# Patient Record
Sex: Female | Born: 1940 | Race: White | Hispanic: No | Smoking: Current every day smoker
Health system: Southern US, Community
[De-identification: ages and names within clinical notes are randomized; demographics above are authoritative.]

## PROBLEM LIST (undated history)

## (undated) ENCOUNTER — Ambulatory Visit: Admission: EM | Source: Home / Self Care

## (undated) DIAGNOSIS — I10 Essential (primary) hypertension: Secondary | ICD-10-CM

## (undated) DIAGNOSIS — E119 Type 2 diabetes mellitus without complications: Secondary | ICD-10-CM

## (undated) DIAGNOSIS — M539 Dorsopathy, unspecified: Secondary | ICD-10-CM

## (undated) DIAGNOSIS — K219 Gastro-esophageal reflux disease without esophagitis: Secondary | ICD-10-CM

## (undated) DIAGNOSIS — E785 Hyperlipidemia, unspecified: Secondary | ICD-10-CM

## (undated) DIAGNOSIS — J449 Chronic obstructive pulmonary disease, unspecified: Secondary | ICD-10-CM

## (undated) DIAGNOSIS — D649 Anemia, unspecified: Secondary | ICD-10-CM

## (undated) HISTORY — DX: Gastro-esophageal reflux disease without esophagitis: K21.9

## (undated) MED FILL — Ferumoxytol Inj 510 MG/17ML (30 MG/ML) (Elemental Fe): INTRAVENOUS | Qty: 17 | Status: AC

---

## 2005-08-17 ENCOUNTER — Ambulatory Visit: Payer: Self-pay | Admitting: Family Medicine

## 2007-04-01 ENCOUNTER — Ambulatory Visit: Payer: Self-pay | Admitting: Family Medicine

## 2008-06-04 ENCOUNTER — Ambulatory Visit: Payer: Self-pay | Admitting: Family Medicine

## 2008-10-06 ENCOUNTER — Ambulatory Visit: Payer: Self-pay | Admitting: Gastroenterology

## 2009-06-10 ENCOUNTER — Ambulatory Visit: Payer: Self-pay | Admitting: Family Medicine

## 2010-06-13 ENCOUNTER — Ambulatory Visit: Payer: Self-pay | Admitting: Family Medicine

## 2011-07-19 ENCOUNTER — Ambulatory Visit: Payer: Self-pay | Admitting: Family Medicine

## 2012-07-19 ENCOUNTER — Ambulatory Visit: Payer: Self-pay | Admitting: Family Medicine

## 2012-08-26 ENCOUNTER — Ambulatory Visit: Payer: Self-pay | Admitting: Anesthesiology

## 2012-08-26 LAB — ELECTROLYTE PANEL
Anion Gap: 8 (ref 7–16)
Chloride: 101 mmol/L (ref 98–107)
Co2: 30 mmol/L (ref 21–32)
Sodium: 139 mmol/L (ref 136–145)

## 2012-08-28 ENCOUNTER — Ambulatory Visit: Payer: Self-pay | Admitting: Podiatry

## 2013-08-27 ENCOUNTER — Ambulatory Visit: Payer: Self-pay | Admitting: Family Medicine

## 2013-10-27 ENCOUNTER — Ambulatory Visit: Payer: Self-pay | Admitting: Gastroenterology

## 2013-10-28 LAB — PATHOLOGY REPORT

## 2014-10-09 ENCOUNTER — Ambulatory Visit: Payer: Self-pay | Admitting: Family Medicine

## 2014-12-21 DIAGNOSIS — J449 Chronic obstructive pulmonary disease, unspecified: Secondary | ICD-10-CM | POA: Insufficient documentation

## 2015-12-27 ENCOUNTER — Other Ambulatory Visit: Payer: Self-pay | Admitting: Family Medicine

## 2015-12-27 DIAGNOSIS — Z1231 Encounter for screening mammogram for malignant neoplasm of breast: Secondary | ICD-10-CM

## 2016-01-25 ENCOUNTER — Ambulatory Visit
Admission: RE | Admit: 2016-01-25 | Discharge: 2016-01-25 | Disposition: A | Discharge status: Home / Self Care | Payer: Medicare Other | Source: Ambulatory Visit | Attending: Family Medicine | Admitting: Family Medicine

## 2016-01-25 DIAGNOSIS — Z1231 Encounter for screening mammogram for malignant neoplasm of breast: Secondary | ICD-10-CM | POA: Diagnosis not present

## 2016-09-13 ENCOUNTER — Other Ambulatory Visit: Payer: Self-pay | Admitting: Gastroenterology

## 2016-09-13 DIAGNOSIS — R1084 Generalized abdominal pain: Secondary | ICD-10-CM

## 2016-09-19 ENCOUNTER — Ambulatory Visit
Admission: RE | Admit: 2016-09-19 | Discharge: 2016-09-19 | Disposition: A | Discharge status: Home / Self Care | Payer: Medicare Other | Source: Ambulatory Visit | Attending: Gastroenterology | Admitting: Gastroenterology

## 2016-09-19 ENCOUNTER — Other Ambulatory Visit
Admission: RE | Admit: 2016-09-19 | Discharge: 2016-09-19 | Disposition: A | Discharge status: Home / Self Care | Payer: Medicare Other | Source: Ambulatory Visit | Attending: Gastroenterology | Admitting: Gastroenterology

## 2016-09-19 DIAGNOSIS — K8020 Calculus of gallbladder without cholecystitis without obstruction: Secondary | ICD-10-CM | POA: Insufficient documentation

## 2016-09-19 DIAGNOSIS — R1084 Generalized abdominal pain: Secondary | ICD-10-CM | POA: Insufficient documentation

## 2016-09-19 DIAGNOSIS — M419 Scoliosis, unspecified: Secondary | ICD-10-CM | POA: Diagnosis not present

## 2016-09-19 DIAGNOSIS — I700 Atherosclerosis of aorta: Secondary | ICD-10-CM | POA: Diagnosis not present

## 2016-09-19 DIAGNOSIS — I2510 Atherosclerotic heart disease of native coronary artery without angina pectoris: Secondary | ICD-10-CM | POA: Insufficient documentation

## 2016-09-19 HISTORY — DX: Essential (primary) hypertension: I10

## 2016-09-19 LAB — BASIC METABOLIC PANEL
ANION GAP: 8 (ref 5–15)
BUN: 15 mg/dL (ref 6–20)
CALCIUM: 9.3 mg/dL (ref 8.9–10.3)
CO2: 27 mmol/L (ref 22–32)
Chloride: 99 mmol/L — ABNORMAL LOW (ref 101–111)
Creatinine, Ser: 0.7 mg/dL (ref 0.44–1.00)
Glucose, Bld: 106 mg/dL — ABNORMAL HIGH (ref 65–99)
POTASSIUM: 3.7 mmol/L (ref 3.5–5.1)
SODIUM: 134 mmol/L — AB (ref 135–145)

## 2016-09-19 MED ORDER — IOPAMIDOL (ISOVUE-300) INJECTION 61%
100.0000 mL | Freq: Once | INTRAVENOUS | Status: AC | PRN
  Administered 2016-09-19: 100 mL via INTRAVENOUS

## 2017-02-08 ENCOUNTER — Other Ambulatory Visit: Payer: Self-pay | Admitting: Medical Oncology

## 2017-02-08 DIAGNOSIS — Z780 Asymptomatic menopausal state: Secondary | ICD-10-CM

## 2017-02-12 ENCOUNTER — Other Ambulatory Visit: Payer: Self-pay | Admitting: Medical Oncology

## 2017-02-12 DIAGNOSIS — Z1231 Encounter for screening mammogram for malignant neoplasm of breast: Secondary | ICD-10-CM

## 2017-02-22 ENCOUNTER — Ambulatory Visit
Admission: RE | Admit: 2017-02-22 | Discharge: 2017-02-22 | Disposition: A | Discharge status: Home / Self Care | Payer: Medicare Other | Source: Ambulatory Visit | Attending: Medical Oncology | Admitting: Medical Oncology

## 2017-02-22 DIAGNOSIS — Z780 Asymptomatic menopausal state: Secondary | ICD-10-CM | POA: Insufficient documentation

## 2017-02-22 DIAGNOSIS — Z1231 Encounter for screening mammogram for malignant neoplasm of breast: Secondary | ICD-10-CM | POA: Insufficient documentation

## 2017-12-06 ENCOUNTER — Encounter: Payer: Self-pay | Admitting: *Deleted

## 2017-12-07 ENCOUNTER — Ambulatory Visit: Payer: Medicare Other | Admitting: Anesthesiology

## 2017-12-07 ENCOUNTER — Encounter
Admission: RE | Disposition: A | Discharge status: Home / Self Care | Payer: Self-pay | Source: Ambulatory Visit | Attending: Gastroenterology

## 2017-12-07 ENCOUNTER — Encounter: Payer: Self-pay | Admitting: Anesthesiology

## 2017-12-07 ENCOUNTER — Ambulatory Visit
Admission: RE | Admit: 2017-12-07 | Discharge: 2017-12-07 | Disposition: A | Discharge status: Home / Self Care | Payer: Medicare Other | Source: Ambulatory Visit | Attending: Gastroenterology | Admitting: Gastroenterology

## 2017-12-07 DIAGNOSIS — R634 Abnormal weight loss: Secondary | ICD-10-CM | POA: Diagnosis not present

## 2017-12-07 DIAGNOSIS — Z79899 Other long term (current) drug therapy: Secondary | ICD-10-CM | POA: Insufficient documentation

## 2017-12-07 DIAGNOSIS — K219 Gastro-esophageal reflux disease without esophagitis: Secondary | ICD-10-CM | POA: Diagnosis not present

## 2017-12-07 DIAGNOSIS — F17200 Nicotine dependence, unspecified, uncomplicated: Secondary | ICD-10-CM | POA: Diagnosis not present

## 2017-12-07 DIAGNOSIS — Z7982 Long term (current) use of aspirin: Secondary | ICD-10-CM | POA: Insufficient documentation

## 2017-12-07 DIAGNOSIS — J449 Chronic obstructive pulmonary disease, unspecified: Secondary | ICD-10-CM | POA: Insufficient documentation

## 2017-12-07 DIAGNOSIS — Z800 Family history of malignant neoplasm of digestive organs: Secondary | ICD-10-CM | POA: Insufficient documentation

## 2017-12-07 DIAGNOSIS — K2950 Unspecified chronic gastritis without bleeding: Secondary | ICD-10-CM | POA: Diagnosis not present

## 2017-12-07 DIAGNOSIS — Z1211 Encounter for screening for malignant neoplasm of colon: Secondary | ICD-10-CM | POA: Diagnosis not present

## 2017-12-07 DIAGNOSIS — E785 Hyperlipidemia, unspecified: Secondary | ICD-10-CM | POA: Insufficient documentation

## 2017-12-07 DIAGNOSIS — K5730 Diverticulosis of large intestine without perforation or abscess without bleeding: Secondary | ICD-10-CM | POA: Diagnosis not present

## 2017-12-07 DIAGNOSIS — Z86010 Personal history of colonic polyps: Secondary | ICD-10-CM | POA: Insufficient documentation

## 2017-12-07 DIAGNOSIS — K621 Rectal polyp: Secondary | ICD-10-CM | POA: Diagnosis not present

## 2017-12-07 DIAGNOSIS — I10 Essential (primary) hypertension: Secondary | ICD-10-CM | POA: Insufficient documentation

## 2017-12-07 DIAGNOSIS — K259 Gastric ulcer, unspecified as acute or chronic, without hemorrhage or perforation: Secondary | ICD-10-CM | POA: Diagnosis not present

## 2017-12-07 HISTORY — DX: Chronic obstructive pulmonary disease, unspecified: J44.9

## 2017-12-07 HISTORY — PX: ESOPHAGOGASTRODUODENOSCOPY (EGD) WITH PROPOFOL: SHX5813

## 2017-12-07 HISTORY — DX: Hyperlipidemia, unspecified: E78.5

## 2017-12-07 HISTORY — PX: COLONOSCOPY WITH PROPOFOL: SHX5780

## 2017-12-07 SURGERY — ESOPHAGOGASTRODUODENOSCOPY (EGD) WITH PROPOFOL
Anesthesia: General

## 2017-12-07 MED ORDER — FENTANYL CITRATE (PF) 100 MCG/2ML IJ SOLN
INTRAMUSCULAR | Status: AC
  Filled 2017-12-07: qty 2

## 2017-12-07 MED ORDER — EPHEDRINE SULFATE 50 MG/ML IJ SOLN
INTRAMUSCULAR | Status: DC | PRN
  Administered 2017-12-07 (×2): 10 mg via INTRAVENOUS

## 2017-12-07 MED ORDER — BUTAMBEN-TETRACAINE-BENZOCAINE 2-2-14 % EX AERO
INHALATION_SPRAY | CUTANEOUS | Status: DC
Start: 2017-12-07 — End: 2017-12-07
  Filled 2017-12-07: qty 5

## 2017-12-07 MED ORDER — PHENYLEPHRINE HCL 10 MG/ML IJ SOLN
INTRAMUSCULAR | Status: AC
  Filled 2017-12-07: qty 1

## 2017-12-07 MED ORDER — PROPOFOL 10 MG/ML IV BOLUS
INTRAVENOUS | Status: AC
  Filled 2017-12-07: qty 20

## 2017-12-07 MED ORDER — PHENYLEPHRINE HCL 10 MG/ML IJ SOLN
INTRAMUSCULAR | Status: DC | PRN
  Administered 2017-12-07: 200 ug via INTRAVENOUS
  Administered 2017-12-07 (×2): 100 ug via INTRAVENOUS
  Administered 2017-12-07: 200 ug via INTRAVENOUS
  Administered 2017-12-07: 100 ug via INTRAVENOUS
  Administered 2017-12-07: 200 ug via INTRAVENOUS
  Administered 2017-12-07 (×5): 100 ug via INTRAVENOUS
  Administered 2017-12-07: 200 ug via INTRAVENOUS
  Administered 2017-12-07 (×2): 100 ug via INTRAVENOUS

## 2017-12-07 MED ORDER — LIDOCAINE HCL (PF) 2 % IJ SOLN
INTRAMUSCULAR | Status: AC
  Filled 2017-12-07: qty 10

## 2017-12-07 MED ORDER — SODIUM CHLORIDE 0.9 % IV SOLN: INTRAVENOUS | Status: DC

## 2017-12-07 MED ORDER — MIDAZOLAM HCL 2 MG/2ML IJ SOLN
INTRAMUSCULAR | Status: DC | PRN
  Administered 2017-12-07: 2 mg via INTRAVENOUS

## 2017-12-07 MED ORDER — FENTANYL CITRATE (PF) 100 MCG/2ML IJ SOLN: 25.0000 ug | INTRAMUSCULAR | Status: DC | PRN

## 2017-12-07 MED ORDER — SODIUM CHLORIDE 0.9 % IV SOLN
INTRAVENOUS | Status: DC
  Administered 2017-12-07: 1000 mL via INTRAVENOUS

## 2017-12-07 MED ORDER — FENTANYL CITRATE (PF) 100 MCG/2ML IJ SOLN
INTRAMUSCULAR | Status: DC | PRN
  Administered 2017-12-07: 50 ug via INTRAVENOUS

## 2017-12-07 MED ORDER — LIDOCAINE HCL (PF) 1 % IJ SOLN
INTRAMUSCULAR | Status: AC
  Administered 2017-12-07: 0.3 mL via INTRADERMAL
  Filled 2017-12-07: qty 2

## 2017-12-07 MED ORDER — EPHEDRINE SULFATE 50 MG/ML IJ SOLN
INTRAMUSCULAR | Status: AC
  Filled 2017-12-07: qty 1

## 2017-12-07 MED ORDER — PROPOFOL 500 MG/50ML IV EMUL
INTRAVENOUS | Status: AC
  Filled 2017-12-07: qty 50

## 2017-12-07 MED ORDER — PROPOFOL 500 MG/50ML IV EMUL
INTRAVENOUS | Status: DC | PRN
  Administered 2017-12-07: 120 ug/kg/min via INTRAVENOUS

## 2017-12-07 MED ORDER — LIDOCAINE HCL (PF) 1 % IJ SOLN
2.0000 mL | Freq: Once | INTRAMUSCULAR | Status: AC
  Administered 2017-12-07: 0.3 mL via INTRADERMAL

## 2017-12-07 MED ORDER — MIDAZOLAM HCL 2 MG/2ML IJ SOLN
INTRAMUSCULAR | Status: AC
  Filled 2017-12-07: qty 2

## 2017-12-07 MED ORDER — ONDANSETRON HCL 4 MG/2ML IJ SOLN: 4.0000 mg | Freq: Once | INTRAMUSCULAR | Status: DC | PRN

## 2017-12-07 NOTE — Anesthesia Post-op Follow-up Note (Signed)
Anesthesia QCDR form completed.        

## 2017-12-07 NOTE — H&P (Signed)
Outpatient short stay form Pre-procedure 12/07/2017 9:42 AM Lollie Sails MD   Primary Physician: Colbert Ewing PA  Reason for visit:  EGD and colonoscopy  History of present illness:  Patient is a 76 year old female presenting today with complaint of reflux that seems not responsive to a proton pump inhibitor as well as weight loss. She has lost about 10 pounds over the period past year. Also she has a family history of colon cancer primary relative, mother and personal history of adenomatous colon polyps. Today for procedures as noted above. She tolerated her prep well. She takes no blood thinning agents with the exception of an 81 mg aspirin.    Current Facility-Administered Medications:  .  0.9 %  sodium chloride infusion, , Intravenous, Continuous, Lollie Sails, MD, Last Rate: 20 mL/hr at 12/07/17 0910, 1,000 mL at 12/07/17 0910 .  0.9 %  sodium chloride infusion, , Intravenous, Continuous, Lollie Sails, MD .  butamben-tetracaine-benzocaine (CETACAINE) 01-19-13 % spray, , , ,   Medications Prior to Admission  Medication Sig Dispense Refill Last Dose  . albuterol (VENTOLIN HFA) 108 (90 Base) MCG/ACT inhaler Inhale 2 puffs into the lungs every 6 (six) hours as needed for wheezing or shortness of breath.     Marland Kitchen aspirin EC 81 MG tablet Take 81 mg by mouth daily.   12/02/2017 at 1800  . CALCIUM CARBONATE-VIT D-MIN PO Take 1 tablet by mouth 2 (two) times daily.     . Cyanocobalamin (VITAMIN B 12 PO) Take 1,000 mcg by mouth daily.     Marland Kitchen GARLIC PO Take by mouth.     . Glucosamine-Chondroitin (GLUCOSAMINE CHONDR COMPLEX PO) Take by mouth.     Marland Kitchen lisinopril-hydrochlorothiazide (PRINZIDE,ZESTORETIC) 20-12.5 MG tablet Take 1 tablet by mouth daily.   12/07/2017 at 2100  . omega-3 acid ethyl esters (LOVAZA) 1 g capsule Take 4 capsules by mouth daily.     Marland Kitchen omeprazole (PRILOSEC) 20 MG capsule Take 20 mg by mouth daily.     . Red Yeast Rice 600 MG CAPS Take by mouth.        Allergies   Allergen Reactions  . Simvastatin      Past Medical History:  Diagnosis Date  . COPD (chronic obstructive pulmonary disease) ([place])   . Hyperlipidemia   . Hypertension     Review of systems:      Physical Exam    Heart and lungs: Regular rate and rhythm without rub or gallop, lungs are bilaterally clear.    HEENT: Normocephalic atraumatic eyes are anicteric    Other:     Pertinant exam for procedure: Soft nontender nondistended bowel sounds positive normoactive.    Planned proceedures: EGD, colonoscopy and indicated procedures. I have discussed the risks benefits and complications of procedures to include not limited to bleeding, infection, perforation and the risk of sedation and the patient wishes to proceed.    Lollie Sails, MD Gastroenterology 12/07/2017  9:42 AM

## 2017-12-07 NOTE — Op Note (Signed)
[facility] Gastroenterology Patient Name: Angelica Walker Procedure Date: 12/07/2017 9:43 AM MRN: [id number] Account #: [id number] Date of Birth: [birth year] Admit Type: Outpatient Age: 76 Room: [facility] ENDO ROOM 1 Gender: Female Note Status: Finalized Procedure:            Colonoscopy Indications:          Screening for colorectal malignant neoplasm, Personal                        history of colonic polyps Providers:            Lollie Sails, MD Referring MD:         Satira Anis. Plonk, MD (Referring MD) Medicines:            Monitored Anesthesia Care Complications:        No immediate complications. Procedure:            Pre-Anesthesia Assessment:                       - ASA Grade Assessment: III - A patient with severe                        systemic disease.                       After obtaining informed consent, the colonoscope was                        passed under direct vision. Throughout the procedure,                        the patient's blood pressure, pulse, and oxygen                        saturations were monitored continuously. The                        Colonoscope was introduced through the anus and                        advanced to the the cecum, identified by appendiceal                        orifice and ileocecal valve. The colonoscopy was                        unusually difficult due to restricted mobility of the                        colon and a tortuous colon. Successful completion of                        the procedure was aided by changing the patient to a                        supine position, changing the patient to a prone                        position and using manual pressure. The patient  tolerated the procedure well. The quality of the bowel                        preparation was good. Findings:      Three sessile polyps were found in the rectum. The polyps were 1 to 2 mm       in size. These polyps  were removed with a cold biopsy forceps. Resection       and retrieval were complete.      A few small and medium-mouthed diverticula were found in the sigmoid       colon.      The sigmoid colon, descending colon, splenic flexure and transverse       colon were significantly tortuous.      The digital rectal exam findings include High pressure anal ring, no       actual stenosis noted. Impression:           - Three 1 to 2 mm polyps in the rectum, removed with a                        cold biopsy forceps. Resected and retrieved.                       - Diverticulosis in the sigmoid colon.                       - Tortuous colon.                       - High pressure anal ring, no actual stenosis noted.                        found on digital rectal exam. Recommendation:       - Discharge patient to home.                       - Await pathology results.                       - Return to GI clinic in 4 weeks. Procedure Code(s):    --- Professional ---                       [phone], Colonoscopy, flexible; with biopsy, single or                        multiple Diagnosis Code(s):    --- Professional ---                       Z12.11, Encounter for screening for malignant neoplasm                        of colon                       K62.1, Rectal polyp                       Z86.010, Personal history of colonic polyps                       K57.30, Diverticulosis of large intestine without  perforation or abscess without bleeding                       Q43.8, Other specified congenital malformations of                        intestine CPT copyright 2016 American Medical Association. All rights reserved. The codes documented in this report are preliminary and upon coder review may  be revised to meet current compliance requirements. Lollie Sails, MD 12/07/2017 10:55:32 AM This report has been signed electronically. Number of Addenda: 0 Note Initiated On: 12/07/2017  9:43 AM Scope Withdrawal Time: 0 hours 7 minutes 57 seconds  Total Procedure Duration: 0 hours 28 minutes 1 second       [facility]

## 2017-12-07 NOTE — Anesthesia Preprocedure Evaluation (Addendum)
Anesthesia Evaluation  Patient identified by MRN, date of birth, ID band Patient awake    Reviewed: Allergy & Precautions, NPO status , Patient's Chart, lab work & pertinent test results  Airway Mallampati: II  TM Distance: >3 FB     Dental   Pulmonary COPD, Current Smoker,    Pulmonary exam normal        Cardiovascular hypertension, Pt. on medications Normal cardiovascular exam     Neuro/Psych negative neurological ROS  negative psych ROS   GI/Hepatic GERD  Medicated and Controlled,  Endo/Other    Renal/GU   negative genitourinary   Musculoskeletal   Abdominal Normal abdominal exam  (+)   Peds negative pediatric ROS (+)  Hematology   Anesthesia Other Findings   Reproductive/Obstetrics                            Anesthesia Physical Anesthesia Plan  ASA: III  Anesthesia Plan: General   Post-op Pain Management:    Induction: Intravenous  PONV Risk Score and Plan:   Airway Management Planned: Nasal Cannula  Additional Equipment:   Intra-op Plan:   Post-operative Plan:   Informed Consent: I have reviewed the patients History and Physical, chart, labs and discussed the procedure including the risks, benefits and alternatives for the proposed anesthesia with the patient or authorized representative who has indicated his/her understanding and acceptance.   Dental advisory given  Plan Discussed with: CRNA and Surgeon  Anesthesia Plan Comments:         Anesthesia Quick Evaluation

## 2017-12-07 NOTE — Anesthesia Procedure Notes (Signed)
Performed by: Cook-Martin, Takuya Lariccia Pre-anesthesia Checklist: Patient identified, Emergency Drugs available, Suction available, Patient being monitored and Timeout performed Patient Re-evaluated:Patient Re-evaluated prior to induction Oxygen Delivery Method: Nasal cannula Preoxygenation: Pre-oxygenation with 100% oxygen Induction Type: IV induction Airway Equipment and Method: Bite block Placement Confirmation: CO2 detector and positive ETCO2       

## 2017-12-07 NOTE — Anesthesia Procedure Notes (Signed)
Performed by: Cook-Martin, Ivyanna Sibert Pre-anesthesia Checklist: Patient identified, Emergency Drugs available, Suction available, Patient being monitored and Timeout performed Patient Re-evaluated:Patient Re-evaluated prior to induction Oxygen Delivery Method: Nasal cannula Preoxygenation: Pre-oxygenation with 100% oxygen Induction Type: IV induction Airway Equipment and Method: Bite block Placement Confirmation: CO2 detector and positive ETCO2       

## 2017-12-07 NOTE — Anesthesia Postprocedure Evaluation (Signed)
Anesthesia Post Note  Patient: Angelica Walker  Procedure(s) Performed: ESOPHAGOGASTRODUODENOSCOPY (EGD) WITH PROPOFOL (N/A ) COLONOSCOPY WITH PROPOFOL (N/A )  Patient location during evaluation: PACU Anesthesia Type: General Level of consciousness: awake and alert and oriented Pain management: pain level controlled Vital Signs Assessment: post-procedure vital signs reviewed and stable Respiratory status: spontaneous breathing Cardiovascular status: blood pressure returned to baseline Anesthetic complications: no Comments: Changed BP cuff to right arm with normal BPs     Last Vitals:  Vitals:   12/07/17 1201 12/07/17 1211  BP: (!) 128/54 (!) 106/53  Pulse: 83 80  Resp: 14 19  Temp:    SpO2: 100% 99%    Last Pain:  Vitals:   12/07/17 1101  TempSrc: Tympanic                 Livana Yerian

## 2017-12-07 NOTE — Op Note (Signed)
[facility] Gastroenterology Patient Name: Angelica Walker Procedure Date: 12/07/2017 9:43 AM MRN: [id number] Account #: [id number] Date of Birth: 1941/09/07 Admit Type: Outpatient Age: 76 Room: [facility] ENDO ROOM 1 Gender: Female Note Status: Finalized Procedure:            Upper GI endoscopy Indications:          Gastro-esophageal reflux disease, Weight loss Providers:            Lollie Sails, MD Referring MD:         Satira Anis. Plonk, MD (Referring MD) Medicines:            Monitored Anesthesia Care Complications:        No immediate complications. Procedure:            Pre-Anesthesia Assessment:                       - ASA Grade Assessment: III - A patient with severe                        systemic disease.                       After obtaining informed consent, the endoscope was                        passed under direct vision. Throughout the procedure,                        the patient's blood pressure, pulse, and oxygen                        saturations were monitored continuously. The Endoscope                        was introduced through the mouth, and advanced to the                        third part of duodenum. The upper GI endoscopy was                        accomplished without difficulty. The patient tolerated                        the procedure well. Findings:      The Z-line was variable. Biopsies were taken with a cold forceps for       histology.      One non-obstructing non-bleeding cratered gastric ulcer of significant       severity with no stigmata of bleeding was found on the posterior wall of       the gastric body. The lesion was 16 mm in largest dimension. Biopsies       were taken with a cold forceps for histology.      Four non-bleeding cratered and superficial gastric ulcers with no       stigmata of bleeding were found in the gastric fundus, on the anterior       wall of the gastric body and at the incisura. The largest  lesion was 4       mm in largest dimension.      Biopsies were taken with a cold forceps in the  gastric body and in the       gastric antrum for histology.      The examined duodenum was normal.      Retroflexion evaluation as noted.      The exam of the esophagus was otherwise normal. Impression:           - Z-line variable. Biopsied.                       - Non-obstructing non-bleeding gastric ulcer with no                        stigmata of bleeding. Biopsied.                       - Non-bleeding gastric ulcers with no stigmata of                        bleeding.                       - Normal examined duodenum.                       - Biopsies were taken with a cold forceps for histology                        in the gastric body and in the gastric antrum. Recommendation:       - Use Protonix (pantoprazole) 40 mg PO BID daily.                       - Use sucralfate tablets 1 gram PO QID for 1 month.                       - Low residue diet.                       - Return to GI clinic in 2 weeks.                       - Await pathology results.                       - Repeat upper endoscopy in 7 weeks to check healing. Procedure Code(s):    --- Professional ---                       [phone], Esophagogastroduodenoscopy, flexible, transoral;                        with biopsy, single or multiple Diagnosis Code(s):    --- Professional ---                       K22.8, Other specified diseases of esophagus                       K25.9, Gastric ulcer, unspecified as acute or chronic,                        without hemorrhage or perforation  K21.9, Gastro-esophageal reflux disease without                        esophagitis                       R63.4, Abnormal weight loss CPT copyright 2016 American Medical Association. All rights reserved. The codes documented in this report are preliminary and upon coder review may  be revised to meet current compliance  requirements. Lollie Sails, MD 12/07/2017 10:18:20 AM This report has been signed electronically. Number of Addenda: 0 Note Initiated On: 12/07/2017 9:43 AM      [facility]

## 2017-12-07 NOTE — Transfer of Care (Signed)
Immediate Anesthesia Transfer of Care Note  Patient: Angelica Walker  Procedure(s) Performed: ESOPHAGOGASTRODUODENOSCOPY (EGD) WITH PROPOFOL (N/A ) COLONOSCOPY WITH PROPOFOL (N/A )  Patient Location: PACU  Anesthesia Type:General  Level of Consciousness: awake and sedated  Airway & Oxygen Therapy: Patient Spontanous Breathing and Patient connected to nasal cannula oxygen  Post-op Assessment: Report given to RN and Post -op Vital signs reviewed and stable  Post vital signs: Reviewed and stable  Last Vitals:  Vitals:   12/07/17 0854  BP: 97/64  Pulse: (!) 105  Resp: 17  Temp: (!) 36 C  SpO2: 100%    Last Pain:  Vitals:   12/07/17 0854  TempSrc: Tympanic         Complications: No apparent anesthesia complications

## 2017-12-12 ENCOUNTER — Encounter: Payer: Self-pay | Admitting: Gastroenterology

## 2017-12-12 LAB — SURGICAL PATHOLOGY

## 2017-12-19 ENCOUNTER — Ambulatory Visit: Payer: Medicare Other | Admitting: Family Medicine

## 2017-12-19 ENCOUNTER — Encounter: Payer: Self-pay | Admitting: Family Medicine

## 2017-12-19 VITALS — BP 138/84 | HR 78 | Resp 16 | Ht 65.0 in | Wt 138.0 lb

## 2017-12-19 DIAGNOSIS — K259 Gastric ulcer, unspecified as acute or chronic, without hemorrhage or perforation: Secondary | ICD-10-CM

## 2017-12-19 DIAGNOSIS — E538 Deficiency of other specified B group vitamins: Secondary | ICD-10-CM

## 2017-12-19 DIAGNOSIS — E785 Hyperlipidemia, unspecified: Secondary | ICD-10-CM

## 2017-12-19 DIAGNOSIS — J449 Chronic obstructive pulmonary disease, unspecified: Secondary | ICD-10-CM

## 2017-12-19 DIAGNOSIS — I10 Essential (primary) hypertension: Secondary | ICD-10-CM | POA: Diagnosis not present

## 2017-12-19 DIAGNOSIS — M5136 Other intervertebral disc degeneration, lumbar region: Secondary | ICD-10-CM | POA: Diagnosis not present

## 2017-12-19 DIAGNOSIS — R7303 Prediabetes: Secondary | ICD-10-CM

## 2017-12-19 DIAGNOSIS — F17200 Nicotine dependence, unspecified, uncomplicated: Secondary | ICD-10-CM

## 2017-12-19 DIAGNOSIS — M51369 Other intervertebral disc degeneration, lumbar region without mention of lumbar back pain or lower extremity pain: Secondary | ICD-10-CM

## 2017-12-19 NOTE — Patient Instructions (Signed)
Stop aspirin and Aleve. Begin Tylenol extra-strength (500 mg) two tablets twice daily.

## 2017-12-20 ENCOUNTER — Other Ambulatory Visit: Payer: Self-pay | Admitting: Family Medicine

## 2017-12-20 ENCOUNTER — Other Ambulatory Visit (INDEPENDENT_AMBULATORY_CARE_PROVIDER_SITE_OTHER): Payer: Medicare Other

## 2017-12-20 ENCOUNTER — Encounter: Payer: Self-pay | Admitting: Family Medicine

## 2017-12-20 DIAGNOSIS — R350 Frequency of micturition: Secondary | ICD-10-CM | POA: Diagnosis not present

## 2017-12-20 DIAGNOSIS — D500 Iron deficiency anemia secondary to blood loss (chronic): Secondary | ICD-10-CM | POA: Insufficient documentation

## 2017-12-20 LAB — COMPREHENSIVE METABOLIC PANEL
ALK PHOS: 87 IU/L (ref 39–117)
ALT: 8 IU/L (ref 0–32)
AST: 18 IU/L (ref 0–40)
Albumin/Globulin Ratio: 1.5 (ref 1.2–2.2)
Albumin: 4.3 g/dL (ref 3.5–4.8)
BUN/Creatinine Ratio: 9 — ABNORMAL LOW (ref 12–28)
BUN: 6 mg/dL — AB (ref 8–27)
Bilirubin Total: 0.4 mg/dL (ref 0.0–1.2)
CO2: 24 mmol/L (ref 20–29)
CREATININE: 0.67 mg/dL (ref 0.57–1.00)
Calcium: 9.4 mg/dL (ref 8.7–10.3)
Chloride: 97 mmol/L (ref 96–106)
GFR calc Af Amer: 100 mL/min/{1.73_m2} (ref >59)
GFR, EST NON AFRICAN AMERICAN: 87 mL/min/{1.73_m2} (ref >59)
GLUCOSE: 111 mg/dL — AB (ref 65–99)
Globulin, Total: 2.8 g/dL (ref 1.5–4.5)
Potassium: 3.9 mmol/L (ref 3.5–5.2)
SODIUM: 136 mmol/L (ref 134–144)
Total Protein: 7.1 g/dL (ref 6.0–8.5)

## 2017-12-20 LAB — POCT URINALYSIS DIPSTICK
Bilirubin, UA: NEGATIVE
Blood, UA: NEGATIVE
Glucose, UA: NEGATIVE
KETONES UA: NEGATIVE
LEUKOCYTES UA: NEGATIVE
NITRITE UA: NEGATIVE
PROTEIN UA: NEGATIVE
SPEC GRAV UA: 1.015 (ref 1.010–1.025)
UROBILINOGEN UA: 0.2 U/dL
pH, UA: 6 (ref 5.0–8.0)

## 2017-12-20 LAB — TSH: TSH: 1.9 u[IU]/mL (ref 0.450–4.500)

## 2017-12-20 LAB — CBC
HEMOGLOBIN: 7.3 g/dL — AB (ref 11.1–15.9)
Hematocrit: 25.5 % — ABNORMAL LOW (ref 34.0–46.6)
MCH: 18.9 pg — AB (ref 26.6–33.0)
MCHC: 28.6 g/dL — ABNORMAL LOW (ref 31.5–35.7)
MCV: 66 fL — AB (ref 79–97)
PLATELETS: 542 10*3/uL — AB (ref 150–379)
RBC: 3.86 x10E6/uL (ref 3.77–5.28)
RDW: 18.5 % — ABNORMAL HIGH (ref 12.3–15.4)
WBC: 8.4 10*3/uL (ref 3.4–10.8)

## 2017-12-20 LAB — LIPID PANEL
CHOL/HDL RATIO: 3.5 ratio (ref 0.0–4.4)
Cholesterol, Total: 153 mg/dL (ref 100–199)
HDL: 44 mg/dL (ref >39)
LDL Calculated: 87 mg/dL (ref 0–99)
Triglycerides: 109 mg/dL (ref 0–149)
VLDL Cholesterol Cal: 22 mg/dL (ref 5–40)

## 2017-12-20 LAB — HEMOGLOBIN A1C
Est. average glucose Bld gHb Est-mCnc: 123 mg/dL
HEMOGLOBIN A1C: 5.9 % — AB (ref 4.8–5.6)

## 2017-12-20 LAB — VITAMIN B12: VITAMIN B 12: 731 pg/mL (ref 232–1245)

## 2017-12-20 MED ORDER — FERROUS SULFATE 325 (65 FE) MG PO TABS: 325.0000 mg | ORAL_TABLET | Freq: Every day | ORAL | 3 refills | Status: DC

## 2017-12-20 NOTE — Progress Notes (Signed)
Date:  12/19/2017   Name:  Angelica Walker   DOB:  1941-02-23   MRN:  [id number]  PCP:  Adline Potter, MD    Chief Complaint: Establish Care   History of Present Illness:  This is a 77 y.o. female seen for initial visit. HTN on Prinzide, prediabetes with a1c 6.0% in Feb, HLD with LDL 114 in 2017, intolerant Zocor, takes fish oil/red yeast/garlic instead, no known CVD. Had UGI/colonoscopy two weeks ago showing multiple gastric ulcers and polyps, placed on Protonix bid (only taking qd) and Carafate qid x 1 month, no current sxs, all bxs negative. Known COPD, smoker x 50 yrs, not really interested in quitting. DEXA normal 02/2017, saw optho 2018. DDD on G/C, takes Aleve prn, B12 def on supplement. Father died 84 unknown cause, mother died 68 old age, sister died 81s heart/kidney dz, bother died 30 EtOH. Tdap/pneumo imms UTD, declines flu and zoster imms, mammo neg 02/2017.  Review of Systems:  Review of Systems  Constitutional: Negative for chills and fever.  HENT: Negative for ear pain and sinus pain.   Eyes: Negative for pain.  Respiratory: Negative for cough and shortness of breath.   Cardiovascular: Negative for chest pain and leg swelling.  Gastrointestinal: Negative for abdominal pain.  Genitourinary: Negative for difficulty urinating.  Neurological: Negative for syncope and light-headedness.    Patient Active Problem List   Diagnosis Date Noted  . Prediabetes 12/19/2017  . Hyperlipidemia, unspecified 12/19/2017  . Hypertension, essential, benign 12/19/2017  . B12 deficiency 12/19/2017  . DDD (degenerative disc disease), lumbar 12/19/2017  . Multiple gastric ulcers 12/19/2017  . COPD (chronic obstructive pulmonary disease) ([place]) 12/21/2014    Prior to Admission medications   Medication Sig Start Date End Date Taking? Authorizing Provider  acetaminophen (TYLENOL) 500 MG tablet Take 1,000 mg by mouth 2 (two) times daily.   Yes [provider]  CALCIUM CARBONATE-VIT D-MIN  PO Take 1 tablet by mouth 2 (two) times daily.   Yes [provider]  Cyanocobalamin (VITAMIN B 12 PO) Take 1,000 mcg by mouth daily.   Yes [provider]  GARLIC PO Take by mouth.   Yes [provider]  Glucosamine-Chondroitin (GLUCOSAMINE CHONDR COMPLEX PO) Take by mouth.   Yes [provider]  lisinopril-hydrochlorothiazide (PRINZIDE,ZESTORETIC) 20-12.5 MG tablet Take 1 tablet by mouth daily.   Yes [provider]  omega-3 acid ethyl esters (LOVAZA) 1 g capsule Take 4 capsules by mouth daily.   Yes [provider]  pantoprazole (PROTONIX) 40 MG tablet Take 1 tablet by mouth daily. 12/07/17  Yes [provider]  Red Yeast Rice 600 MG CAPS Take by mouth.   Yes [provider]  sucralfate (CARAFATE) 1 g tablet Take 1 g by mouth 4 (four) times daily -  with meals and at bedtime.   Yes [provider]    Allergies  Allergen Reactions  . Simvastatin Other (See Comments)    Past Surgical History:  Procedure Laterality Date  . COLONOSCOPY WITH PROPOFOL N/A 12/07/2017   Procedure: COLONOSCOPY WITH PROPOFOL;  Surgeon: Lollie Sails, MD;  Location: [facility] ENDOSCOPY;  Service: Endoscopy;  Laterality: N/A;  . ESOPHAGOGASTRODUODENOSCOPY (EGD) WITH PROPOFOL N/A 12/07/2017   Procedure: ESOPHAGOGASTRODUODENOSCOPY (EGD) WITH PROPOFOL;  Surgeon: Lollie Sails, MD;  Location: [facility] ENDOSCOPY;  Service: Endoscopy;  Laterality: N/A;    Social History   Tobacco Use  . Smoking status: Current Every Day Smoker  . Smokeless tobacco: Never Used  Substance Use  Topics  . Alcohol use: No    Frequency: Never  . Drug use: No    Family History  Problem Relation Age of Onset  . Breast cancer Maternal Aunt     Medication list has been reviewed and updated.  Physical Examination: BP 138/84   Pulse 78   Resp 16   Ht 5\' 5"  (1.651 m)   Wt 138 lb (62.6 kg)   SpO2 99%   BMI 22.96 kg/m   Physical Exam   Constitutional: She is oriented to person, place, and time. She appears well-developed and well-nourished.  HENT:  Head: Normocephalic and atraumatic.  Right Ear: External ear normal.  Left Ear: External ear normal.  Nose: Nose normal.  Mouth/Throat: Oropharynx is clear and moist.  TMs clear  Eyes: Conjunctivae and EOM are normal. Pupils are equal, round, and reactive to light.  Neck: Neck supple. No thyromegaly present.  Cardiovascular: Normal rate, regular rhythm and normal heart sounds.  Poor pedal pulses R foot  Pulmonary/Chest: Effort normal.  Scattered exp wheezes  Abdominal: Soft. She exhibits no distension and no mass. There is no tenderness.  Musculoskeletal: She exhibits no edema.  Lymphadenopathy:    She has no cervical adenopathy.  Neurological: She is alert and oriented to person, place, and time. Coordination normal.  Romberg sl unsteady, gait antalgic  Skin: Skin is warm and dry.  Psychiatric: She has a normal mood and affect. Her behavior is normal.  Nursing note and vitals reviewed.   Assessment and Plan:  1. Chronic obstructive pulmonary disease, unspecified COPD type ([place]) Strongly advised d/c smoking  2. Multiple gastric ulcers Cont Protonix long-term, Carafate x 1 month, d/c asa/Aleve  3. Hypertension, essential, benign Well controlled on Prinzide - Comprehensive Metabolic Panel (CMET) - CBC - TSH  4. Hyperlipidemia, unspecified hyperlipidemia type Intolerant Zocor, on fish oil/red yeast/garlic, consider d/c given no established CVD - Lipid Profile  5. DDD (degenerative disc disease), lumbar Cont G/C, begin Tylenol 1000 mg bid  6. Prediabetes - HgB A1c  7. B12 deficiency On supplement - B12  8. Smoker - Nurse to provide smoking / tobacco cessation education  Return in about 4 weeks (around 01/16/2018).   One hour spent with patient, over half in counseling  Satira Anis. [facility]  12/20/2017

## 2018-01-17 ENCOUNTER — Ambulatory Visit: Payer: Medicare Other | Admitting: Family Medicine

## 2018-01-18 ENCOUNTER — Ambulatory Visit (INDEPENDENT_AMBULATORY_CARE_PROVIDER_SITE_OTHER): Payer: Medicare Other | Admitting: Family Medicine

## 2018-01-18 ENCOUNTER — Encounter: Payer: Self-pay | Admitting: Family Medicine

## 2018-01-18 ENCOUNTER — Other Ambulatory Visit
Admission: RE | Admit: 2018-01-18 | Discharge: 2018-01-18 | Disposition: A | Discharge status: Home / Self Care | Payer: Medicare Other | Source: Ambulatory Visit | Attending: Family Medicine | Admitting: Family Medicine

## 2018-01-18 VITALS — BP 141/80 | HR 94 | Resp 16 | Ht 65.0 in | Wt 134.4 lb

## 2018-01-18 DIAGNOSIS — M5136 Other intervertebral disc degeneration, lumbar region: Secondary | ICD-10-CM | POA: Diagnosis not present

## 2018-01-18 DIAGNOSIS — E538 Deficiency of other specified B group vitamins: Secondary | ICD-10-CM

## 2018-01-18 DIAGNOSIS — D500 Iron deficiency anemia secondary to blood loss (chronic): Secondary | ICD-10-CM

## 2018-01-18 DIAGNOSIS — J449 Chronic obstructive pulmonary disease, unspecified: Secondary | ICD-10-CM

## 2018-01-18 DIAGNOSIS — R7303 Prediabetes: Secondary | ICD-10-CM

## 2018-01-18 DIAGNOSIS — G2581 Restless legs syndrome: Secondary | ICD-10-CM | POA: Diagnosis not present

## 2018-01-18 DIAGNOSIS — I10 Essential (primary) hypertension: Secondary | ICD-10-CM

## 2018-01-18 DIAGNOSIS — K259 Gastric ulcer, unspecified as acute or chronic, without hemorrhage or perforation: Secondary | ICD-10-CM | POA: Diagnosis not present

## 2018-01-18 LAB — FERRITIN: Ferritin: 9 ng/mL — ABNORMAL LOW (ref 11–307)

## 2018-01-18 LAB — CBC
HCT: 27.6 % — ABNORMAL LOW (ref 35.0–47.0)
HEMOGLOBIN: 8.7 g/dL — AB (ref 12.0–16.0)
MCH: 20.4 pg — ABNORMAL LOW (ref 26.0–34.0)
MCHC: 31.6 g/dL — ABNORMAL LOW (ref 32.0–36.0)
MCV: 64.4 fL — ABNORMAL LOW (ref 80.0–100.0)
Platelets: 475 10*3/uL — ABNORMAL HIGH (ref 150–440)
RBC: 4.28 MIL/uL (ref 3.80–5.20)
RDW: 24.8 % — AB (ref 11.5–14.5)
WBC: 8.6 10*3/uL (ref 3.6–11.0)

## 2018-01-18 LAB — IRON AND TIBC
Iron: 13 ug/dL — ABNORMAL LOW (ref 28–170)
SATURATION RATIOS: 3 % — AB (ref 10.4–31.8)
TIBC: 519 ug/dL — AB (ref 250–450)
UIBC: 506 ug/dL

## 2018-01-18 MED ORDER — GABAPENTIN 100 MG PO CAPS: 100.0000 mg | ORAL_CAPSULE | Freq: Every day | ORAL | 2 refills | Status: DC

## 2018-01-18 MED ORDER — LISINOPRIL-HYDROCHLOROTHIAZIDE 20-12.5 MG PO TABS: 1.0000 | ORAL_TABLET | Freq: Every day | ORAL | 3 refills | Status: DC

## 2018-01-18 NOTE — Progress Notes (Signed)
Date:  01/18/2018   Name:  Angelica Walker   DOB:  20-Oct-1941   MRN:  [id number]  PCP:  Adline Potter, MD    Chief Complaint: COPD   History of Present Illness:  This is a 77 y.o. female seen for one month f/u from initial visit. PUD off Carafate, on Protonix, no recurrent sxs. Blood work showed anemia, taking iron supp 1/2 pill qod as upsets stomach. HTN on Prinzide (needs refill), DDD bothering more, also c/o RLS at night. Had transient episode L sided numbness since last visit, speech not affected, still smoking.  Review of Systems:  Review of Systems  Constitutional: Negative for chills and fever.  Respiratory: Negative for cough and shortness of breath.   Cardiovascular: Negative for chest pain and leg swelling.  Genitourinary: Negative for difficulty urinating.  Neurological: Negative for syncope and light-headedness.    Patient Active Problem List   Diagnosis Date Noted  . RLS (restless legs syndrome) 01/18/2018  . Anemia due to blood loss 12/20/2017  . Prediabetes 12/19/2017  . Hyperlipidemia, unspecified 12/19/2017  . Hypertension, essential, benign 12/19/2017  . B12 deficiency 12/19/2017  . DDD (degenerative disc disease), lumbar 12/19/2017  . Multiple gastric ulcers 12/19/2017  . COPD (chronic obstructive pulmonary disease) ([place]) 12/21/2014    Prior to Admission medications   Medication Sig Start Date End Date Taking? Authorizing Provider  acetaminophen (TYLENOL) 500 MG tablet Take 1,000 mg by mouth 2 (two) times daily.   Yes [provider]  Cyanocobalamin (VITAMIN B 12 PO) Take 1,000 mcg by mouth daily.   Yes [provider]  ferrous sulfate 325 (65 FE) MG tablet Take 1 tablet (325 mg total) by mouth daily. 12/20/17  Yes Venda Dice, Gwyndolyn Saxon, MD  Glucosamine-Chondroitin (GLUCOSAMINE CHONDR COMPLEX PO) Take by mouth.   Yes [provider]  lisinopril-hydrochlorothiazide (PRINZIDE,ZESTORETIC) 20-12.5 MG tablet Take 1 tablet by mouth daily. 01/18/18   Yes Vercie Pokorny, Gwyndolyn Saxon, MD  pantoprazole (PROTONIX) 40 MG tablet Take 1 tablet by mouth daily. 12/07/17  Yes [provider]  gabapentin (NEURONTIN) 100 MG capsule Take 1 capsule (100 mg total) by mouth at bedtime. 01/18/18   Adline Potter, MD    Allergies  Allergen Reactions  . Simvastatin Other (See Comments)    Past Surgical History:  Procedure Laterality Date  . COLONOSCOPY WITH PROPOFOL N/A 12/07/2017   Procedure: COLONOSCOPY WITH PROPOFOL;  Surgeon: Lollie Sails, MD;  Location: [facility] ENDOSCOPY;  Service: Endoscopy;  Laterality: N/A;  . ESOPHAGOGASTRODUODENOSCOPY (EGD) WITH PROPOFOL N/A 12/07/2017   Procedure: ESOPHAGOGASTRODUODENOSCOPY (EGD) WITH PROPOFOL;  Surgeon: Lollie Sails, MD;  Location: [facility] ENDOSCOPY;  Service: Endoscopy;  Laterality: N/A;    Social History   Tobacco Use  . Smoking status: Current Every Day Smoker  . Smokeless tobacco: Never Used  Substance Use Topics  . Alcohol use: No    Frequency: Never  . Drug use: No    Family History  Problem Relation Age of Onset  . Breast cancer Maternal Aunt     Medication list has been reviewed and updated.  Physical Examination: BP (!) 141/80   Pulse 94   Resp 16   Ht 5\' 5"  (1.651 m)   Wt 134 lb 6.4 oz (61 kg)   SpO2 99%   BMI 22.37 kg/m   Physical Exam  Constitutional: She appears well-developed and well-nourished.  Cardiovascular: Normal rate, regular rhythm and normal heart sounds.  Pulmonary/Chest: Effort normal and breath sounds normal.  Musculoskeletal: She exhibits no edema.  Neurological: She is alert.  Skin: Skin is warm and dry.  Psychiatric: She has a normal mood and affect. Her behavior is normal.  Nursing note and vitals reviewed.   Assessment and Plan:  1. Chronic obstructive pulmonary disease, unspecified COPD type ([place]) Strongly advised smoking cessation  2. Multiple gastric ulcers Stable off Carafate, cont Protonix  3. Anemia due to blood loss Cont iron supp -  CBC - Fe+TIBC+Fer  4. Hypertension, essential, benign Marginal control today, ok last visit, refill Prinzide  5. RLS (restless legs syndrome) Trial gabapentin 100 mg qhs  6. DDD (degenerative disc disease), lumbar Cont Tylenol bid, gabapentin may help  7. Prediabetes Stable   8. B12 deficiency Well controlled on supplement  Return in about 2 months (around 03/18/2018).  Satira Anis. [facility]  01/18/2018

## 2018-01-22 DIAGNOSIS — L918 Other hypertrophic disorders of the skin: Secondary | ICD-10-CM | POA: Diagnosis not present

## 2018-01-22 DIAGNOSIS — L570 Actinic keratosis: Secondary | ICD-10-CM | POA: Diagnosis not present

## 2018-01-22 DIAGNOSIS — L578 Other skin changes due to chronic exposure to nonionizing radiation: Secondary | ICD-10-CM | POA: Diagnosis not present

## 2018-01-22 DIAGNOSIS — Z859 Personal history of malignant neoplasm, unspecified: Secondary | ICD-10-CM | POA: Diagnosis not present

## 2018-01-24 ENCOUNTER — Other Ambulatory Visit: Payer: Self-pay | Admitting: Medical Oncology

## 2018-01-24 ENCOUNTER — Other Ambulatory Visit: Payer: Self-pay | Admitting: Family Medicine

## 2018-01-24 DIAGNOSIS — Z1231 Encounter for screening mammogram for malignant neoplasm of breast: Secondary | ICD-10-CM

## 2018-02-11 DIAGNOSIS — K210 Gastro-esophageal reflux disease with esophagitis: Secondary | ICD-10-CM | POA: Diagnosis not present

## 2018-02-11 DIAGNOSIS — K259 Gastric ulcer, unspecified as acute or chronic, without hemorrhage or perforation: Secondary | ICD-10-CM | POA: Diagnosis not present

## 2018-02-26 ENCOUNTER — Ambulatory Visit
Admission: RE | Admit: 2018-02-26 | Discharge: 2018-02-26 | Disposition: A | Discharge status: Home / Self Care | Payer: Medicare Other | Source: Ambulatory Visit | Attending: Family Medicine | Admitting: Family Medicine

## 2018-02-26 DIAGNOSIS — Z1231 Encounter for screening mammogram for malignant neoplasm of breast: Secondary | ICD-10-CM | POA: Diagnosis not present

## 2018-03-13 ENCOUNTER — Ambulatory Visit (INDEPENDENT_AMBULATORY_CARE_PROVIDER_SITE_OTHER): Payer: Medicare Other

## 2018-03-13 VITALS — BP 138/62 | HR 95 | Temp 98.0°F | Resp 12 | Ht 65.0 in | Wt 137.0 lb

## 2018-03-13 DIAGNOSIS — Z0000 Encounter for general adult medical examination without abnormal findings: Secondary | ICD-10-CM | POA: Diagnosis not present

## 2018-03-13 NOTE — Patient Instructions (Signed)
Angelica Walker , Thank you for taking time to come for your Medicare Wellness Visit. I appreciate your ongoing commitment to your health goals. Please review the following plan we discussed and let me know if I can assist you in the future.   Screening recommendations/referrals: Colorectal Screening: Completed colonoscopy 12/07/17. Repeat every 10 years. Mammogram: Completed 02/26/18. Repeat every year. Bone Density: Completed 02/22/17. Osteoporotic screenings no longer required.  Vision and Dental Exams: Recommended annual ophthalmology exams for early detection of glaucoma and other disorders of the eye Recommended annual dental exams for proper oral hygiene  Diabetic Exams: Recommended annual diabetic eye exams for early detection of retinopathy Recommended annual diabetic foot exams for early detection of peripheral neuropathy.  Diabetic Eye Exam: Please call your physician to schedule this appointment Diabetic Foot Exam: Will be completed with Dr. Vicente Masson on 03/18/18  Vaccinations: Influenza vaccine: Declined Pneumococcal vaccine: Completed series Tdap vaccine: Up to date Shingles vaccine: Please call your insurance company to determine your out of pocket expense for the Shingrix vaccine. You may also receive this vaccine at your local pharmacy or Health Dept.   Advanced directives: Advance directive discussed with you today. I have provided a copy for you to complete at home and have notarized. Once this is complete please bring a copy in to our office so we can scan it into your chart.  Conditions/risks identified: Recommend to drink at least 6-8 8oz glasses of water per day.  Next appointment: Please schedule your Annual Wellness Visit with your Nurse Health Advisor in one year.  Preventive Care 30 Years and Older, Female Preventive care refers to lifestyle choices and visits with your health care provider that can promote health and wellness. What does preventive care include?  A  yearly physical exam. This is also called an annual well check.  Dental exams once or twice a year.  Routine eye exams. Ask your health care provider how often you should have your eyes checked.  Personal lifestyle choices, including:  Daily care of your teeth and gums.  Regular physical activity.  Eating a healthy diet.  Avoiding tobacco and drug use.  Limiting alcohol use.  Practicing safe sex.  Taking low-dose aspirin every day.  Taking vitamin and mineral supplements as recommended by your health care provider. What happens during an annual well check? The services and screenings done by your health care provider during your annual well check will depend on your age, overall health, lifestyle risk factors, and family history of disease. Counseling  Your health care provider may ask you questions about your:  Alcohol use.  Tobacco use.  Drug use.  Emotional well-being.  Home and relationship well-being.  Sexual activity.  Eating habits.  History of falls.  Memory and ability to understand (cognition).  Work and work Statistician.  Reproductive health. Screening  You may have the following tests or measurements:  Height, weight, and BMI.  Blood pressure.  Lipid and cholesterol levels. These may be checked every 5 years, or more frequently if you are over 93 years old.  Skin check.  Lung cancer screening. You may have this screening every year starting at age 57 if you have a 30-pack-year history of smoking and currently smoke or have quit within the past 15 years.  Fecal occult blood test (FOBT) of the stool. You may have this test every year starting at age 26.  Flexible sigmoidoscopy or colonoscopy. You may have a sigmoidoscopy every 5 years or a colonoscopy every 10 years  starting at age 80.  Hepatitis C blood test.  Hepatitis B blood test.  Sexually transmitted disease (STD) testing.  Diabetes screening. This is done by checking your blood  sugar (glucose) after you have not eaten for a while (fasting). You may have this done every 1-3 years.  Bone density scan. This is done to screen for osteoporosis. You may have this done starting at age 50.  Mammogram. This may be done every 1-2 years. Talk to your health care provider about how often you should have regular mammograms. Talk with your health care provider about your test results, treatment options, and if necessary, the need for more tests. Vaccines  Your health care provider may recommend certain vaccines, such as:  Influenza vaccine. This is recommended every year.  Tetanus, diphtheria, and acellular pertussis (Tdap, Td) vaccine. You may need a Td booster every 10 years.  Zoster vaccine. You may need this after age 26.  Pneumococcal 13-valent conjugate (PCV13) vaccine. One dose is recommended after age 66.  Pneumococcal polysaccharide (PPSV23) vaccine. One dose is recommended after age 76. Talk to your health care provider about which screenings and vaccines you need and how often you need them. This information is not intended to replace advice given to you by your health care provider. Make sure you discuss any questions you have with your health care provider. Document Released: 12/31/2015 Document Revised: 08/23/2016 Document Reviewed: 10/05/2015 Elsevier Interactive Patient Education  2017 [place] Prevention in the Home Falls can cause injuries. They can happen to people of all ages. There are many things you can do to make your home safe and to help prevent falls. What can I do on the outside of my home?  Regularly fix the edges of walkways and driveways and fix any cracks.  Remove anything that might make you trip as you walk through a door, such as a raised step or threshold.  Trim any bushes or trees on the path to your home.  Use bright outdoor lighting.  Clear any walking paths of anything that might make someone trip, such as rocks or  tools.  Regularly check to see if handrails are loose or broken. Make sure that both sides of any steps have handrails.  Any raised decks and porches should have guardrails on the edges.  Have any leaves, snow, or ice cleared regularly.  Use sand or salt on walking paths during winter.  Clean up any spills in your garage right away. This includes oil or grease spills. What can I do in the bathroom?  Use night lights.  Install grab bars by the toilet and in the tub and shower. Do not use towel bars as grab bars.  Use non-skid mats or decals in the tub or shower.  If you need to sit down in the shower, use a plastic, non-slip stool.  Keep the floor dry. Clean up any water that spills on the floor as soon as it happens.  Remove soap buildup in the tub or shower regularly.  Attach bath mats securely with double-sided non-slip rug tape.  Do not have throw rugs and other things on the floor that can make you trip. What can I do in the bedroom?  Use night lights.  Make sure that you have a light by your bed that is easy to reach.  Do not use any sheets or blankets that are too big for your bed. They should not hang down onto the floor.  Have a  firm chair that has side arms. You can use this for support while you get dressed.  Do not have throw rugs and other things on the floor that can make you trip. What can I do in the kitchen?  Clean up any spills right away.  Avoid walking on wet floors.  Keep items that you use a lot in easy-to-reach places.  If you need to reach something above you, use a strong step stool that has a grab bar.  Keep electrical cords out of the way.  Do not use floor polish or wax that makes floors slippery. If you must use wax, use non-skid floor wax.  Do not have throw rugs and other things on the floor that can make you trip. What can I do with my stairs?  Do not leave any items on the stairs.  Make sure that there are handrails on both  sides of the stairs and use them. Fix handrails that are broken or loose. Make sure that handrails are as long as the stairways.  Check any carpeting to make sure that it is firmly attached to the stairs. Fix any carpet that is loose or worn.  Avoid having throw rugs at the top or bottom of the stairs. If you do have throw rugs, attach them to the floor with carpet tape.  Make sure that you have a light switch at the top of the stairs and the bottom of the stairs. If you do not have them, ask someone to add them for you. What else can I do to help prevent falls?  Wear shoes that:  Do not have high heels.  Have rubber bottoms.  Are comfortable and fit you well.  Are closed at the toe. Do not wear sandals.  If you use a stepladder:  Make sure that it is fully opened. Do not climb a closed stepladder.  Make sure that both sides of the stepladder are locked into place.  Ask someone to hold it for you, if possible.  Clearly mark and make sure that you can see:  Any grab bars or handrails.  First and last steps.  Where the edge of each step is.  Use tools that help you move around (mobility aids) if they are needed. These include:  Canes.  Walkers.  Scooters.  Crutches.  Turn on the lights when you go into a dark area. Replace any light bulbs as soon as they burn out.  Set up your furniture so you have a clear path. Avoid moving your furniture around.  If any of your floors are uneven, fix them.  If there are any pets around you, be aware of where they are.  Review your medicines with your doctor. Some medicines can make you feel dizzy. This can increase your chance of falling. Ask your doctor what other things that you can do to help prevent falls. This information is not intended to replace advice given to you by your health care provider. Make sure you discuss any questions you have with your health care provider. Document Released: 09/30/2009 Document Revised:  05/11/2016 Document Reviewed: 01/08/2015 Elsevier Interactive Patient Education  2017 [place] with Quitting Smoking Quitting smoking is a physical and mental challenge. You will face cravings, withdrawal symptoms, and temptation. Before quitting, work with your health care provider to make a plan that can help you cope. Preparation can help you quit and keep you from giving in. How can I cope with cravings? Cravings  usually last for 5-10 minutes. If you get through it, the craving will pass. Consider taking the following actions to help you cope with cravings:  Keep your mouth busy: ? Chew sugar-free gum. ? Suck on hard candies or a straw. ? Brush your teeth.  Keep your hands and body busy: ? Immediately change to a different activity when you feel a craving. ? Squeeze or play with a ball. ? Do an activity or a hobby, like making bead jewelry, practicing needlepoint, or working with wood. ? Mix up your normal routine. ? Take a short exercise break. Go for a quick walk or run up and down stairs. ? Spend time in public places where smoking is not allowed.  Focus on doing something kind or helpful for someone else.  Call a friend or family member to talk during a craving.  Join a support group.  Call a quit line, such as 1-800-QUIT-NOW.  Talk with your health care provider about medicines that might help you cope with cravings and make quitting easier for you.  How can I deal with withdrawal symptoms? Your body may experience negative effects as it tries to get used to not having nicotine in the system. These effects are called withdrawal symptoms. They may include:  Feeling hungrier than normal.  Trouble concentrating.  Irritability.  Trouble sleeping.  Feeling depressed.  Restlessness and agitation.  Craving a cigarette.  To manage withdrawal symptoms:  Avoid places, people, and activities that trigger your cravings.  Remember why you want to  quit.  Get plenty of sleep.  Avoid coffee and other caffeinated drinks. These may worsen some of your symptoms.  How can I handle social situations? Social situations can be difficult when you are quitting smoking, especially in the first few weeks. To manage this, you can:  Avoid parties, bars, and other social situations where people might be smoking.  Avoid alcohol.  Leave right away if you have the urge to smoke.  Explain to your family and friends that you are quitting smoking. Ask for understanding and support.  Plan activities with friends or family where smoking is not an option.  What are some ways I can cope with stress? Wanting to smoke may cause stress, and stress can make you want to smoke. Find ways to manage your stress. Relaxation techniques can help. For example:  Breathe slowly and deeply, in through your nose and out through your mouth.  Listen to soothing, relaxing music.  Talk with a family member or friend about your stress.  Light a candle.  Soak in a bath or take a shower.  Think about a peaceful place.  What are some ways I can prevent weight gain? Be aware that many people gain weight after they quit smoking. However, not everyone does. To keep from gaining weight, have a plan in place before you quit and stick to the plan after you quit. Your plan should include:  Having healthy snacks. When you have a craving, it may help to: ? Eat plain popcorn, crunchy carrots, celery, or other cut vegetables. ? Chew sugar-free gum.  Changing how you eat: ? Eat small portion sizes at meals. ? Eat 4-6 small meals throughout the day instead of 1-2 large meals a day. ? Be mindful when you eat. Do not watch television or do other things that might distract you as you eat.  Exercising regularly: ? Make time to exercise each day. If you do not have time for a long workout,  do short bouts of exercise for 5-10 minutes several times a day. ? Do some form of  strengthening exercise, like weight lifting, and some form of aerobic exercise, like running or swimming.  Drinking plenty of water or other low-calorie or no-calorie drinks. Drink 6-8 glasses of water daily, or as much as instructed by your health care provider.  Summary  Quitting smoking is a physical and mental challenge. You will face cravings, withdrawal symptoms, and temptation to smoke again. Preparation can help you as you go through these challenges.  You can cope with cravings by keeping your mouth busy (such as by chewing gum), keeping your body and hands busy, and making calls to family, friends, or a helpline for people who want to quit smoking.  You can cope with withdrawal symptoms by avoiding places where people smoke, avoiding drinks with caffeine, and getting plenty of rest.  Ask your health care provider about the different ways to prevent weight gain, avoid stress, and handle social situations. This information is not intended to replace advice given to you by your health care provider. Make sure you discuss any questions you have with your health care provider. Document Released: 12/01/2016 Document Revised: 12/01/2016 Document Reviewed: 12/01/2016 Elsevier Interactive Patient Education  2018 [employer].   Smoking Tobacco Information Smoking tobacco will very likely harm your health. Tobacco contains a poisonous (toxic), colorless chemical called nicotine. Nicotine affects the brain and makes tobacco addictive. This change in your brain can make it hard to stop smoking. Tobacco also has other toxic chemicals that can hurt your body and raise your risk of many cancers. How can smoking tobacco affect me? Smoking tobacco can increase your chances of having serious health conditions, such as:  Cancer. Smoking is most commonly associated with lung cancer, but can lead to cancer in other parts of the body.  Chronic obstructive pulmonary disease (COPD). This is a long-term  lung condition that makes it hard to breathe. It also gets worse over time.  High blood pressure (hypertension), heart disease, stroke, or heart attack.  Lung infections, such as pneumonia.  Cataracts. This is when the lenses in the eyes become clouded.  Digestive problems. This may include peptic ulcers, heartburn, and gastroesophageal reflux disease (GERD).  Oral health problems, such as gum disease and tooth loss.  Loss of taste and smell.  Smoking can affect your appearance by causing:  Wrinkles.  Yellow or stained teeth, fingers, and fingernails.  Smoking tobacco can also affect your social life.  Many workplaces, [employer], hotels, and public places are tobacco-free. This means that you may experience challenges in finding places to smoke when away from home.  The cost of a smoking habit can be expensive. Expenses for someone who smokes come in two ways: ? You spend money on a regular basis to buy tobacco. ? Your health care costs in the long-term are higher if you smoke.  Tobacco smoke can also affect the health of those around you. Children of smokers have greater chances of: ? Sudden infant death syndrome (SIDS). ? Ear infections. ? Lung infections.  What lifestyle changes can be made?  Do not start smoking. Quit if you already do.  To quit smoking: ? Make a plan to quit smoking and commit yourself to it. Look for programs to help you and ask your health care provider for recommendations and ideas. ? Talk with your health care provider about using nicotine replacement medicines to help you quit. Medicine replacement medicines include gum,  lozenges, patches, sprays, or pills. ? Do not replace cigarette smoking with electronic cigarettes, which are commonly called e-cigarettes. The safety of e-cigarettes is not known, and some may contain harmful chemicals. ? Avoid places, people, or situations that tempt you to smoke. ? If you try to quit but return to smoking,  don't give up hope. It is very common for people to try a number of times before they fully succeed. When you feel ready again, give it another try.  Quitting smoking might affect the way you eat as well as your weight. Be prepared to monitor your eating habits. Get support in planning and following a healthy diet.  Ask your health care provider about having regular tests (screenings) to check for cancer. This may include blood tests, imaging tests, and other tests.  Exercise regularly. Consider taking walks, joining a gym, or doing yoga or exercise classes.  Develop skills to manage your stress. These skills include meditation. What are the benefits of quitting smoking? By quitting smoking, you may:  Lower your risk of getting cancer and other diseases caused by smoking.  Live longer.  Breathe better.  Lower your blood pressure and heart rate.  Stop your addiction to tobacco.  Stop creating secondhand smoke that hurts other people.  Improve your sense of taste and smell.  Look better over time, due to having fewer wrinkles and less staining.  What can happen if changes are not made? If you do not stop smoking, you may:  Get cancer and other diseases.  Develop COPD or other long-term (chronic) lung conditions.  Develop serious problems with your heart and blood vessels (cardiovascular system).  Need more tests to screen for problems caused by smoking.  Have higher, long-term healthcare costs from medicines or treatments related to smoking.  Continue to have worsening changes in your lungs, mouth, and nose.  Where to find support: To get support to quit smoking, consider:  Asking your health care provider for more information and resources.  Taking classes to learn more about quitting smoking.  Looking for local organizations that offer resources about quitting smoking.  Joining a support group for people who want to quit smoking in your local community.  Where to  find more information: You may find more information about quitting smoking from:  HelpGuide.org: www.helpguide.org/articles/addictions/how-to-quit-smoking.htm  https://hall.com/: smokefree.gov  American Lung Association: www.lung.org  Contact a health care provider if:  You have problems breathing.  Your lips, nose, or fingers turn blue.  You have chest pain.  You are coughing up blood.  You feel faint or you pass out.  You have other noticeable changes that cause you to worry. Summary  Smoking tobacco can negatively affect your health, the health of those around you, your finances, and your social life.  Do not start smoking. Quit if you already do. If you need help quitting, ask your health care provider.  Think about joining a support group for people who want to quit smoking in your local community. There are many effective programs that will help you to quit this behavior. This information is not intended to replace advice given to you by your health care provider. Make sure you discuss any questions you have with your health care provider. Document Released: 12/19/2016 Document Revised: 12/19/2016 Document Reviewed: 12/19/2016 Elsevier Interactive Patient Education  [employer].

## 2018-03-13 NOTE — Progress Notes (Signed)
Subjective:   Angelica Walker is a 77 y.o. female who presents for an Initial Medicare Annual Wellness Visit.  Review of Systems    N/A  Cardiac Risk Factors include: advanced age (>43men, >57 women);diabetes mellitus;dyslipidemia;hypertension;sedentary lifestyle;smoking/ tobacco exposure     Objective:    Today's Vitals   03/13/18 1135  Weight: 137 lb (62.1 kg)  Height: 5\' 5"  (1.651 m)   Body mass index is 22.8 kg/m.  Advanced Directives 03/13/2018 12/07/2017  Does Patient Have a Medical Advance Directive? No No  Would patient like information on creating a medical advance directive? Yes (MAU/Ambulatory/Procedural Areas - Information given) -    Current Medications (verified) Outpatient Encounter Medications as of 03/13/2018  Medication Sig  . acetaminophen (TYLENOL) 500 MG tablet Take 1,000 mg by mouth 2 (two) times daily.  . ferrous sulfate 325 (65 FE) MG tablet Take 1 tablet (325 mg total) by mouth daily.  Marland Kitchen gabapentin (NEURONTIN) 100 MG capsule Take 1 capsule (100 mg total) by mouth at bedtime.  Marland Kitchen lisinopril-hydrochlorothiazide (PRINZIDE,ZESTORETIC) 20-12.5 MG tablet Take 1 tablet by mouth daily.  . pantoprazole (PROTONIX) 40 MG tablet Take 1 tablet by mouth daily.  . [DISCONTINUED] Cyanocobalamin (VITAMIN B 12 PO) Take 1,000 mcg by mouth daily.  . [DISCONTINUED] Glucosamine-Chondroitin (GLUCOSAMINE CHONDR COMPLEX PO) Take by mouth.   No facility-administered encounter medications on file as of 03/13/2018.     Allergies (verified) Simvastatin   History: Past Medical History:  Diagnosis Date  . COPD (chronic obstructive pulmonary disease) ([place])   . Hyperlipidemia   . Hypertension    Past Surgical History:  Procedure Laterality Date  . COLONOSCOPY WITH PROPOFOL N/A 12/07/2017   Procedure: COLONOSCOPY WITH PROPOFOL;  Surgeon: Lollie Sails, MD;  Location: [facility] ENDOSCOPY;  Service: Endoscopy;  Laterality: N/A;  . ESOPHAGOGASTRODUODENOSCOPY (EGD) WITH PROPOFOL  N/A 12/07/2017   Procedure: ESOPHAGOGASTRODUODENOSCOPY (EGD) WITH PROPOFOL;  Surgeon: Lollie Sails, MD;  Location: [facility] ENDOSCOPY;  Service: Endoscopy;  Laterality: N/A;   Family History  Problem Relation Age of Onset  . Breast cancer Maternal Aunt    Social History   Socioeconomic History  . Marital status: Divorced    Spouse name: Not on file  . Number of children: 1  . Years of education: some college  . Highest education level: 12th grade  Occupational History  . Occupation: Retired  Scientific laboratory technician  . Financial resource strain: Not hard at all  . Food insecurity:    Worry: Never true    Inability: Never true  . Transportation needs:    Medical: No    Non-medical: No  Tobacco Use  . Smoking status: Current Every Day Smoker    Packs/day: 1.00    Years: 54.00    Pack years: 54.00    Types: Cigarettes  . Smokeless tobacco: Never Used  Substance and Sexual Activity  . Alcohol use: No    Frequency: Never  . Drug use: No  . Sexual activity: Not Currently  Lifestyle  . Physical activity:    Days per week: 0 days    Minutes per session: 0 min  . Stress: Not at all  Relationships  . Social connections:    Talks on phone: Patient refused    Gets together: Patient refused    Attends religious service: Patient refused    Active member of club or organization: Patient refused    Attends meetings of clubs or organizations: Patient refused    Relationship status: Patient refused  Other  Topics Concern  . Not on file  Social History Narrative  . Not on file    Tobacco Counseling Ready to quit: Yes Counseling given: Yes   Clinical Intake:  Pre-visit preparation completed: Yes  Pain : No/denies pain   BMI - recorded: 22.8 Nutritional Status: BMI of 19-24  Normal  Nutrition Risk Assessment: Has the patient had any N/V/D within the last 2 months?  No Does the patient have any non-healing wounds?  No Has the patient had any unintentional weight loss or weight  gain?  No  Is the patient diabetic?  Prediabetic If diabetic, was a CBG obtained today?  No Did the patient bring in their glucometer from home?  N/A Comments: N/A  Diabetic Exams: Diabetic Eye Exam: Overdue for diabetic eye exam. Last completed 12/19/16. Pt has been advised to call her ophthalmologist for completion. Diabetic Foot Exam: Overdue for diabetic foot exam. Pt has been advised about the importance in completing this exam. Advised to keep her appt with Dr. Vicente Masson as scheduled for completion. Verbalized acceptance and understanding.  How often do you need to have someone help you when you read instructions, pamphlets, or other written materials from your doctor or pharmacy?: 1 - Never  Interpreter Needed?: No  Information entered by :: AEversole, LPN   Activities of Daily Living In your present state of health, do you have any difficulty performing the following activities: 03/13/2018 12/19/2017  Hearing? N N  Comment denies hearing aids -  Vision? Y N  Comment wears eyeglasses; cataracts -  Difficulty concentrating or making decisions? Y N  Comment short term memory loss -  Walking or climbing stairs? Y N  Comment dyspnea, joint and back pain -  Dressing or bathing? N N  Doing errands, shopping? N N  Preparing Food and eating ? N -  Comment denies dentures -  Using the Toilet? N -  In the past six months, have you accidently leaked urine? N -  Do you have problems with loss of bowel control? N -  Managing your Medications? N -  Managing your Finances? N -  Housekeeping or managing your Housekeeping? N -  Some recent data might be hidden     Immunizations and Health Maintenance Immunization History  Administered Date(s) Administered  . Pneumococcal Conjugate-13 02/07/2017  . Pneumococcal Polysaccharide-23 12/21/2014  . Tdap 12/21/2014   Health Maintenance Due  Topic Date Due  . FOOT EXAM  04/19/1951  . OPHTHALMOLOGY EXAM  12/19/2017    Patient Care  Team: Adline Potter, MD as PCP - General (Family Medicine) Ok Edwards, NP as Nurse Practitioner (Gastroenterology)  Indicate any recent Medical Services you may have received from other than [facility] providers in the past year (date may be approximate).     Assessment:   This is a routine wellness examination for Torey.  Hearing/Vision screen Vision Screening Comments: Sees Dr. Wyatt Portela for annual eye exams  Dietary issues and exercise activities discussed: Current Exercise Habits: The patient does not participate in regular exercise at present, Exercise limited by: None identified  Goals    . DIET - INCREASE WATER INTAKE     Recommend to drink at least 6-8 8oz glasses of water per day.      Depression Screen PHQ 2/9 Scores 03/13/2018 12/19/2017  PHQ - 2 Score 0 0  PHQ- 9 Score 0 -    Fall Risk Fall Risk  03/13/2018 12/19/2017  Falls in the past year? No No  Risk for  fall due to : History of fall(s) -  Risk for fall due to: Comment fell in parking lot years ago -    Is the home free of loose throw rugs in walkways, pet beds, electrical cords, etc? Yes Adequate lighting to reduce risk of falls?  Yes In addition, does the patient have any of the following: Stairs in or around the home WITH handrails? Yes Grab bars in the bathroom? No  Shower chair or a place to sit while bathing? Yes Use of a cane, walker or w/c? No Use of an elevated toilet seat or a handicapped toilet? No  Timed Get Up and Go Performed: Yes. Pt ambulated 10 feet within 8 sec. Gait stead-fast and without the use of an assistive device. No intervention required at this time. Fall risk prevention has been discussed.  Pt declined my offer to send Community Resource Referral to Care Guide for an elevated toilet seat.  Cognitive Function:     6CIT Screen 03/13/2018  What Year? 0 points  What month? 0 points  What time? 3 points  Count back from 20 0 points  Months in reverse 0 points  Repeat  phrase 4 points  Total Score 7    Screening Tests Health Maintenance  Topic Date Due  . FOOT EXAM  04/19/1951  . OPHTHALMOLOGY EXAM  12/19/2017  . INFLUENZA VACCINE  09/17/2018 (Originally 07/18/2017)  . HEMOGLOBIN A1C  06/18/2018  . MAMMOGRAM  02/27/2019  . TETANUS/TDAP  12/21/2024  . COLONOSCOPY  12/08/2027  . DEXA SCAN  Completed  . PNA vac Low Risk Adult  Completed    Qualifies for Shingles Vaccine? Yes. Due for Zostavax or Shingrix vaccine. Education has been provided regarding the importance of this vaccine. Pt has been advised to call her insurance company to determine her out of pocket expense. Advised she may also receive this vaccine at her local pharmacy or Health Dept. Verbalized acceptance and understanding.  Due for Flu vaccine. Declined my offer to administer today. Education has been provided regarding the importance of this vaccine but still declined. Pt has been advised to call our office is she should change her mind and wish to receive this vaccine. Also advised she may receive this vaccine at her local pharmacy or Health Dept. Pt is aware to provide a copy of her vaccination record if she chooses to receive this vaccine at her local pharmacy. Verbalized acceptance and understanding.  Cancer Screenings: Lung: Low Dose CT Chest recommended if Age 11-80 years, 30 pack-year currently smoking OR have quit w/in 15years. Patient does qualify. An Epic message has been sent to Burgess Estelle, RN (Oncology Nurse Navigator) regarding the possible need for this exam. Raquel Sarna will review the patient's chart to determine if the patient truly qualifies for the exam. If the patient qualifies, Raquel Sarna will order the Low Dose CT of the chest to facilitate the scheduling of this exam. Breast: Up to date on Mammogram? Yes. Completed 02/26/18. Repeat every year.   Up to date of Bone Density/Dexa? Yes. Completed 02/22/17. Osteoporotic screenings no longer required. Colorectal: Completed colonoscopy  12/07/17. Repeat every 10 years.  Additional Screenings: Hepatitis B/HIV/Syphillis: Does not qualify Hepatitis C Screening: Does not qualify    Plan:  I have personally reviewed and addressed the Medicare Annual Wellness questionnaire and have noted the following in the patient's chart:  A. Medical and social history B. Use of alcohol, tobacco or illicit drugs  C. Current medications and supplements D. Functional ability and status  E.  Nutritional status F.  Physical activity G. Advance directives H. List of other physicians I.  Hospitalizations, surgeries, and ER visits in previous 12 months J.  [place] such as hearing and vision if needed, cognitive and depression L. Referrals and appointments  In addition, I have reviewed and discussed with patient certain preventive protocols, quality metrics, and best practice recommendations. A written personalized care plan for preventive services as well as general preventive health recommendations were provided to patient.  Signed,  Aleatha Borer, LPN Nurse Health Advisor  MD Recommendations: Due for Zostavax or Shingrix vaccine. Education has been provided regarding the importance of this vaccine. Pt has been advised to call her insurance company to determine her out of pocket expense. Advised she may also receive this vaccine at her local pharmacy or Health Dept. Verbalized acceptance and understanding.  Due for Flu vaccine. Declined my offer to administer today. Education has been provided regarding the importance of this vaccine but still declined. Pt has been advised to call our office is she should change her mind and wish to receive this vaccine. Also advised she may receive this vaccine at her local pharmacy or Health Dept. Pt is aware to provide a copy of her vaccination record if she chooses to receive this vaccine at her local pharmacy. Verbalized acceptance and understanding.  Lung Cancer Screening: Patient does  qualify. An Epic message has been sent to Burgess Estelle, RN (Oncology Nurse Navigator) regarding the possible need for this exam. Raquel Sarna will review the patient's chart to determine if the patient truly qualifies for the exam. If the patient qualifies, Raquel Sarna will order the Low Dose CT of the chest to facilitate the scheduling of this exam.  Diabetic Eye Exam: Overdue for diabetic eye exam. Last completed 12/19/16. Pt has been advised to call her ophthalmologist for completion.  Diabetic Foot Exam: Overdue for diabetic foot exam. Pt has been advised about the importance in completing this exam. Advised to keep her appt with Dr. Vicente Masson as scheduled for completion. Verbalized acceptance and understanding.

## 2018-03-14 ENCOUNTER — Telehealth: Payer: Self-pay | Admitting: *Deleted

## 2018-03-14 DIAGNOSIS — Z87891 Personal history of nicotine dependence: Secondary | ICD-10-CM

## 2018-03-14 DIAGNOSIS — Z122 Encounter for screening for malignant neoplasm of respiratory organs: Secondary | ICD-10-CM

## 2018-03-14 NOTE — Telephone Encounter (Signed)
Received referral for initial lung cancer screening scan. Contacted patient and obtained smoking history,(current, 54 pack year) as well as answering questions related to screening process. Patient denies signs of lung cancer such as weight loss or hemoptysis. Patient denies comorbidity that would prevent curative treatment if lung cancer were found. Patient is scheduled for shared decision making visit and CT scan on 03/28/18.

## 2018-03-18 ENCOUNTER — Encounter: Payer: Self-pay | Admitting: Family Medicine

## 2018-03-18 ENCOUNTER — Ambulatory Visit (INDEPENDENT_AMBULATORY_CARE_PROVIDER_SITE_OTHER): Payer: Medicare Other | Admitting: Family Medicine

## 2018-03-18 VITALS — BP 131/80 | HR 78 | Resp 16 | Ht 65.0 in | Wt 136.7 lb

## 2018-03-18 DIAGNOSIS — M5136 Other intervertebral disc degeneration, lumbar region: Secondary | ICD-10-CM

## 2018-03-18 DIAGNOSIS — E538 Deficiency of other specified B group vitamins: Secondary | ICD-10-CM | POA: Diagnosis not present

## 2018-03-18 DIAGNOSIS — J449 Chronic obstructive pulmonary disease, unspecified: Secondary | ICD-10-CM

## 2018-03-18 DIAGNOSIS — D500 Iron deficiency anemia secondary to blood loss (chronic): Secondary | ICD-10-CM | POA: Diagnosis not present

## 2018-03-18 DIAGNOSIS — R7303 Prediabetes: Secondary | ICD-10-CM | POA: Diagnosis not present

## 2018-03-18 DIAGNOSIS — G2581 Restless legs syndrome: Secondary | ICD-10-CM | POA: Diagnosis not present

## 2018-03-18 DIAGNOSIS — I10 Essential (primary) hypertension: Secondary | ICD-10-CM

## 2018-03-18 DIAGNOSIS — K259 Gastric ulcer, unspecified as acute or chronic, without hemorrhage or perforation: Secondary | ICD-10-CM

## 2018-03-18 DIAGNOSIS — F17200 Nicotine dependence, unspecified, uncomplicated: Secondary | ICD-10-CM | POA: Diagnosis not present

## 2018-03-18 NOTE — Progress Notes (Signed)
Date:  03/18/2018   Name:  Angelica Walker   DOB:  Jun 08, 1941   MRN:  [id number]  PCP:  Adline Potter, MD    Chief Complaint: COPD   History of Present Illness:  This is a 77 y.o. female seen for two month f/u. For repeat UGI this week, GI hopes to decrease Protonix to daily. Still smoking but has cut back, has appt for CT for lung cancer screening but plans to cancel. Occ lightheadedness on standing, otherwise feels well, thinks gabapentin helping RLS/sleep. DDD ok on prn Tylenol, avoiding NSAIDS. Remains on Fe supp qod for anemia. No longer taking B12 supp. Declines Shingrix.  Review of Systems:  Review of Systems  Constitutional: Negative for chills and fever.  Respiratory: Negative for shortness of breath.   Cardiovascular: Negative for chest pain and leg swelling.  Genitourinary: Negative for difficulty urinating.  Neurological: Negative for syncope.    Patient Active Problem List   Diagnosis Date Noted  . Smoker 03/18/2018  . RLS (restless legs syndrome) 01/18/2018  . Anemia due to blood loss 12/20/2017  . Prediabetes 12/19/2017  . Hyperlipidemia, unspecified 12/19/2017  . Hypertension, essential, benign 12/19/2017  . B12 deficiency 12/19/2017  . DDD (degenerative disc disease), lumbar 12/19/2017  . Multiple gastric ulcers 12/19/2017  . COPD (chronic obstructive pulmonary disease) ([place]) 12/21/2014    Prior to Admission medications   Medication Sig Start Date End Date Taking? Authorizing Provider  acetaminophen (TYLENOL) 500 MG tablet Take 1,000 mg by mouth 2 (two) times daily as needed.   Yes [provider]  ferrous sulfate 325 (65 FE) MG tablet Take 1 tablet (325 mg total) by mouth daily. 12/20/17  Yes Jestine Bicknell, Gwyndolyn Saxon, MD  gabapentin (NEURONTIN) 100 MG capsule Take 1 capsule (100 mg total) by mouth at bedtime. 01/18/18  Yes Erlene Devita, Gwyndolyn Saxon, MD  lisinopril-hydrochlorothiazide (PRINZIDE,ZESTORETIC) 20-12.5 MG tablet Take 1 tablet by mouth daily. 01/18/18  Yes Elzada Pytel,  Gwyndolyn Saxon, MD  pantoprazole (PROTONIX) 40 MG tablet Take 1 tablet by mouth daily. 12/07/17  Yes [provider]    Allergies  Allergen Reactions  . Simvastatin Other (See Comments)    Past Surgical History:  Procedure Laterality Date  . COLONOSCOPY WITH PROPOFOL N/A 12/07/2017   Procedure: COLONOSCOPY WITH PROPOFOL;  Surgeon: Lollie Sails, MD;  Location: [facility] ENDOSCOPY;  Service: Endoscopy;  Laterality: N/A;  . ESOPHAGOGASTRODUODENOSCOPY (EGD) WITH PROPOFOL N/A 12/07/2017   Procedure: ESOPHAGOGASTRODUODENOSCOPY (EGD) WITH PROPOFOL;  Surgeon: Lollie Sails, MD;  Location: [facility] ENDOSCOPY;  Service: Endoscopy;  Laterality: N/A;    Social History   Tobacco Use  . Smoking status: Current Every Day Smoker    Packs/day: 1.00    Years: 54.00    Pack years: 54.00    Types: Cigarettes  . Smokeless tobacco: Never Used  Substance Use Topics  . Alcohol use: No    Frequency: Never  . Drug use: No    Family History  Problem Relation Age of Onset  . Breast cancer Maternal Aunt     Medication list has been reviewed and updated.  Physical Examination: BP 131/80   Pulse 78   Resp 16   Ht 5\' 5"  (1.651 m)   Wt 136 lb 11.2 oz (62 kg)   SpO2 98%   BMI 22.75 kg/m   Physical Exam  Constitutional: She appears well-developed and well-nourished.  Cardiovascular: Normal rate, regular rhythm and normal heart sounds.  Pulmonary/Chest: Effort normal and breath sounds normal.  Abdominal: Soft.  Musculoskeletal: She exhibits  no edema.  Neurological: She is alert.  Skin: Skin is warm and dry.  Psychiatric: She has a normal mood and affect. Her behavior is normal.  Nursing note and vitals reviewed.   Assessment and Plan:  1. Chronic obstructive pulmonary disease, unspecified COPD type ([place]) Stable off meds, ok not to proceed with CT lung screening  2. Smoker Encouraged cutting back, advised cessation  3. Multiple gastric ulcers For repeat UGI this week, plans to  reduce Protonix to daily per GI  4. Anemia due to blood loss On Fe supplement qod - CBC - Ferritin  5. Hypertension, essential, benign Well controlled, orthostatics negative, cont Prinzide  6. RLS (restless legs syndrome) Improved on gabapentin qhs  7. DDD (degenerative disc disease), lumbar OK on Tylenol bid prn, avoiding NSAIDS  8. Prediabetes - HgB A1c  9. B12 deficiency Well controlled on supplement in Jan, consider restart on long-term PPI  Return in about 6 months (around 09/17/2018).  Satira Anis. [facility]  03/18/2018

## 2018-03-19 ENCOUNTER — Other Ambulatory Visit: Payer: Self-pay | Admitting: Family Medicine

## 2018-03-19 LAB — CBC
HEMATOCRIT: 32 % — AB (ref 34.0–46.6)
HEMOGLOBIN: 9.3 g/dL — AB (ref 11.1–15.9)
MCH: 21.8 pg — AB (ref 26.6–33.0)
MCHC: 29.1 g/dL — AB (ref 31.5–35.7)
MCV: 75 fL — ABNORMAL LOW (ref 79–97)
Platelets: 465 10*3/uL — ABNORMAL HIGH (ref 150–379)
RBC: 4.27 x10E6/uL (ref 3.77–5.28)
RDW: 21 % — ABNORMAL HIGH (ref 12.3–15.4)
WBC: 7.5 10*3/uL (ref 3.4–10.8)

## 2018-03-19 LAB — FERRITIN: Ferritin: 11 ng/mL — ABNORMAL LOW (ref 15–150)

## 2018-03-19 LAB — HEMOGLOBIN A1C
Est. average glucose Bld gHb Est-mCnc: 123 mg/dL
HEMOGLOBIN A1C: 5.9 % — AB (ref 4.8–5.6)

## 2018-03-19 MED ORDER — B-12 1000 MCG PO TABS: 1.0000 | ORAL_TABLET | Freq: Every day | ORAL | Status: AC

## 2018-03-20 ENCOUNTER — Encounter: Payer: Self-pay | Admitting: *Deleted

## 2018-03-21 ENCOUNTER — Ambulatory Visit
Admission: RE | Admit: 2018-03-21 | Discharge: 2018-03-21 | Disposition: A | Discharge status: Home / Self Care | Payer: Medicare Other | Source: Ambulatory Visit | Attending: Gastroenterology | Admitting: Gastroenterology

## 2018-03-21 ENCOUNTER — Ambulatory Visit: Payer: Medicare Other | Admitting: Anesthesiology

## 2018-03-21 ENCOUNTER — Encounter
Admission: RE | Disposition: A | Discharge status: Home / Self Care | Payer: Self-pay | Source: Ambulatory Visit | Attending: Gastroenterology

## 2018-03-21 DIAGNOSIS — E785 Hyperlipidemia, unspecified: Secondary | ICD-10-CM | POA: Diagnosis not present

## 2018-03-21 DIAGNOSIS — K2960 Other gastritis without bleeding: Secondary | ICD-10-CM | POA: Diagnosis not present

## 2018-03-21 DIAGNOSIS — Z79899 Other long term (current) drug therapy: Secondary | ICD-10-CM | POA: Diagnosis not present

## 2018-03-21 DIAGNOSIS — K2900 Acute gastritis without bleeding: Secondary | ICD-10-CM | POA: Diagnosis not present

## 2018-03-21 DIAGNOSIS — F17200 Nicotine dependence, unspecified, uncomplicated: Secondary | ICD-10-CM | POA: Insufficient documentation

## 2018-03-21 DIAGNOSIS — J449 Chronic obstructive pulmonary disease, unspecified: Secondary | ICD-10-CM | POA: Diagnosis not present

## 2018-03-21 DIAGNOSIS — E119 Type 2 diabetes mellitus without complications: Secondary | ICD-10-CM | POA: Insufficient documentation

## 2018-03-21 DIAGNOSIS — I10 Essential (primary) hypertension: Secondary | ICD-10-CM | POA: Insufficient documentation

## 2018-03-21 DIAGNOSIS — K219 Gastro-esophageal reflux disease without esophagitis: Secondary | ICD-10-CM | POA: Diagnosis not present

## 2018-03-21 DIAGNOSIS — K259 Gastric ulcer, unspecified as acute or chronic, without hemorrhage or perforation: Secondary | ICD-10-CM | POA: Diagnosis not present

## 2018-03-21 DIAGNOSIS — Q2733 Arteriovenous malformation of digestive system vessel: Secondary | ICD-10-CM | POA: Diagnosis not present

## 2018-03-21 DIAGNOSIS — K31819 Angiodysplasia of stomach and duodenum without bleeding: Secondary | ICD-10-CM | POA: Insufficient documentation

## 2018-03-21 DIAGNOSIS — K210 Gastro-esophageal reflux disease with esophagitis: Secondary | ICD-10-CM | POA: Diagnosis not present

## 2018-03-21 DIAGNOSIS — K3189 Other diseases of stomach and duodenum: Secondary | ICD-10-CM | POA: Diagnosis not present

## 2018-03-21 HISTORY — DX: Hyperlipidemia, unspecified: E78.5

## 2018-03-21 HISTORY — DX: Type 2 diabetes mellitus without complications: E11.9

## 2018-03-21 HISTORY — PX: ESOPHAGOGASTRODUODENOSCOPY (EGD) WITH PROPOFOL: SHX5813

## 2018-03-21 SURGERY — ESOPHAGOGASTRODUODENOSCOPY (EGD) WITH PROPOFOL
Anesthesia: General

## 2018-03-21 MED ORDER — FENTANYL CITRATE (PF) 100 MCG/2ML IJ SOLN
INTRAMUSCULAR | Status: DC | PRN
  Administered 2018-03-21: 50 ug via INTRAVENOUS
  Administered 2018-03-21 (×2): 25 ug via INTRAVENOUS

## 2018-03-21 MED ORDER — LIDOCAINE HCL (CARDIAC) 20 MG/ML IV SOLN
INTRAVENOUS | Status: DC | PRN
  Administered 2018-03-21: 80 mg via INTRAVENOUS

## 2018-03-21 MED ORDER — PHENYLEPHRINE HCL 10 MG/ML IJ SOLN
INTRAMUSCULAR | Status: DC | PRN
  Administered 2018-03-21 (×3): 200 ug via INTRAVENOUS

## 2018-03-21 MED ORDER — PROPOFOL 10 MG/ML IV BOLUS
INTRAVENOUS | Status: DC | PRN
  Administered 2018-03-21: 50 mg via INTRAVENOUS
  Administered 2018-03-21: 80 mg via INTRAVENOUS
  Administered 2018-03-21: 50 mg via INTRAVENOUS
  Administered 2018-03-21: 20 mg via INTRAVENOUS

## 2018-03-21 MED ORDER — SODIUM CHLORIDE 0.9 % IV SOLN: INTRAVENOUS | Status: DC

## 2018-03-21 MED ORDER — SODIUM CHLORIDE 0.9 % IV SOLN
INTRAVENOUS | Status: DC
  Administered 2018-03-21: 1000 mL via INTRAVENOUS

## 2018-03-21 MED ORDER — EPHEDRINE SULFATE 50 MG/ML IJ SOLN
INTRAMUSCULAR | Status: DC | PRN
  Administered 2018-03-21 (×2): 10 mg via INTRAVENOUS

## 2018-03-21 MED ORDER — FENTANYL CITRATE (PF) 100 MCG/2ML IJ SOLN
INTRAMUSCULAR | Status: AC
  Filled 2018-03-21: qty 2

## 2018-03-21 MED ORDER — PROPOFOL 10 MG/ML IV BOLUS
INTRAVENOUS | Status: AC
  Filled 2018-03-21: qty 20

## 2018-03-21 NOTE — Transfer of Care (Signed)
Immediate Anesthesia Transfer of Care Note  Patient: Angelica Walker  Procedure(s) Performed: ESOPHAGOGASTRODUODENOSCOPY (EGD) WITH PROPOFOL (N/A )  Patient Location: PACU  Anesthesia Type:General  Level of Consciousness: awake, alert  and oriented  Airway & Oxygen Therapy: Patient Spontanous Breathing and Patient connected to nasal cannula oxygen  Post-op Assessment: Report given to RN and Post -op Vital signs reviewed and unstable, Anesthesiologist notified  Post vital signs: Reviewed and unstable  Last Vitals:  Vitals Value Taken Time  BP 94/49 03/21/2018 11:52 AM  Temp 36.1 C 03/21/2018 11:48 AM  Pulse 85 03/21/2018 11:53 AM  Resp 17 03/21/2018 11:53 AM  SpO2 99 % 03/21/2018 11:53 AM  Vitals shown include unvalidated device data.  Last Pain:  Vitals:   03/21/18 1148  TempSrc: Tympanic  PainSc: Asleep         Complications: No apparent anesthesia complications

## 2018-03-21 NOTE — Anesthesia Post-op Follow-up Note (Signed)
Anesthesia QCDR form completed.        

## 2018-03-21 NOTE — Anesthesia Preprocedure Evaluation (Signed)
Anesthesia Evaluation  Patient identified by MRN, date of birth, ID band Patient awake    Reviewed: Allergy & Precautions, NPO status , Patient's Chart, lab work & pertinent test results  History of Anesthesia Complications Negative for: history of anesthetic complications  Airway Mallampati: II       Dental   Pulmonary COPD, Current Smoker,           Cardiovascular hypertension, Pt. on medications (-) Past MI and (-) CHF (-) dysrhythmias (-) Valvular Problems/Murmurs     Neuro/Psych neg Seizures    GI/Hepatic Neg liver ROS, PUD, GERD  Medicated and Controlled,  Endo/Other  diabetes (borderline)  Renal/GU negative Renal ROS     Musculoskeletal   Abdominal   Peds  Hematology  (+) anemia ,   Anesthesia Other Findings   Reproductive/Obstetrics                             Anesthesia Physical Anesthesia Plan  ASA: III  Anesthesia Plan: General   Post-op Pain Management:    Induction: Intravenous  PONV Risk Score and Plan:   Airway Management Planned: Nasal Cannula  Additional Equipment:   Intra-op Plan:   Post-operative Plan:   Informed Consent: I have reviewed the patients History and Physical, chart, labs and discussed the procedure including the risks, benefits and alternatives for the proposed anesthesia with the patient or authorized representative who has indicated his/her understanding and acceptance.     Plan Discussed with:   Anesthesia Plan Comments:         Anesthesia Quick Evaluation

## 2018-03-21 NOTE — Op Note (Signed)
[facility] Gastroenterology Patient Name: Angelica Walker Procedure Date: 03/21/2018 11:17 AM MRN: [id number] Account #: [id number] Date of Birth: [birth year] Admit Type: Outpatient Age: 77 Room: [facility] ENDO ROOM 1 Gender: Female Note Status: Finalized Procedure:            Upper GI endoscopy Indications:          Follow-up of gastric ulcer Providers:            Lollie Sails, MD Referring MD:         Juline Patch, MD (Referring MD) Medicines:            Monitored Anesthesia Care Complications:        No immediate complications. Procedure:            Pre-Anesthesia Assessment:                       - ASA Grade Assessment: III - A patient with severe                        systemic disease.                       After obtaining informed consent, the endoscope was                        passed under direct vision. Throughout the procedure,                        the patient's blood pressure, pulse, and oxygen                        saturations were monitored continuously. The Endoscope                        was introduced through the mouth, and advanced to the                        third part of duodenum. The upper GI endoscopy was                        accomplished without difficulty. The patient tolerated                        the procedure well. Findings:      The Z-line was variable. Biopsies were taken with a cold forceps for       histology.      multiple gastric ulcers and erosions are noted in the antrum, and       posterior body of the stomach and posterior region of the incisura.       These are improved in appearance from the previous evaluation. Biopsies       were taken of ulcers in the antrum, and posterior body of the stomach.       The largest of these was in the upper posterior body of the stomach,       multiple biopsies taken. No appearance of overt gastritis. The ulcers       noted in the fundus of the stomach were healed completely.  The exam of the esophagus was otherwise normal.      Two diminutive angioectasias without bleeding were found in the  second       portion of the duodenum. Impression:           - Z-line variable. Biopsied.                       - Two non-bleeding angioectasias in the duodenum. Recommendation:       - Discharge patient to home.                       - Use Protonix (pantoprazole) 40 mg PO BID daily. Procedure Code(s):    --- Professional ---                       [phone], Esophagogastroduodenoscopy, flexible, transoral;                        with biopsy, single or multiple Diagnosis Code(s):    --- Professional ---                       K22.8, Other specified diseases of esophagus                       K31.819, Angiodysplasia of stomach and duodenum without                        bleeding                       K25.9, Gastric ulcer, unspecified as acute or chronic,                        without hemorrhage or perforation CPT copyright 2017 American Medical Association. All rights reserved. The codes documented in this report are preliminary and upon coder review may  be revised to meet current compliance requirements. Lollie Sails, MD 03/21/2018 11:51:55 AM This report has been signed electronically. Number of Addenda: 0 Note Initiated On: 03/21/2018 11:17 AM      [facility]

## 2018-03-21 NOTE — H&P (Signed)
Outpatient short stay form Pre-procedure 03/21/2018 11:05 AM Lollie Sails MD  Primary Physician: Dr. Adline Potter  Reason for visit: EGD  History of present illness: Patient is a 77 year old female presenting today as above.  She had an EGD and colonoscopy done on 12/07/2017 for personal history of colon polyps as well as reflux and weight loss.  She had several small polyps in the colon that were hyperplastic.  However on the EGD she was found to have some significant ulcers in the gastric fundus and anterior wall of the gastric body/incision as well as posterior wall gastric body.  Biopsies were negative for malignancy or Helicobacter pylori.  Is returning for reevaluation today.  She has also subsequently been taken off of any aspirin product or NSAID.  Has continued to take Protonix 40 mg daily.  Her appetite is much better.  There is no further weight loss.    Current Facility-Administered Medications:  .  0.9 %  sodium chloride infusion, , Intravenous, Continuous, Lollie Sails, MD, Last Rate: 20 mL/hr at 03/21/18 1034, 1,000 mL at 03/21/18 1034 .  0.9 %  sodium chloride infusion, , Intravenous, Continuous, Lollie Sails, MD  Medications Prior to Admission  Medication Sig Dispense Refill Last Dose  . acetaminophen (TYLENOL) 500 MG tablet Take 1,000 mg by mouth 2 (two) times daily as needed.   Past Week at Unknown time  . Cyanocobalamin (B-12) 1000 MCG TABS Take 1 tablet by mouth daily. 30 tablet  Past Week at Unknown time  . ferrous sulfate 325 (65 FE) MG tablet Take 1 tablet (325 mg total) by mouth daily.  3 Past Week at Unknown time  . gabapentin (NEURONTIN) 100 MG capsule Take 1 capsule (100 mg total) by mouth at bedtime. 30 capsule 2 03/20/2018 at Unknown time  . lisinopril-hydrochlorothiazide (PRINZIDE,ZESTORETIC) 20-12.5 MG tablet Take 1 tablet by mouth daily. 90 tablet 3 03/20/2018 at Unknown time  . pantoprazole (PROTONIX) 40 MG tablet Take 1 tablet by mouth daily.    03/20/2018 at Unknown time     Allergies  Allergen Reactions  . Simvastatin Other (See Comments)     Past Medical History:  Diagnosis Date  . COPD (chronic obstructive pulmonary disease) ([place])   . Diabetes mellitus without complication ([place])   . Hyperlipidemia   . Hypertension   . Serum lipids high     Review of systems:      Physical Exam    Heart and lungs: Regular rate and rhythm without rub or gallop, lungs are bilaterally clear.    HEENT: Normocephalic atraumatic eyes are anicteric    Other:    Pertinant exam for procedure: Soft nontender nondistended bowel sounds positive normoactive.    Planned proceedures: EGD and indicated procedures. I have discussed the risks benefits and complications of procedures to include not limited to bleeding, infection, perforation and the risk of sedation and the patient wishes to proceed.    Lollie Sails, MD Gastroenterology 03/21/2018  11:05 AM

## 2018-03-21 NOTE — Anesthesia Postprocedure Evaluation (Signed)
Anesthesia Post Note  Patient: Angelica Walker  Procedure(s) Performed: ESOPHAGOGASTRODUODENOSCOPY (EGD) WITH PROPOFOL (N/A )  Patient location during evaluation: Endoscopy Anesthesia Type: General Level of consciousness: awake and alert Pain management: pain level controlled Vital Signs Assessment: post-procedure vital signs reviewed and stable Respiratory status: spontaneous breathing and respiratory function stable Cardiovascular status: stable Anesthetic complications: no     Last Vitals:  Vitals:   03/21/18 1148 03/21/18 1158  BP: (!) 94/49 (!) 115/53  Pulse:  91  Resp:  16  Temp: (!) 36.1 C   SpO2: 98% 98%    Last Pain:  Vitals:   03/21/18 1208  TempSrc:   PainSc: 0-No pain                 KEPHART,WILLIAM K

## 2018-03-22 ENCOUNTER — Other Ambulatory Visit: Payer: Self-pay | Admitting: Gastroenterology

## 2018-03-22 ENCOUNTER — Encounter: Payer: Self-pay | Admitting: Gastroenterology

## 2018-03-22 DIAGNOSIS — D509 Iron deficiency anemia, unspecified: Secondary | ICD-10-CM

## 2018-03-22 LAB — SURGICAL PATHOLOGY

## 2018-03-28 ENCOUNTER — Ambulatory Visit: Payer: Medicare Other | Admitting: Nurse Practitioner

## 2018-03-28 ENCOUNTER — Ambulatory Visit: Payer: Medicare Other

## 2018-03-28 ENCOUNTER — Ambulatory Visit
Admission: RE | Admit: 2018-03-28 | Discharge: 2018-03-28 | Disposition: A | Discharge status: Home / Self Care | Payer: Medicare Other | Source: Ambulatory Visit | Attending: Gastroenterology | Admitting: Gastroenterology

## 2018-03-28 DIAGNOSIS — K8020 Calculus of gallbladder without cholecystitis without obstruction: Secondary | ICD-10-CM | POA: Insufficient documentation

## 2018-03-28 DIAGNOSIS — D509 Iron deficiency anemia, unspecified: Secondary | ICD-10-CM | POA: Insufficient documentation

## 2018-04-22 DIAGNOSIS — D509 Iron deficiency anemia, unspecified: Secondary | ICD-10-CM | POA: Diagnosis not present

## 2018-04-22 DIAGNOSIS — Z720 Tobacco use: Secondary | ICD-10-CM | POA: Diagnosis not present

## 2018-04-22 DIAGNOSIS — K259 Gastric ulcer, unspecified as acute or chronic, without hemorrhage or perforation: Secondary | ICD-10-CM | POA: Diagnosis not present

## 2018-04-22 DIAGNOSIS — K210 Gastro-esophageal reflux disease with esophagitis: Secondary | ICD-10-CM | POA: Diagnosis not present

## 2018-04-22 DIAGNOSIS — D473 Essential (hemorrhagic) thrombocythemia: Secondary | ICD-10-CM | POA: Diagnosis not present

## 2018-04-25 DIAGNOSIS — H524 Presbyopia: Secondary | ICD-10-CM | POA: Diagnosis not present

## 2018-05-16 DIAGNOSIS — D509 Iron deficiency anemia, unspecified: Secondary | ICD-10-CM | POA: Diagnosis not present

## 2018-05-28 ENCOUNTER — Other Ambulatory Visit: Payer: Self-pay | Admitting: Family Medicine

## 2018-06-26 DIAGNOSIS — K259 Gastric ulcer, unspecified as acute or chronic, without hemorrhage or perforation: Secondary | ICD-10-CM | POA: Diagnosis not present

## 2018-06-26 DIAGNOSIS — D509 Iron deficiency anemia, unspecified: Secondary | ICD-10-CM | POA: Diagnosis not present

## 2018-07-02 ENCOUNTER — Inpatient Hospital Stay: Payer: Medicare Other | Attending: Internal Medicine | Admitting: Internal Medicine

## 2018-07-02 ENCOUNTER — Encounter: Payer: Self-pay | Admitting: Internal Medicine

## 2018-07-02 DIAGNOSIS — J449 Chronic obstructive pulmonary disease, unspecified: Secondary | ICD-10-CM | POA: Insufficient documentation

## 2018-07-02 DIAGNOSIS — D500 Iron deficiency anemia secondary to blood loss (chronic): Secondary | ICD-10-CM | POA: Diagnosis not present

## 2018-07-02 DIAGNOSIS — I10 Essential (primary) hypertension: Secondary | ICD-10-CM | POA: Insufficient documentation

## 2018-07-02 DIAGNOSIS — F17210 Nicotine dependence, cigarettes, uncomplicated: Secondary | ICD-10-CM | POA: Diagnosis not present

## 2018-07-02 DIAGNOSIS — Z79899 Other long term (current) drug therapy: Secondary | ICD-10-CM | POA: Diagnosis not present

## 2018-07-02 NOTE — Assessment & Plan Note (Addendum)
#   Iron deficient anemia hemoglobin-likely secondary upper GI bleed.  Improving most recent 10 [July 2019/PCP]; however iron studies still iron deficient.  Patient is not very symptomatic.  Very active.  # I would recommend holding off any iron infusion at this time; as patient is clinically doing well.  She is tolerating iron pill every other day very well.  #Upper GI bleed/gastric ulcers-status post EGD colonoscopy.  Defer to GI for further management.  #Recommend patient follow-up with [place] in approximately 3 months labs; iron studies.  Will decide on IV iron at the time.  Thank you Ms.London NP for allowing me to participate in the care of your pleasant patient. Please do not hesitate to contact me with questions or concerns in the interim.

## 2018-07-02 NOTE — Progress Notes (Signed)
[place] NOTE  Patient Care Team: Juline Patch, MD as PCP - General (Family Medicine) Ok Edwards, NP as Nurse Practitioner (Gastroenterology)  CHIEF COMPLAINTS/PURPOSE OF CONSULTATION:  Iron deficient anemia  #Iron deficiency anemia-upper GI bleed/gastric ulcer [April 2019]  #April 2019-gastric ulcer/likely NSAID related [status post EGD/colonoscopy]  No history exists.     HISTORY OF PRESENTING ILLNESS:  Angelica Walker 77 y.o.  female history of iron deficient anemia is here for further recommendations for ongoing iron deficiency anemia.  Patient states that she had episode of abdominal discomfort/nausea-that led to further work-up with EGD-that showed gastric ulcer.  Likely NSAID related.  Patient's hemoglobin was around 7 at the time.  patient has stopped taking NSAIDs; she is currently on p.o. iron every other day.  Patient has good energy levels.  She mows her own lawn at this time.  She is tolerating iron pills very well except for mild black or stools.  Otherwise no nausea no vomiting.  No abdominal pain.  She feels fairly back to baseline health.  Review of Systems  Constitutional: Negative for chills, diaphoresis, fever, malaise/fatigue and weight loss.  HENT: Negative for nosebleeds and sore throat.   Eyes: Negative for double vision.  Respiratory: Negative for cough, hemoptysis, sputum production, shortness of breath and wheezing.   Cardiovascular: Negative for chest pain, palpitations, orthopnea and leg swelling.  Gastrointestinal: Positive for abdominal pain and nausea. Negative for blood in stool, constipation, diarrhea, heartburn, melena and vomiting.  Genitourinary: Negative for dysuria, frequency and urgency.  Musculoskeletal: Negative for back pain and joint pain.  Skin: Negative.  Negative for itching and rash.  Neurological: Negative for dizziness, tingling, focal weakness, weakness and headaches.  Endo/Heme/Allergies:  Does not bruise/bleed easily.  Psychiatric/Behavioral: Negative for depression. The patient is not nervous/anxious and does not have insomnia.      MEDICAL HISTORY:  Past Medical History:  Diagnosis Date  . COPD (chronic obstructive pulmonary disease) ([place])   . Diabetes mellitus without complication ([place])   . Hyperlipidemia   . Hypertension   . Serum lipids high     SURGICAL HISTORY: Past Surgical History:  Procedure Laterality Date  . COLONOSCOPY WITH PROPOFOL N/A 12/07/2017   Procedure: COLONOSCOPY WITH PROPOFOL;  Surgeon: Lollie Sails, MD;  Location: [facility] ENDOSCOPY;  Service: Endoscopy;  Laterality: N/A;  . ESOPHAGOGASTRODUODENOSCOPY (EGD) WITH PROPOFOL N/A 12/07/2017   Procedure: ESOPHAGOGASTRODUODENOSCOPY (EGD) WITH PROPOFOL;  Surgeon: Lollie Sails, MD;  Location: [facility] ENDOSCOPY;  Service: Endoscopy;  Laterality: N/A;  . ESOPHAGOGASTRODUODENOSCOPY (EGD) WITH PROPOFOL N/A 03/21/2018   Procedure: ESOPHAGOGASTRODUODENOSCOPY (EGD) WITH PROPOFOL;  Surgeon: Lollie Sails, MD;  Location: [facility] ENDOSCOPY;  Service: Endoscopy;  Laterality: N/A;    SOCIAL HISTORY: Social History   Socioeconomic History  . Marital status: Divorced    Spouse name: Not on file  . Number of children: 1  . Years of education: some college  . Highest education level: 12th grade  Occupational History  . Occupation: Retired  Scientific laboratory technician  . Financial resource strain: Not hard at all  . Food insecurity:    Worry: Never true    Inability: Never true  . Transportation needs:    Medical: No    Non-medical: No  Tobacco Use  . Smoking status: Current Every Day Smoker    Packs/day: 1.00    Years: 54.00    Pack years: 54.00    Types: Cigarettes  . Smokeless tobacco: Never Used  Substance and Sexual  Activity  . Alcohol use: No    Frequency: Never  . Drug use: No  . Sexual activity: Not Currently  Lifestyle  . Physical activity:    Days per week: 0 days    Minutes per session: 0 min   . Stress: Not at all  Relationships  . Social connections:    Talks on phone: Patient refused    Gets together: Patient refused    Attends religious service: Patient refused    Active member of club or organization: Patient refused    Attends meetings of clubs or organizations: Patient refused    Relationship status: Patient refused  . Intimate partner violence:    Fear of current or ex partner: No    Emotionally abused: No    Physically abused: No    Forced sexual activity: No  Other Topics Concern  . Not on file  Social History Narrative  . Not on file    FAMILY HISTORY: Family History  Problem Relation Age of Onset  . Breast cancer Maternal Aunt     ALLERGIES:  is allergic to simvastatin.  MEDICATIONS:  Current Outpatient Medications  Medication Sig Dispense Refill  . acetaminophen (TYLENOL) 500 MG tablet Take 1,000 mg by mouth 2 (two) times daily as needed.    . Ascorbic Acid (VITAMIN C) 100 MG tablet Take 100 mg by mouth daily.    . Cyanocobalamin (B-12) 1000 MCG TABS Take 1 tablet by mouth daily. 30 tablet   . ferrous sulfate 325 (65 FE) MG tablet Take 1 tablet (325 mg total) by mouth daily.  3  . gabapentin (NEURONTIN) 100 MG capsule TAKE 1 CAPSULE BY MOUTH AT BEDTIME 90 capsule 0  . lisinopril-hydrochlorothiazide (PRINZIDE,ZESTORETIC) 20-12.5 MG tablet Take 1 tablet by mouth daily. 90 tablet 3  . pantoprazole (PROTONIX) 40 MG tablet Take 1 tablet by mouth daily.     No current facility-administered medications for this visit.       Marland Kitchen  PHYSICAL EXAMINATION: ECOG PERFORMANCE STATUS: 1 - Symptomatic but completely ambulatory  Vitals:   07/02/18 1514 07/02/18 1516  BP:  (!) 158/55  Pulse:  79  Resp: 16 16  Temp:  97.9 F (36.6 C)   Filed Weights   07/02/18 1514  Weight: 131 lb 8.1 oz (59.6 kg)    GENERAL: Well-nourished well-developed; Alert, no distress and comfortable.  She is alone.    EYES: no pallor or icterus OROPHARYNX: no thrush or  ulceration; NECK: supple; no lymph nodes felt. LYMPH:  no palpable lymphadenopathy in the axillary or inguinal regions LUNGS: Decreased breath sounds auscultation bilaterally. No wheeze or crackles HEART/CVS: regular rate & rhythm and no murmurs; No lower extremity edema ABDOMEN:abdomen soft, non-tender and normal bowel sounds. No hepatomegaly or splenomegaly.  Musculoskeletal:no cyanosis of digits and no clubbing  PSYCH: alert & oriented x 3 with fluent speech NEURO: no focal motor/sensory deficits SKIN:  no rashes or significant lesions  LABORATORY DATA:  I have reviewed the data as listed Lab Results  Component Value Date   WBC 7.5 03/18/2018   HGB 9.3 (L) 03/18/2018   HCT 32.0 (L) 03/18/2018   MCV 75 (L) 03/18/2018   PLT 465 (H) 03/18/2018   Recent Labs    12/19/17 1621  NA 136  K 3.9  CL 97  CO2 24  GLUCOSE 111*  BUN 6*  CREATININE 0.67  CALCIUM 9.4  GFRNONAA 87  GFRAA 100  PROT 7.1  ALBUMIN 4.3  AST 18  ALT 8  ALKPHOS 87  BILITOT 0.4    RADIOGRAPHIC STUDIES: I have personally reviewed the radiological images as listed and agreed with the findings in the report. No results found.  ASSESSMENT & PLAN:   Iron deficiency anemia due to chronic blood loss # Iron deficient anemia hemoglobin-likely secondary upper GI bleed.  Improving most recent 10 [July 2019/PCP]; however iron studies still iron deficient.  Patient is not very symptomatic.  Very active.  # I would recommend holding off any iron infusion at this time; as patient is clinically doing well.  She is tolerating iron pill every other day very well.  #Upper GI bleed/gastric ulcers-status post EGD colonoscopy.  Defer to GI for further management.  #Recommend patient follow-up with [place] in approximately 3 months labs; iron studies.  Will decide on IV iron at the time.  Thank you Ms.London NP for allowing me to participate in the care of your pleasant patient. Please do not hesitate to contact me with  questions or concerns in the interim.    All questions were answered. The patient knows to call the clinic with any problems, questions or concerns.       Cammie Sickle, MD 07/02/2018 4:09 PM

## 2018-07-19 ENCOUNTER — Encounter: Payer: Self-pay | Admitting: Emergency Medicine

## 2018-07-22 ENCOUNTER — Ambulatory Visit: Payer: Medicare Other | Admitting: Certified Registered"

## 2018-07-22 ENCOUNTER — Other Ambulatory Visit: Payer: Self-pay

## 2018-07-22 ENCOUNTER — Encounter
Admission: RE | Disposition: A | Discharge status: Home / Self Care | Payer: Self-pay | Source: Ambulatory Visit | Attending: Gastroenterology

## 2018-07-22 ENCOUNTER — Ambulatory Visit: Admit: 2018-07-22 | Payer: Medicare Other | Admitting: Internal Medicine

## 2018-07-22 ENCOUNTER — Ambulatory Visit
Admission: RE | Admit: 2018-07-22 | Discharge: 2018-07-22 | Disposition: A | Discharge status: Home / Self Care | Payer: Medicare Other | Source: Ambulatory Visit | Attending: Gastroenterology | Admitting: Gastroenterology

## 2018-07-22 DIAGNOSIS — Z79899 Other long term (current) drug therapy: Secondary | ICD-10-CM | POA: Diagnosis not present

## 2018-07-22 DIAGNOSIS — E119 Type 2 diabetes mellitus without complications: Secondary | ICD-10-CM | POA: Diagnosis not present

## 2018-07-22 DIAGNOSIS — K259 Gastric ulcer, unspecified as acute or chronic, without hemorrhage or perforation: Secondary | ICD-10-CM | POA: Insufficient documentation

## 2018-07-22 DIAGNOSIS — K31811 Angiodysplasia of stomach and duodenum with bleeding: Secondary | ICD-10-CM | POA: Diagnosis not present

## 2018-07-22 DIAGNOSIS — I10 Essential (primary) hypertension: Secondary | ICD-10-CM | POA: Insufficient documentation

## 2018-07-22 DIAGNOSIS — J449 Chronic obstructive pulmonary disease, unspecified: Secondary | ICD-10-CM | POA: Insufficient documentation

## 2018-07-22 DIAGNOSIS — D509 Iron deficiency anemia, unspecified: Secondary | ICD-10-CM | POA: Insufficient documentation

## 2018-07-22 DIAGNOSIS — E785 Hyperlipidemia, unspecified: Secondary | ICD-10-CM | POA: Insufficient documentation

## 2018-07-22 DIAGNOSIS — K2950 Unspecified chronic gastritis without bleeding: Secondary | ICD-10-CM | POA: Diagnosis not present

## 2018-07-22 DIAGNOSIS — Q2733 Arteriovenous malformation of digestive system vessel: Secondary | ICD-10-CM | POA: Diagnosis not present

## 2018-07-22 DIAGNOSIS — K228 Other specified diseases of esophagus: Secondary | ICD-10-CM | POA: Diagnosis not present

## 2018-07-22 DIAGNOSIS — K31819 Angiodysplasia of stomach and duodenum without bleeding: Secondary | ICD-10-CM | POA: Diagnosis not present

## 2018-07-22 DIAGNOSIS — K3189 Other diseases of stomach and duodenum: Secondary | ICD-10-CM | POA: Diagnosis not present

## 2018-07-22 HISTORY — PX: ESOPHAGOGASTRODUODENOSCOPY (EGD) WITH PROPOFOL: SHX5813

## 2018-07-22 SURGERY — ESOPHAGOGASTRODUODENOSCOPY (EGD) WITH PROPOFOL
Anesthesia: General

## 2018-07-22 MED ORDER — SODIUM CHLORIDE 0.9 % IV SOLN
INTRAVENOUS | Status: DC
  Administered 2018-07-22: 11:00:00 via INTRAVENOUS

## 2018-07-22 MED ORDER — PROPOFOL 500 MG/50ML IV EMUL
INTRAVENOUS | Status: DC | PRN
  Administered 2018-07-22: 130 ug/kg/min via INTRAVENOUS

## 2018-07-22 MED ORDER — PROPOFOL 10 MG/ML IV BOLUS
INTRAVENOUS | Status: DC | PRN
  Administered 2018-07-22: 60 mg via INTRAVENOUS

## 2018-07-22 MED ORDER — LIDOCAINE HCL (CARDIAC) PF 100 MG/5ML IV SOSY
PREFILLED_SYRINGE | INTRAVENOUS | Status: DC | PRN
  Administered 2018-07-22: 80 mg via INTRAVENOUS

## 2018-07-22 MED ORDER — SODIUM CHLORIDE 0.9 % IV SOLN: INTRAVENOUS | Status: DC

## 2018-07-22 MED ORDER — PHENYLEPHRINE HCL 10 MG/ML IJ SOLN
INTRAMUSCULAR | Status: DC | PRN
  Administered 2018-07-22 (×9): 100 ug via INTRAVENOUS

## 2018-07-22 NOTE — Op Note (Signed)
[facility] Gastroenterology Patient Name: Angelica Walker Procedure Date: 07/22/2018 10:56 AM MRN: [id number] Account #: [id number] Date of Birth: 05-23-1941 Admit Type: Outpatient Age: 77 Room: [facility] ENDO ROOM 1 Gender: Female Note Status: Finalized Procedure:            Upper GI endoscopy Indications:          Iron deficiency anemia Providers:            Lollie Sails, MD Referring MD:         Juline Patch, MD (Referring MD) Medicines:            Monitored Anesthesia Care Complications:        No immediate complications. Procedure:            Pre-Anesthesia Assessment:                       - ASA Grade Assessment: III - A patient with severe                        systemic disease.                       After obtaining informed consent, the endoscope was                        passed under direct vision. Throughout the procedure,                        the patient's blood pressure, pulse, and oxygen                        saturations were monitored continuously. The Endoscope                        was introduced through the mouth, and advanced to the                        fourth part of duodenum. The upper GI endoscopy was                        accomplished without difficulty. The patient tolerated                        the procedure well. Findings:      The Z-line was variable. Biopsies were taken with a cold forceps for       histology.      Three non-bleeding cratered gastric ulcers with no stigmata of bleeding       were found in the prepyloric region of the stomach and at the pylorus.       The largest lesion was 4 mm in largest dimension. Biopsies were taken       with a cold forceps for Helicobacter pylori testing.      A deformity was found on the posterior wall of the gastric antrum, this       consistant with a healed ulcer site. . Biopsies were taken with a cold       forceps for histology.      The cardia and gastric fundus were normal on  retroflexion.      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 Fulguration to ablate the lesion       by argon plasma at 0.5 liters/minute and 20 watts was successful.      The exam of the duodenum was otherwise normal. Impression:           - Z-line variable. Biopsied.                       - Non-bleeding gastric ulcers with no stigmata of                        bleeding. Biopsied.                       - Acquired deformity in the gastric body (posterior                        wall). Biopsied.                       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 Treated with                        argon plasma coagulation (APC). Recommendation:       - Discharge patient to home.                       - Use Protonix (pantoprazole) 40 mg PO BID daily.                       - Use sucralfate tablets 1 gram PO QID daily.                       - Return to GI clinic in 1 month.                       - Return to GI clinic in 1 month.                       - Patient to have cbc and iron studies before                        outpatient office visit. Procedure Code(s):    --- Professional ---                       [phone], Esophagogastroduodenoscopy, flexible, transoral;                        with biopsy, single or multiple Diagnosis Code(s):    --- Professional ---                       K22.8, Other specified diseases of esophagus                       K25.9, Gastric ulcer, unspecified as acute or chronic,                        without hemorrhage or perforation                       K31.89,  Other diseases of stomach and duodenum                       K31.819, Angiodysplasia of stomach and duodenum without                        bleeding                       D50.9, Iron deficiency anemia, unspecified CPT copyright 2017 American Medical Association. All rights reserved. The codes documented in this report are preliminary and upon coder  review may  be revised to meet current compliance requirements. Lollie Sails, MD 07/22/2018 11:55:15 AM This report has been signed electronically. Number of Addenda: 0 Note Initiated On: 07/22/2018 10:56 AM      [facility]

## 2018-07-22 NOTE — Anesthesia Preprocedure Evaluation (Signed)
Anesthesia Evaluation  Patient identified by MRN, date of birth, ID band Patient awake    Reviewed: Allergy & Precautions, H&P , NPO status , Patient's Chart, lab work & pertinent test results, reviewed documented beta blocker date and time   Airway Mallampati: II   Neck ROM: full    Dental  (+) Teeth Intact   Pulmonary neg pulmonary ROS, COPD,  COPD inhaler, Current Smoker,    Pulmonary exam normal        Cardiovascular Exercise Tolerance: Good hypertension, On Medications negative cardio ROS Normal cardiovascular exam Rhythm:regular Rate:Normal     Neuro/Psych negative neurological ROS  negative psych ROS   GI/Hepatic negative GI ROS, Neg liver ROS, PUD,   Endo/Other  negative endocrine ROSdiabetes, Well Controlled, Type 2, Oral Hypoglycemic Agents  Renal/GU negative Renal ROS  negative genitourinary   Musculoskeletal   Abdominal   Peds  Hematology negative hematology ROS (+) anemia ,   Anesthesia Other Findings Past Medical History: No date: COPD (chronic obstructive pulmonary disease) ([facility]) No date: Diabetes mellitus without complication ([facility]) No date: Hyperlipidemia No date: Hypertension No date: Serum lipids high Past Surgical History: 12/07/2017: COLONOSCOPY WITH PROPOFOL; N/A     Comment:  Procedure: COLONOSCOPY WITH PROPOFOL;  Surgeon:               Lollie Sails, MD;  Location: [facility] ENDOSCOPY;                Service: Endoscopy;  Laterality: N/A; 12/07/2017: ESOPHAGOGASTRODUODENOSCOPY (EGD) WITH PROPOFOL; N/A     Comment:  Procedure: ESOPHAGOGASTRODUODENOSCOPY (EGD) WITH               PROPOFOL;  Surgeon: Lollie Sails, MD;  Location:               [facility] ENDOSCOPY;  Service: Endoscopy;  Laterality: N/A; 03/21/2018: ESOPHAGOGASTRODUODENOSCOPY (EGD) WITH PROPOFOL; N/A     Comment:  Procedure: ESOPHAGOGASTRODUODENOSCOPY (EGD) WITH               PROPOFOL;  Surgeon: Lollie Sails, MD;   Location:               [facility] ENDOSCOPY;  Service: Endoscopy;  Laterality: N/A; BMI    Body Mass Index:  21.80 kg/m     Reproductive/Obstetrics negative OB ROS                             Anesthesia Physical Anesthesia Plan  ASA: III  Anesthesia Plan: General   Post-op Pain Management:    Induction:   PONV Risk Score and Plan:   Airway Management Planned:   Additional Equipment:   Intra-op Plan:   Post-operative Plan:   Informed Consent: I have reviewed the patients History and Physical, chart, labs and discussed the procedure including the risks, benefits and alternatives for the proposed anesthesia with the patient or authorized representative who has indicated his/her understanding and acceptance.   Dental Advisory Given  Plan Discussed with: CRNA  Anesthesia Plan Comments:         Anesthesia Quick Evaluation

## 2018-07-22 NOTE — H&P (Signed)
Outpatient short stay form Pre-procedure 07/22/2018 10:49 AM Lollie Sails MD  Primary Physician: Dr. Otilio Miu  Reason for visit: EGD  History of present illness: Patient is a 77 year old female presenting today as above.  She has personal history of iron deficiency anemia and was had been last seen on 04/22/2018 for evaluation.  He had a EGD and colonoscopy done 12/07/2017 showing multiple gastric ulcers probably NSAID related.  She had some small hyperplastic polyps on her colonoscopy.  Is also some reflux esophagitis.  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  The AVMs were quite tiny and nonbleeding however she had a significant ulcer stomach.  Been directed to continue Protonix 40 mg twice a day and we arrange for her to have this follow-up EGD.  She is currently taking a PPI twice daily.  She is now also avoiding NSAIDs or aspirin products.   No current facility-administered medications for this encounter.   Medications Prior to Admission  Medication Sig Dispense Refill Last Dose  . Calcium 600-200 MG-UNIT tablet Take 1 tablet by mouth 2 (two) times daily.     . Misc Natural Products (GLUCOSAMINE-CHONDROITIN SULF) TABS Take by mouth.     . sucralfate (CARAFATE) 1 g tablet Take 1 g by mouth 4 (four) times daily -  with meals and at bedtime.     Marland Kitchen acetaminophen (TYLENOL) 500 MG tablet Take 1,000 mg by mouth 2 (two) times daily as needed.   Taking  . Ascorbic Acid (VITAMIN C) 100 MG tablet Take 100 mg by mouth daily.   Taking  . Cyanocobalamin (B-12) 1000 MCG TABS Take 1 tablet by mouth daily. 30 tablet  Taking  . ferrous sulfate 325 (65 FE) MG tablet Take 1 tablet (325 mg total) by mouth daily.  3 Taking  . gabapentin (NEURONTIN) 100 MG capsule TAKE 1 CAPSULE BY MOUTH AT BEDTIME 90 capsule 0 Taking  . lisinopril-hydrochlorothiazide (PRINZIDE,ZESTORETIC) 20-12.5 MG tablet Take 1 tablet by  mouth daily. 90 tablet 3 Taking  . pantoprazole (PROTONIX) 40 MG tablet Take 1 tablet by mouth daily.   Taking     Allergies  Allergen Reactions  . Simvastatin Other (See Comments)     Past Medical History:  Diagnosis Date  . COPD (chronic obstructive pulmonary disease) ([place])   . Diabetes mellitus without complication ([place])   . Hyperlipidemia   . Hypertension   . Serum lipids high     Review of systems:      Physical Exam    Heart and lungs: Regular rate and rhythm without rub or gallop, lungs are bilaterally clear.    HEENT: Normocephalic atraumatic eyes are anicteric    Other:    Pertinant exam for procedure: Soft nontender nondistended bowel sounds positive normoactive    Planned proceedures: EGD and indicated procedures. I have discussed the risks benefits and complications of procedures to include not limited to bleeding, infection, perforation and the risk of sedation and the patient wishes to proceed.    Lollie Sails, MD Gastroenterology 07/22/2018  10:49 AM

## 2018-07-22 NOTE — Anesthesia Procedure Notes (Signed)
Performed by: Lance Muss, CRNA Pre-anesthesia Checklist: Emergency Drugs available, Suction available, Patient being monitored, Timeout performed and Patient identified Patient Re-evaluated:Patient Re-evaluated prior to induction Oxygen Delivery Method: Nasal cannula Induction Type: IV induction

## 2018-07-22 NOTE — Anesthesia Post-op Follow-up Note (Signed)
Anesthesia QCDR form completed.        

## 2018-07-22 NOTE — Transfer of Care (Signed)
Immediate Anesthesia Transfer of Care Note  Patient: Angelica Walker  Procedure(s) Performed: ESOPHAGOGASTRODUODENOSCOPY (EGD) WITH PROPOFOL (N/A )  Patient Location: PACU  Anesthesia Type:General  Level of Consciousness: awake and responds to stimulation  Airway & Oxygen Therapy: Patient Spontanous Breathing  Post-op Assessment: Report given to RN and Post -op Vital signs reviewed and stable  Post vital signs: Reviewed and stable  Last Vitals:  Vitals Value Taken Time  BP 140/54 07/22/2018 12:02 PM  Temp 36.1 C 07/22/2018 11:49 AM  Pulse 71 07/22/2018 12:02 PM  Resp 14 07/22/2018 12:02 PM  SpO2 100 % 07/22/2018 12:02 PM    Last Pain:  Vitals:   07/22/18 1159  TempSrc:   PainSc: 0-No pain         Complications: No apparent anesthesia complications

## 2018-07-23 ENCOUNTER — Encounter: Payer: Self-pay | Admitting: Gastroenterology

## 2018-07-23 NOTE — Anesthesia Postprocedure Evaluation (Signed)
Anesthesia Post Note  Patient: Angelica Walker  Procedure(s) Performed: ESOPHAGOGASTRODUODENOSCOPY (EGD) WITH PROPOFOL (N/A )  Anesthesia Type: General     Last Vitals:  Vitals:   07/22/18 1159 07/22/18 1202  BP: (!) 140/54 (!) 140/54  Pulse:  71  Resp:  14  Temp:    SpO2:  100%    Last Pain:  Vitals:   07/23/18 0747  TempSrc:   PainSc: 0-No pain                 Molli Barrows

## 2018-07-24 LAB — SURGICAL PATHOLOGY

## 2018-08-01 ENCOUNTER — Encounter: Payer: Self-pay | Admitting: Gastroenterology

## 2018-08-21 ENCOUNTER — Other Ambulatory Visit: Payer: Self-pay | Admitting: Family Medicine

## 2018-08-28 DIAGNOSIS — D509 Iron deficiency anemia, unspecified: Secondary | ICD-10-CM | POA: Diagnosis not present

## 2018-09-04 DIAGNOSIS — D500 Iron deficiency anemia secondary to blood loss (chronic): Secondary | ICD-10-CM | POA: Diagnosis not present

## 2018-09-04 DIAGNOSIS — K259 Gastric ulcer, unspecified as acute or chronic, without hemorrhage or perforation: Secondary | ICD-10-CM | POA: Diagnosis not present

## 2018-09-12 DIAGNOSIS — K259 Gastric ulcer, unspecified as acute or chronic, without hemorrhage or perforation: Secondary | ICD-10-CM | POA: Diagnosis not present

## 2018-09-12 DIAGNOSIS — D500 Iron deficiency anemia secondary to blood loss (chronic): Secondary | ICD-10-CM | POA: Diagnosis not present

## 2018-09-17 ENCOUNTER — Encounter: Payer: Self-pay | Admitting: Family Medicine

## 2018-09-17 ENCOUNTER — Ambulatory Visit (INDEPENDENT_AMBULATORY_CARE_PROVIDER_SITE_OTHER): Payer: Medicare Other | Admitting: Family Medicine

## 2018-09-17 VITALS — BP 90/60 | HR 76 | Ht 65.0 in | Wt 129.0 lb

## 2018-09-17 DIAGNOSIS — R7303 Prediabetes: Secondary | ICD-10-CM | POA: Diagnosis not present

## 2018-09-17 DIAGNOSIS — F17200 Nicotine dependence, unspecified, uncomplicated: Secondary | ICD-10-CM

## 2018-09-17 DIAGNOSIS — I10 Essential (primary) hypertension: Secondary | ICD-10-CM

## 2018-09-17 DIAGNOSIS — G2581 Restless legs syndrome: Secondary | ICD-10-CM | POA: Diagnosis not present

## 2018-09-17 MED ORDER — GABAPENTIN 100 MG PO CAPS: 100.0000 mg | ORAL_CAPSULE | Freq: Every day | ORAL | 1 refills | Status: DC

## 2018-09-17 MED ORDER — LISINOPRIL-HYDROCHLOROTHIAZIDE 20-12.5 MG PO TABS: 1.0000 | ORAL_TABLET | Freq: Every day | ORAL | 1 refills | Status: DC

## 2018-09-17 NOTE — Progress Notes (Signed)
Date:  09/17/2018   Name:  Angelica Walker   DOB:  1940/12/25   MRN:  [id number]   Chief Complaint: Hypertension; restless leg (takes gabapentin); and prediabetes (get A1C) Hypertension  This is a chronic problem. The current episode started more than 1 year ago. The problem is unchanged. The problem is controlled. Pertinent negatives include no anxiety, blurred vision, chest pain, headaches, malaise/fatigue, neck pain, orthopnea, palpitations, peripheral edema, PND, shortness of breath or sweats. There are no associated agents to hypertension. Risk factors for coronary artery disease include dyslipidemia. Past treatments include ACE inhibitors and diuretics. The current treatment provides moderate improvement. There are no compliance problems.  There is no history of angina, kidney disease, CAD/MI, CVA, heart failure, left ventricular hypertrophy, PVD or retinopathy. There is no history of chronic renal disease, a hypertension causing med or renovascular disease.  Neurologic Problem  The patient's pertinent negatives include no altered mental status, clumsiness, focal sensory loss, focal weakness, loss of balance, memory loss, near-syncope, slurred speech, syncope, visual change or weakness. Primary symptoms comment: for restless leg. This is a recurrent problem. The current episode started more than 1 year ago. The problem is unchanged. Pertinent negatives include no abdominal pain, back pain, chest pain, dizziness, fatigue, fever, headaches, light-headedness, nausea, neck pain, palpitations, shortness of breath or vomiting.     Review of Systems  Constitutional: Negative.  Negative for chills, fatigue, fever, malaise/fatigue and unexpected weight change.  HENT: Negative for congestion, ear discharge, ear pain, rhinorrhea, sinus pressure, sneezing and sore throat.   Eyes: Negative for blurred vision, photophobia, pain, discharge, redness and itching.  Respiratory: Negative for cough, shortness of  breath, wheezing and stridor.   Cardiovascular: Negative for chest pain, palpitations, orthopnea, PND and near-syncope.  Gastrointestinal: Negative for abdominal pain, blood in stool, constipation, diarrhea, nausea and vomiting.  Endocrine: Negative for cold intolerance, heat intolerance, polydipsia, polyphagia and polyuria.  Genitourinary: Negative for dysuria, flank pain, frequency, hematuria, menstrual problem, pelvic pain, urgency, vaginal bleeding and vaginal discharge.  Musculoskeletal: Negative for arthralgias, back pain, myalgias and neck pain.  Skin: Negative for rash.  Allergic/Immunologic: Negative for environmental allergies and food allergies.  Neurological: Negative for dizziness, focal weakness, syncope, weakness, light-headedness, numbness, headaches and loss of balance.  Hematological: Negative for adenopathy. Does not bruise/bleed easily.  Psychiatric/Behavioral: Negative for dysphoric mood and memory loss. The patient is not nervous/anxious.     Patient Active Problem List   Diagnosis Date Noted  . Iron deficiency anemia due to chronic blood loss 07/02/2018  . Smoker 03/18/2018  . RLS (restless legs syndrome) 01/18/2018  . Anemia due to blood loss 12/20/2017  . Prediabetes 12/19/2017  . Hyperlipidemia, unspecified 12/19/2017  . Hypertension, essential, benign 12/19/2017  . B12 deficiency 12/19/2017  . DDD (degenerative disc disease), lumbar 12/19/2017  . Multiple gastric ulcers 12/19/2017  . COPD (chronic obstructive pulmonary disease) ([place]) 12/21/2014    Allergies  Allergen Reactions  . Simvastatin Other (See Comments)    Past Surgical History:  Procedure Laterality Date  . COLONOSCOPY WITH PROPOFOL N/A 12/07/2017   Procedure: COLONOSCOPY WITH PROPOFOL;  Surgeon: Lollie Sails, MD;  Location: [facility] ENDOSCOPY;  Service: Endoscopy;  Laterality: N/A;  . ESOPHAGOGASTRODUODENOSCOPY (EGD) WITH PROPOFOL N/A 12/07/2017   Procedure: ESOPHAGOGASTRODUODENOSCOPY  (EGD) WITH PROPOFOL;  Surgeon: Lollie Sails, MD;  Location: [facility] ENDOSCOPY;  Service: Endoscopy;  Laterality: N/A;  . ESOPHAGOGASTRODUODENOSCOPY (EGD) WITH PROPOFOL N/A 03/21/2018   Procedure: ESOPHAGOGASTRODUODENOSCOPY (EGD) WITH PROPOFOL;  Surgeon: Gustavo Lah,  Billie Ruddy, MD;  Location: [facility] ENDOSCOPY;  Service: Endoscopy;  Laterality: N/A;  . ESOPHAGOGASTRODUODENOSCOPY (EGD) WITH PROPOFOL N/A 07/22/2018   Procedure: ESOPHAGOGASTRODUODENOSCOPY (EGD) WITH PROPOFOL;  Surgeon: Lollie Sails, MD;  Location: [facility] ENDOSCOPY;  Service: Endoscopy;  Laterality: N/A;    Social History   Tobacco Use  . Smoking status: Current Every Day Smoker    Packs/day: 1.00    Years: 54.00    Pack years: 54.00    Types: Cigarettes  . Smokeless tobacco: Never Used  . Tobacco comment: patches and pills discussed  Substance Use Topics  . Alcohol use: No    Frequency: Never  . Drug use: No     Medication list has been reviewed and updated.  Current Meds  Medication Sig  . acetaminophen (TYLENOL) 500 MG tablet Take 1,000 mg by mouth 2 (two) times daily as needed.  . Ascorbic Acid (VITAMIN C) 100 MG tablet Take 100 mg by mouth daily.  . Calcium 600-200 MG-UNIT tablet Take 1 tablet by mouth daily.   . Cyanocobalamin (B-12) 1000 MCG TABS Take 1 tablet by mouth daily.  . ferrous sulfate 325 (65 FE) MG tablet Take 1 tablet (325 mg total) by mouth daily.  Marland Kitchen gabapentin (NEURONTIN) 100 MG capsule Take 1 capsule (100 mg total) by mouth at bedtime.  Marland Kitchen lisinopril-hydrochlorothiazide (PRINZIDE,ZESTORETIC) 20-12.5 MG tablet Take 1 tablet by mouth daily.  . pantoprazole (PROTONIX) 40 MG tablet Take 1 tablet by mouth daily. [place]  . sucralfate (CARAFATE) 1 g tablet Take 1 g by mouth 2 (two) times daily.   . [DISCONTINUED] gabapentin (NEURONTIN) 100 MG capsule TAKE 1 CAPSULE BY MOUTH AT BEDTIME.  . [DISCONTINUED] lisinopril-hydrochlorothiazide (PRINZIDE,ZESTORETIC) 20-12.5 MG tablet Take 1 tablet by mouth daily.     PHQ 2/9 Scores 03/13/2018 12/19/2017  PHQ - 2 Score 0 0  PHQ- 9 Score 0 -    Physical Exam  Constitutional: She is oriented to person, place, and time. She appears well-developed and well-nourished.  HENT:  Head: Normocephalic.  Right Ear: External ear normal.  Left Ear: External ear normal.  Mouth/Throat: Oropharynx is clear and moist.  Eyes: Pupils are equal, round, and reactive to light. Conjunctivae and EOM are normal. Lids are everted and swept, no foreign bodies found. Left eye exhibits no hordeolum. No foreign body present in the left eye. Right conjunctiva is not injected. Left conjunctiva is not injected. No scleral icterus.  Neck: Normal range of motion. Neck supple. No JVD present. No tracheal deviation present. No thyromegaly present.  Cardiovascular: Normal rate, regular rhythm, normal heart sounds and intact distal pulses. Exam reveals no gallop and no friction rub.  No murmur heard. Pulmonary/Chest: Effort normal and breath sounds normal. No respiratory distress. She has no wheezes. She has no rales.  Abdominal: Soft. Bowel sounds are normal. She exhibits no mass. There is no hepatosplenomegaly. There is no tenderness. There is no rebound and no guarding.  Musculoskeletal: Normal range of motion. She exhibits no edema or tenderness.  Feet:  Right Foot:  Protective Sensation: 4 sites tested. 4 sites sensed.  Skin Integrity: Negative for ulcer, blister, skin breakdown, erythema, warmth, callus or dry skin.  Left Foot:  Protective Sensation: 4 sites tested. 4 sites sensed.  Skin Integrity: Negative for ulcer, blister, skin breakdown, erythema, warmth, callus or dry skin.  Lymphadenopathy:    She has no cervical adenopathy.  Neurological: She is alert and oriented to person, place, and time. She has normal strength. She displays normal reflexes.  No cranial nerve deficit.  Skin: Skin is warm. No rash noted.  Psychiatric: She has a normal mood and affect. Her mood appears  not anxious. She does not exhibit a depressed mood.  Nursing note and vitals reviewed.   BP 90/60   Pulse 76   Ht 5\' 5"  (1.651 m)   Wt 129 lb (58.5 kg)   BMI 21.47 kg/m   Assessment and Plan: 1. Tobacco dependence Discussed low dose chest ct/patient apparently no show. Weight loss discussed. Will rediscuss with Dr B  2. Hypertension, essential, benign Chronic Controlled Continue lisinopril hctz daily check renal panel - lisinopril-hydrochlorothiazide (PRINZIDE,ZESTORETIC) 20-12.5 MG tablet; Take 1 tablet by mouth daily.  Dispense: 90 tablet; Refill: 1 - Renal Function Panel  3. Prediabetes Reviewed previous  A1C. Recheck today/ Patient permitted limited foot exam. - Hemoglobin A1c - Renal Function Panel  4. RLS (restless legs syndrome) Controlled with gabapentin. Refilled 6 months. - gabapentin (NEURONTIN) 100 MG capsule; Take 1 capsule (100 mg total) by mouth at bedtime.  Dispense: 90 capsule; Refill: 1     Dr. [facility]  09/17/2018

## 2018-09-18 DIAGNOSIS — K259 Gastric ulcer, unspecified as acute or chronic, without hemorrhage or perforation: Secondary | ICD-10-CM | POA: Diagnosis not present

## 2018-09-18 DIAGNOSIS — D500 Iron deficiency anemia secondary to blood loss (chronic): Secondary | ICD-10-CM | POA: Diagnosis not present

## 2018-09-18 LAB — RENAL FUNCTION PANEL
Albumin: 4.6 g/dL (ref 3.5–4.8)
BUN / CREAT RATIO: 14 (ref 12–28)
BUN: 10 mg/dL (ref 8–27)
CALCIUM: 9.7 mg/dL (ref 8.7–10.3)
CHLORIDE: 99 mmol/L (ref 96–106)
CO2: 22 mmol/L (ref 20–29)
Creatinine, Ser: 0.7 mg/dL (ref 0.57–1.00)
GFR calc non Af Amer: 85 mL/min/{1.73_m2} (ref >59)
GFR, EST AFRICAN AMERICAN: 98 mL/min/{1.73_m2} (ref >59)
GLUCOSE: 94 mg/dL (ref 65–99)
POTASSIUM: 4.3 mmol/L (ref 3.5–5.2)
Phosphorus: 4.4 mg/dL (ref 2.5–4.5)
Sodium: 138 mmol/L (ref 134–144)

## 2018-09-18 LAB — HEMOGLOBIN A1C
Est. average glucose Bld gHb Est-mCnc: 120 mg/dL
HEMOGLOBIN A1C: 5.8 % — AB (ref 4.8–5.6)

## 2018-09-20 ENCOUNTER — Other Ambulatory Visit: Payer: Self-pay | Admitting: *Deleted

## 2018-09-20 DIAGNOSIS — D500 Iron deficiency anemia secondary to blood loss (chronic): Secondary | ICD-10-CM

## 2018-09-26 ENCOUNTER — Inpatient Hospital Stay: Payer: Medicare Other | Attending: Internal Medicine

## 2018-09-30 ENCOUNTER — Inpatient Hospital Stay: Payer: Medicare Other | Attending: Internal Medicine

## 2018-09-30 DIAGNOSIS — F17210 Nicotine dependence, cigarettes, uncomplicated: Secondary | ICD-10-CM | POA: Insufficient documentation

## 2018-09-30 DIAGNOSIS — E119 Type 2 diabetes mellitus without complications: Secondary | ICD-10-CM | POA: Insufficient documentation

## 2018-09-30 DIAGNOSIS — Z79899 Other long term (current) drug therapy: Secondary | ICD-10-CM | POA: Diagnosis not present

## 2018-09-30 DIAGNOSIS — J449 Chronic obstructive pulmonary disease, unspecified: Secondary | ICD-10-CM | POA: Insufficient documentation

## 2018-09-30 DIAGNOSIS — D500 Iron deficiency anemia secondary to blood loss (chronic): Secondary | ICD-10-CM | POA: Insufficient documentation

## 2018-09-30 DIAGNOSIS — I10 Essential (primary) hypertension: Secondary | ICD-10-CM | POA: Insufficient documentation

## 2018-09-30 LAB — CBC WITH DIFFERENTIAL/PLATELET
ABS IMMATURE GRANULOCYTES: 0.02 10*3/uL (ref 0.00–0.07)
BASOS ABS: 0.1 10*3/uL (ref 0.0–0.1)
BASOS PCT: 1 %
EOS ABS: 0.2 10*3/uL (ref 0.0–0.5)
Eosinophils Relative: 2 %
HCT: 33.8 % — ABNORMAL LOW (ref 36.0–46.0)
Hemoglobin: 10.4 g/dL — ABNORMAL LOW (ref 12.0–15.0)
IMMATURE GRANULOCYTES: 0 %
LYMPHS ABS: 2.3 10*3/uL (ref 0.7–4.0)
Lymphocytes Relative: 24 %
MCH: 24.2 pg — ABNORMAL LOW (ref 26.0–34.0)
MCHC: 30.8 g/dL (ref 30.0–36.0)
MCV: 78.8 fL — ABNORMAL LOW (ref 80.0–100.0)
Monocytes Absolute: 0.9 10*3/uL (ref 0.1–1.0)
Monocytes Relative: 9 %
NEUTROS PCT: 64 %
NRBC: 0 % (ref 0.0–0.2)
Neutro Abs: 6.1 10*3/uL (ref 1.7–7.7)
PLATELETS: 451 10*3/uL — AB (ref 150–400)
RBC: 4.29 MIL/uL (ref 3.87–5.11)
RDW: 18.8 % — AB (ref 11.5–15.5)
WBC: 9.6 10*3/uL (ref 4.0–10.5)

## 2018-09-30 LAB — BASIC METABOLIC PANEL
Anion gap: 9 (ref 5–15)
BUN: 13 mg/dL (ref 8–23)
CALCIUM: 9.2 mg/dL (ref 8.9–10.3)
CO2: 28 mmol/L (ref 22–32)
Chloride: 99 mmol/L (ref 98–111)
Creatinine, Ser: 0.61 mg/dL (ref 0.44–1.00)
Glucose, Bld: 114 mg/dL — ABNORMAL HIGH (ref 70–99)
Potassium: 3.9 mmol/L (ref 3.5–5.1)
Sodium: 136 mmol/L (ref 135–145)

## 2018-09-30 LAB — IRON AND TIBC
IRON: 15 ug/dL — AB (ref 28–170)
Saturation Ratios: 4 % — ABNORMAL LOW (ref 10.4–31.8)
TIBC: 429 ug/dL (ref 250–450)
UIBC: 414 ug/dL

## 2018-09-30 LAB — FERRITIN: Ferritin: 13 ng/mL (ref 11–307)

## 2018-10-01 ENCOUNTER — Other Ambulatory Visit: Payer: Self-pay

## 2018-10-01 ENCOUNTER — Encounter: Payer: Self-pay | Admitting: Internal Medicine

## 2018-10-01 ENCOUNTER — Inpatient Hospital Stay: Payer: Medicare Other | Admitting: Internal Medicine

## 2018-10-01 VITALS — BP 95/68 | HR 74 | Temp 97.6°F | Resp 18 | Ht 65.0 in | Wt 130.1 lb

## 2018-10-01 DIAGNOSIS — I10 Essential (primary) hypertension: Secondary | ICD-10-CM | POA: Diagnosis not present

## 2018-10-01 DIAGNOSIS — J449 Chronic obstructive pulmonary disease, unspecified: Secondary | ICD-10-CM

## 2018-10-01 DIAGNOSIS — E119 Type 2 diabetes mellitus without complications: Secondary | ICD-10-CM | POA: Diagnosis not present

## 2018-10-01 DIAGNOSIS — F17210 Nicotine dependence, cigarettes, uncomplicated: Secondary | ICD-10-CM | POA: Diagnosis not present

## 2018-10-01 DIAGNOSIS — D500 Iron deficiency anemia secondary to blood loss (chronic): Secondary | ICD-10-CM

## 2018-10-01 DIAGNOSIS — Z79899 Other long term (current) drug therapy: Secondary | ICD-10-CM | POA: Diagnosis not present

## 2018-10-01 NOTE — Progress Notes (Signed)
[facility] NOTE  Patient Care Team: Juline Patch, MD as PCP - General (Family Medicine) Ok Edwards, NP as Nurse Practitioner (Gastroenterology)  CHIEF COMPLAINTS/PURPOSE OF CONSULTATION: Iron deficient anemia  #Iron deficiency anemia-upper GI bleed/gastric ulcer [April 2019]  #April 2019-gastric ulcer/likely NSAID related [status post EGD/colonoscopy]  EGD and colonoscopy 12/07/17:EGD- multiple gastric ulcers, reflux esophagitis, and a normal duodenum.Colonoscopy- 3 hyperplastic polyps; repeat EGD 4/19- gastric ulcers, some duodenal avms, and reflux esophagitis with some possible Barretts changes. VCE 4/19 unremarkable   No history exists.     HISTORY OF PRESENTING ILLNESS:  Angelica Walker 77 y.o.  female history of iron deficient anemia secondary to gastric ulcer/GI bleed here for follow-up.  Patient is on p.o. Iron.  Patient complains of intermittent abdominal pain.  Complains of bloating.  Complains of nausea.  Intermittent.  Review of Systems  Constitutional: Negative for chills, diaphoresis, fever, malaise/fatigue and weight loss.  HENT: Negative for nosebleeds and sore throat.   Eyes: Negative for double vision.  Respiratory: Negative for cough, hemoptysis, sputum production, shortness of breath and wheezing.   Cardiovascular: Negative for chest pain, palpitations, orthopnea and leg swelling.  Gastrointestinal: Positive for abdominal pain and nausea. Negative for blood in stool, constipation, diarrhea, heartburn, melena and vomiting.  Genitourinary: Negative for dysuria, frequency and urgency.  Musculoskeletal: Negative for back pain and joint pain.  Skin: Negative.  Negative for itching and rash.  Neurological: Negative for dizziness, tingling, focal weakness, weakness and headaches.  Endo/Heme/Allergies: Does not bruise/bleed easily.  Psychiatric/Behavioral: Negative for depression. The patient is not nervous/anxious and does not  have insomnia.      MEDICAL HISTORY:  Past Medical History:  Diagnosis Date  . COPD (chronic obstructive pulmonary disease) ([place])   . Diabetes mellitus without complication ([place])   . GERD (gastroesophageal reflux disease)   . Hyperlipidemia   . Hypertension   . Serum lipids high     SURGICAL HISTORY: Past Surgical History:  Procedure Laterality Date  . COLONOSCOPY WITH PROPOFOL N/A 12/07/2017   Procedure: COLONOSCOPY WITH PROPOFOL;  Surgeon: Lollie Sails, MD;  Location: [facility] ENDOSCOPY;  Service: Endoscopy;  Laterality: N/A;  . ESOPHAGOGASTRODUODENOSCOPY (EGD) WITH PROPOFOL N/A 12/07/2017   Procedure: ESOPHAGOGASTRODUODENOSCOPY (EGD) WITH PROPOFOL;  Surgeon: Lollie Sails, MD;  Location: [facility] ENDOSCOPY;  Service: Endoscopy;  Laterality: N/A;  . ESOPHAGOGASTRODUODENOSCOPY (EGD) WITH PROPOFOL N/A 03/21/2018   Procedure: ESOPHAGOGASTRODUODENOSCOPY (EGD) WITH PROPOFOL;  Surgeon: Lollie Sails, MD;  Location[facility] ENDOSCOPY;  Service: Endoscopy;  Laterality: N/A;  . ESOPHAGOGASTRODUODENOSCOPY (EGD) WITH PROPOFOL N/A 07/22/2018   Procedure: ESOPHAGOGASTRODUODENOSCOPY (EGD) WITH PROPOFOL;  Surgeon: Lollie Sails, MD;  Location: [facility] ENDOSCOPY;  Service: Endoscopy;  Laterality: N/A;    SOCIAL HISTORY: Social History   Socioeconomic History  . Marital status: Divorced    Spouse name: Not on file  . Number of children: 1  . Years of education: some college  . Highest education level: 12th grade  Occupational History  . Occupation: Retired  Scientific laboratory technician  . Financial resource strain: Not hard at all  . Food insecurity:    Worry: Never true    Inability: Never true  . Transportation needs:    Medical: No    Non-medical: No  Tobacco Use  . Smoking status: Current Every Day Smoker    Packs/day: 1.00    Years: 54.00    Pack years: 54.00    Types: Cigarettes  . Smokeless tobacco: Never Used  . Tobacco  comment: patches and pills discussed  Substance and Sexual  Activity  . Alcohol use: No    Frequency: Never  . Drug use: No  . Sexual activity: Not Currently  Lifestyle  . Physical activity:    Days per week: 0 days    Minutes per session: 0 min  . Stress: Not at all  Relationships  . Social connections:    Talks on phone: Patient refused    Gets together: Patient refused    Attends religious service: Patient refused    Active member of club or organization: Patient refused    Attends meetings of clubs or organizations: Patient refused    Relationship status: Patient refused  . Intimate partner violence:    Fear of current or ex partner: No    Emotionally abused: No    Physically abused: No    Forced sexual activity: No  Other Topics Concern  . Not on file  Social History Narrative  . Not on file    FAMILY HISTORY: Family History  Problem Relation Age of Onset  . Breast cancer Maternal Aunt     ALLERGIES:  is allergic to simvastatin.  MEDICATIONS:  Current Outpatient Medications  Medication Sig Dispense Refill  . acetaminophen (TYLENOL) 500 MG tablet Take 1,000 mg by mouth 2 (two) times daily as needed.    . Ascorbic Acid (VITAMIN C) 100 MG tablet Take 100 mg by mouth daily.    . Calcium 600-200 MG-UNIT tablet Take 1 tablet by mouth daily.     . Cyanocobalamin (B-12) 1000 MCG TABS Take 1 tablet by mouth daily. 30 tablet   . ferrous sulfate 325 (65 FE) MG tablet Take 1 tablet (325 mg total) by mouth daily.  3  . gabapentin (NEURONTIN) 100 MG capsule Take 1 capsule (100 mg total) by mouth at bedtime. 90 capsule 1  . lisinopril-hydrochlorothiazide (PRINZIDE,ZESTORETIC) 20-12.5 MG tablet Take 1 tablet by mouth daily. 90 tablet 1  . pantoprazole (PROTONIX) 40 MG tablet Take 1 tablet by mouth daily. [place]    . sucralfate (CARAFATE) 1 g tablet Take 1 g by mouth 2 (two) times daily.      No current facility-administered medications for this visit.       Marland Kitchen  PHYSICAL EXAMINATION: ECOG PERFORMANCE STATUS: 1 - Symptomatic but  completely ambulatory  Vitals:   10/01/18 1345  BP: 95/68  Pulse: 74  Resp: 18  Temp: 97.6 F (36.4 C)   Filed Weights   10/01/18 1402  Weight: 130 lb 1.1 oz (59 kg)    Physical Exam  Constitutional: She is oriented to person, place, and time and well-developed, well-nourished, and in no distress.  HENT:  Head: Normocephalic and atraumatic.  Mouth/Throat: Oropharynx is clear and moist. No oropharyngeal exudate.  Eyes: Pupils are equal, round, and reactive to light.  Neck: Normal range of motion. Neck supple.  Cardiovascular: Normal rate and regular rhythm.  Pulmonary/Chest: No respiratory distress. She has no wheezes.  Abdominal: Soft. Bowel sounds are normal. She exhibits no distension and no mass. There is no tenderness. There is no rebound and no guarding.  Musculoskeletal: Normal range of motion. She exhibits no edema or tenderness.  Neurological: She is alert and oriented to person, place, and time.  Skin: Skin is warm.  Psychiatric: Affect normal.     LABORATORY DATA:  I have reviewed the data as listed Lab Results  Component Value Date   WBC 9.6 09/30/2018   HGB 10.4 (L) 09/30/2018   HCT  33.8 (L) 09/30/2018   MCV 78.8 (L) 09/30/2018   PLT 451 (H) 09/30/2018   Recent Labs    12/19/17 1621 09/17/18 1130 09/30/18 1049  NA 136 138 136  K 3.9 4.3 3.9  CL 97 99 99  CO2 24 22 28   GLUCOSE 111* 94 114*  BUN 6* 10 13  CREATININE 0.67 0.70 0.61  CALCIUM 9.4 9.7 9.2  GFRNONAA 87 85 >60  GFRAA 100 98 >60  PROT 7.1  --   --   ALBUMIN 4.3 4.6  --   AST 18  --   --   ALT 8  --   --   ALKPHOS 87  --   --   BILITOT 0.4  --   --     RADIOGRAPHIC STUDIES: I have personally reviewed the radiological images as listed and agreed with the findings in the report. No results found.  ASSESSMENT & PLAN:   Iron deficiency anemia due to chronic blood loss # Iron deficient anemia hemoglobin-likely secondary upper GI bleed.  Hemoglobin improving around 11.  Patient p.o.  iron.  Tolerating poorly.  #Proceed with Venofer weekly x 4; discussed again the potential infusion reactions.  Recommend holding p.o. iron.  # # IV venofer weekly x 4 in 1 week.   # follow up in 3 months/cbc/possible venofer-Dr.B    All questions were answered. The patient knows to call the clinic with any problems, questions or concerns.    Cammie Sickle, MD 10/01/2018 9:59 PM

## 2018-10-01 NOTE — Assessment & Plan Note (Addendum)
#   Iron deficient anemia hemoglobin-likely secondary upper GI bleed.  Hemoglobin improving around 10.4 saturation 4%.  Patient p.o. iron.  Tolerating poorly.  #Proceed with Venofer weekly x 4; discussed again the potential infusion reactions.  Recommend holding p.o. iron.  # # IV venofer weekly x 4 in 1 week.   # follow up in 3 months/cbc/possible venofer-Dr.B

## 2018-10-08 ENCOUNTER — Inpatient Hospital Stay: Payer: Medicare Other

## 2018-10-08 VITALS — BP 132/67 | HR 80 | Temp 97.6°F | Resp 18

## 2018-10-08 DIAGNOSIS — E119 Type 2 diabetes mellitus without complications: Secondary | ICD-10-CM | POA: Diagnosis not present

## 2018-10-08 DIAGNOSIS — Z79899 Other long term (current) drug therapy: Secondary | ICD-10-CM | POA: Diagnosis not present

## 2018-10-08 DIAGNOSIS — J449 Chronic obstructive pulmonary disease, unspecified: Secondary | ICD-10-CM | POA: Diagnosis not present

## 2018-10-08 DIAGNOSIS — D500 Iron deficiency anemia secondary to blood loss (chronic): Secondary | ICD-10-CM | POA: Diagnosis not present

## 2018-10-08 DIAGNOSIS — I10 Essential (primary) hypertension: Secondary | ICD-10-CM | POA: Diagnosis not present

## 2018-10-08 MED ORDER — SODIUM CHLORIDE 0.9 % IV SOLN: 200.0000 mg | Freq: Once | INTRAVENOUS | Status: DC

## 2018-10-08 MED ORDER — IRON SUCROSE 20 MG/ML IV SOLN
200.0000 mg | Freq: Once | INTRAVENOUS | Status: AC
  Administered 2018-10-08: 200 mg via INTRAVENOUS

## 2018-10-08 MED ORDER — SODIUM CHLORIDE 0.9 % IV SOLN
Freq: Once | INTRAVENOUS | Status: AC
  Administered 2018-10-08: 14:00:00 via INTRAVENOUS
  Filled 2018-10-08: qty 250

## 2018-10-08 NOTE — Patient Instructions (Signed)
Iron Sucrose injection What is this medicine? IRON SUCROSE (AHY ern SOO krohs) is an iron complex. Iron is used to make healthy red blood cells, which carry oxygen and nutrients throughout the body. This medicine is used to treat iron deficiency anemia in people with chronic kidney disease. This medicine may be used for other purposes; ask your health care provider or pharmacist if you have questions. COMMON BRAND NAME(S): Venofer What should I tell my health care provider before I take this medicine? They need to know if you have any of these conditions: -anemia not caused by low iron levels -heart disease -high levels of iron in the blood -kidney disease -liver disease -an unusual or allergic reaction to iron, other medicines, foods, dyes, or preservatives -pregnant or trying to get pregnant -breast-feeding How should I use this medicine? This medicine is for infusion into a vein. It is given by a health care professional in a hospital or clinic setting. Talk to your pediatrician regarding the use of this medicine in children. While this drug may be prescribed for children as young as 2 years for selected conditions, precautions do apply. Overdosage: If you think you have taken too much of this medicine contact a poison control center or emergency room at once. NOTE: This medicine is only for you. Do not share this medicine with others. What if I miss a dose? It is important not to miss your dose. Call your doctor or health care professional if you are unable to keep an appointment. What may interact with this medicine? Do not take this medicine with any of the following medications: -deferoxamine -dimercaprol -other iron products This medicine may also interact with the following medications: -chloramphenicol -deferasirox This list may not describe all possible interactions. Give your health care provider a list of all the medicines, herbs, non-prescription drugs, or dietary  supplements you use. Also tell them if you smoke, drink alcohol, or use illegal drugs. Some items may interact with your medicine. What should I watch for while using this medicine? Visit your doctor or healthcare professional regularly. Tell your doctor or healthcare professional if your symptoms do not start to get better or if they get worse. You may need blood work done while you are taking this medicine. You may need to follow a special diet. Talk to your doctor. Foods that contain iron include: whole grains/cereals, dried fruits, beans, or peas, leafy green vegetables, and organ meats (liver, kidney). What side effects may I notice from receiving this medicine? Side effects that you should report to your doctor or health care professional as soon as possible: -allergic reactions like skin rash, itching or hives, swelling of the face, lips, or tongue -breathing problems -changes in blood pressure -cough -fast, irregular heartbeat -feeling faint or lightheaded, falls -fever or chills -flushing, sweating, or hot feelings -joint or muscle aches/pains -seizures -swelling of the ankles or feet -unusually weak or tired Side effects that usually do not require medical attention (report to your doctor or health care professional if they continue or are bothersome): -diarrhea -feeling achy -headache -irritation at site where injected -nausea, vomiting -stomach upset -tiredness This list may not describe all possible side effects. Call your doctor for medical advice about side effects. You may report side effects to FDA at 1-800-FDA-1088. Where should I keep my medicine? This drug is given in a hospital or clinic and will not be stored at home. NOTE: This sheet is a summary. It may not cover all possible information. If   you have questions about this medicine, talk to your doctor, pharmacist, or health care provider.  2018 Elsevier/Gold Standard (2011-09-14 17:14:35)  

## 2018-10-15 ENCOUNTER — Inpatient Hospital Stay: Payer: Medicare Other

## 2018-10-15 VITALS — BP 169/70 | HR 77 | Temp 97.2°F | Resp 18

## 2018-10-15 DIAGNOSIS — E119 Type 2 diabetes mellitus without complications: Secondary | ICD-10-CM | POA: Diagnosis not present

## 2018-10-15 DIAGNOSIS — D500 Iron deficiency anemia secondary to blood loss (chronic): Secondary | ICD-10-CM | POA: Diagnosis not present

## 2018-10-15 DIAGNOSIS — J449 Chronic obstructive pulmonary disease, unspecified: Secondary | ICD-10-CM | POA: Diagnosis not present

## 2018-10-15 DIAGNOSIS — I10 Essential (primary) hypertension: Secondary | ICD-10-CM | POA: Diagnosis not present

## 2018-10-15 DIAGNOSIS — Z79899 Other long term (current) drug therapy: Secondary | ICD-10-CM | POA: Diagnosis not present

## 2018-10-15 MED ORDER — SODIUM CHLORIDE 0.9 % IV SOLN: 200.0000 mg | Freq: Once | INTRAVENOUS | Status: DC

## 2018-10-15 MED ORDER — IRON SUCROSE 20 MG/ML IV SOLN
200.0000 mg | Freq: Once | INTRAVENOUS | Status: AC
  Administered 2018-10-15: 200 mg via INTRAVENOUS
  Filled 2018-10-15: qty 10

## 2018-10-15 MED ORDER — SODIUM CHLORIDE 0.9 % IV SOLN
Freq: Once | INTRAVENOUS | Status: AC
  Administered 2018-10-15: 14:00:00 via INTRAVENOUS
  Filled 2018-10-15: qty 250

## 2018-10-15 NOTE — Patient Instructions (Signed)
Iron Sucrose injection What is this medicine? IRON SUCROSE (AHY ern SOO krohs) is an iron complex. Iron is used to make healthy red blood cells, which carry oxygen and nutrients throughout the body. This medicine is used to treat iron deficiency anemia in people with chronic kidney disease. This medicine may be used for other purposes; ask your health care provider or pharmacist if you have questions. COMMON BRAND NAME(S): Venofer What should I tell my health care provider before I take this medicine? They need to know if you have any of these conditions: -anemia not caused by low iron levels -heart disease -high levels of iron in the blood -kidney disease -liver disease -an unusual or allergic reaction to iron, other medicines, foods, dyes, or preservatives -pregnant or trying to get pregnant -breast-feeding How should I use this medicine? This medicine is for infusion into a vein. It is given by a health care professional in a hospital or clinic setting. Talk to your pediatrician regarding the use of this medicine in children. While this drug may be prescribed for children as young as 2 years for selected conditions, precautions do apply. Overdosage: If you think you have taken too much of this medicine contact a poison control center or emergency room at once. NOTE: This medicine is only for you. Do not share this medicine with others. What if I miss a dose? It is important not to miss your dose. Call your doctor or health care professional if you are unable to keep an appointment. What may interact with this medicine? Do not take this medicine with any of the following medications: -deferoxamine -dimercaprol -other iron products This medicine may also interact with the following medications: -chloramphenicol -deferasirox This list may not describe all possible interactions. Give your health care provider a list of all the medicines, herbs, non-prescription drugs, or dietary  supplements you use. Also tell them if you smoke, drink alcohol, or use illegal drugs. Some items may interact with your medicine. What should I watch for while using this medicine? Visit your doctor or healthcare professional regularly. Tell your doctor or healthcare professional if your symptoms do not start to get better or if they get worse. You may need blood work done while you are taking this medicine. You may need to follow a special diet. Talk to your doctor. Foods that contain iron include: whole grains/cereals, dried fruits, beans, or peas, leafy green vegetables, and organ meats (liver, kidney). What side effects may I notice from receiving this medicine? Side effects that you should report to your doctor or health care professional as soon as possible: -allergic reactions like skin rash, itching or hives, swelling of the face, lips, or tongue -breathing problems -changes in blood pressure -cough -fast, irregular heartbeat -feeling faint or lightheaded, falls -fever or chills -flushing, sweating, or hot feelings -joint or muscle aches/pains -seizures -swelling of the ankles or feet -unusually weak or tired Side effects that usually do not require medical attention (report to your doctor or health care professional if they continue or are bothersome): -diarrhea -feeling achy -headache -irritation at site where injected -nausea, vomiting -stomach upset -tiredness This list may not describe all possible side effects. Call your doctor for medical advice about side effects. You may report side effects to FDA at 1-800-FDA-1088. Where should I keep my medicine? This drug is given in a hospital or clinic and will not be stored at home. NOTE: This sheet is a summary. It may not cover all possible information. If   you have questions about this medicine, talk to your doctor, pharmacist, or health care provider.  2018 Elsevier/Gold Standard (2011-09-14 17:14:35)  

## 2018-10-22 ENCOUNTER — Inpatient Hospital Stay: Payer: Medicare Other | Attending: Internal Medicine

## 2018-10-22 VITALS — BP 136/70 | HR 74 | Temp 96.8°F | Resp 18

## 2018-10-22 DIAGNOSIS — F17210 Nicotine dependence, cigarettes, uncomplicated: Secondary | ICD-10-CM | POA: Insufficient documentation

## 2018-10-22 DIAGNOSIS — D500 Iron deficiency anemia secondary to blood loss (chronic): Secondary | ICD-10-CM

## 2018-10-22 MED ORDER — IRON SUCROSE 20 MG/ML IV SOLN
200.0000 mg | Freq: Once | INTRAVENOUS | Status: AC
  Administered 2018-10-22: 200 mg via INTRAVENOUS
  Filled 2018-10-22: qty 10

## 2018-10-22 MED ORDER — SODIUM CHLORIDE 0.9 % IV SOLN: 200.0000 mg | Freq: Once | INTRAVENOUS | Status: DC

## 2018-10-22 MED ORDER — SODIUM CHLORIDE 0.9 % IV SOLN
Freq: Once | INTRAVENOUS | Status: AC
  Administered 2018-10-22: 14:00:00 via INTRAVENOUS
  Filled 2018-10-22: qty 250

## 2018-10-22 NOTE — Patient Instructions (Signed)
Iron Sucrose injection What is this medicine? IRON SUCROSE (AHY ern SOO krohs) is an iron complex. Iron is used to make healthy red blood cells, which carry oxygen and nutrients throughout the body. This medicine is used to treat iron deficiency anemia in people with chronic kidney disease. This medicine may be used for other purposes; ask your health care provider or pharmacist if you have questions. COMMON BRAND NAME(S): Venofer What should I tell my health care provider before I take this medicine? They need to know if you have any of these conditions: -anemia not caused by low iron levels -heart disease -high levels of iron in the blood -kidney disease -liver disease -an unusual or allergic reaction to iron, other medicines, foods, dyes, or preservatives -pregnant or trying to get pregnant -breast-feeding How should I use this medicine? This medicine is for infusion into a vein. It is given by a health care professional in a hospital or clinic setting. Talk to your pediatrician regarding the use of this medicine in children. While this drug may be prescribed for children as young as 2 years for selected conditions, precautions do apply. Overdosage: If you think you have taken too much of this medicine contact a poison control center or emergency room at once. NOTE: This medicine is only for you. Do not share this medicine with others. What if I miss a dose? It is important not to miss your dose. Call your doctor or health care professional if you are unable to keep an appointment. What may interact with this medicine? Do not take this medicine with any of the following medications: -deferoxamine -dimercaprol -other iron products This medicine may also interact with the following medications: -chloramphenicol -deferasirox This list may not describe all possible interactions. Give your health care provider a list of all the medicines, herbs, non-prescription drugs, or dietary  supplements you use. Also tell them if you smoke, drink alcohol, or use illegal drugs. Some items may interact with your medicine. What should I watch for while using this medicine? Visit your doctor or healthcare professional regularly. Tell your doctor or healthcare professional if your symptoms do not start to get better or if they get worse. You may need blood work done while you are taking this medicine. You may need to follow a special diet. Talk to your doctor. Foods that contain iron include: whole grains/cereals, dried fruits, beans, or peas, leafy green vegetables, and organ meats (liver, kidney). What side effects may I notice from receiving this medicine? Side effects that you should report to your doctor or health care professional as soon as possible: -allergic reactions like skin rash, itching or hives, swelling of the face, lips, or tongue -breathing problems -changes in blood pressure -cough -fast, irregular heartbeat -feeling faint or lightheaded, falls -fever or chills -flushing, sweating, or hot feelings -joint or muscle aches/pains -seizures -swelling of the ankles or feet -unusually weak or tired Side effects that usually do not require medical attention (report to your doctor or health care professional if they continue or are bothersome): -diarrhea -feeling achy -headache -irritation at site where injected -nausea, vomiting -stomach upset -tiredness This list may not describe all possible side effects. Call your doctor for medical advice about side effects. You may report side effects to FDA at 1-800-FDA-1088. Where should I keep my medicine? This drug is given in a hospital or clinic and will not be stored at home. NOTE: This sheet is a summary. It may not cover all possible information. If   you have questions about this medicine, talk to your doctor, pharmacist, or health care provider.  2018 Elsevier/Gold Standard (2011-09-14 17:14:35)  

## 2018-10-29 ENCOUNTER — Inpatient Hospital Stay: Payer: Medicare Other

## 2018-10-29 VITALS — BP 112/68 | HR 70 | Temp 96.8°F | Resp 18

## 2018-10-29 DIAGNOSIS — D500 Iron deficiency anemia secondary to blood loss (chronic): Secondary | ICD-10-CM | POA: Diagnosis not present

## 2018-10-29 MED ORDER — SODIUM CHLORIDE 0.9 % IV SOLN
Freq: Once | INTRAVENOUS | Status: AC
  Administered 2018-10-29: 14:00:00 via INTRAVENOUS
  Filled 2018-10-29: qty 250

## 2018-10-29 MED ORDER — SODIUM CHLORIDE 0.9 % IV SOLN: 200.0000 mg | Freq: Once | INTRAVENOUS | Status: DC

## 2018-10-29 MED ORDER — IRON SUCROSE 20 MG/ML IV SOLN
200.0000 mg | Freq: Once | INTRAVENOUS | Status: AC
  Administered 2018-10-29: 200 mg via INTRAVENOUS
  Filled 2018-10-29: qty 10

## 2018-12-17 ENCOUNTER — Other Ambulatory Visit: Payer: Self-pay | Admitting: *Deleted

## 2018-12-17 DIAGNOSIS — D500 Iron deficiency anemia secondary to blood loss (chronic): Secondary | ICD-10-CM

## 2018-12-24 ENCOUNTER — Encounter: Payer: Self-pay | Admitting: Internal Medicine

## 2018-12-24 ENCOUNTER — Inpatient Hospital Stay: Payer: Medicare Other

## 2018-12-24 ENCOUNTER — Inpatient Hospital Stay: Payer: Medicare Other | Attending: Internal Medicine | Admitting: Internal Medicine

## 2018-12-24 DIAGNOSIS — K922 Gastrointestinal hemorrhage, unspecified: Secondary | ICD-10-CM | POA: Diagnosis not present

## 2018-12-24 DIAGNOSIS — J449 Chronic obstructive pulmonary disease, unspecified: Secondary | ICD-10-CM

## 2018-12-24 DIAGNOSIS — Z79899 Other long term (current) drug therapy: Secondary | ICD-10-CM | POA: Insufficient documentation

## 2018-12-24 DIAGNOSIS — F17210 Nicotine dependence, cigarettes, uncomplicated: Secondary | ICD-10-CM | POA: Diagnosis not present

## 2018-12-24 DIAGNOSIS — K259 Gastric ulcer, unspecified as acute or chronic, without hemorrhage or perforation: Secondary | ICD-10-CM | POA: Diagnosis not present

## 2018-12-24 DIAGNOSIS — I10 Essential (primary) hypertension: Secondary | ICD-10-CM

## 2018-12-24 DIAGNOSIS — D500 Iron deficiency anemia secondary to blood loss (chronic): Secondary | ICD-10-CM

## 2018-12-24 LAB — CBC WITH DIFFERENTIAL/PLATELET
Abs Immature Granulocytes: 0.03 10*3/uL (ref 0.00–0.07)
BASOS ABS: 0.1 10*3/uL (ref 0.0–0.1)
Basophils Relative: 1 %
Eosinophils Absolute: 0.2 10*3/uL (ref 0.0–0.5)
Eosinophils Relative: 2 %
HCT: 38.1 % (ref 36.0–46.0)
HEMOGLOBIN: 12.3 g/dL (ref 12.0–15.0)
Immature Granulocytes: 0 %
LYMPHS PCT: 35 %
Lymphs Abs: 3 10*3/uL (ref 0.7–4.0)
MCH: 27.4 pg (ref 26.0–34.0)
MCHC: 32.3 g/dL (ref 30.0–36.0)
MCV: 84.9 fL (ref 80.0–100.0)
MONO ABS: 0.6 10*3/uL (ref 0.1–1.0)
Monocytes Relative: 7 %
NEUTROS ABS: 4.9 10*3/uL (ref 1.7–7.7)
Neutrophils Relative %: 55 %
Platelets: 397 10*3/uL (ref 150–400)
RBC: 4.49 MIL/uL (ref 3.87–5.11)
RDW: 19 % — ABNORMAL HIGH (ref 11.5–15.5)
WBC: 8.8 10*3/uL (ref 4.0–10.5)
nRBC: 0 % (ref 0.0–0.2)

## 2018-12-24 NOTE — Assessment & Plan Note (Addendum)
#   Iron deficient anemia hemoglobin-likely secondary upper GI bleed.  Status post IV Venofer x4; hemoglobin today is 12.3.  Clinically improved.  Hold off any IV iron at this time.  Patient intolerant to p.o. iron.  #Active smoker- Discussed with the patient regarding the ill effects of smoking- including but not limited to cardiac lung and vascular diseases and malignancies. Counseled against smoking; patient-not interested in quitting.   #Given history of smoking patient is a candidate for lung cancer screening program.  Discussed lung cancer screening program includes low dose noncontrast CT scans on annual basis.  Lung cancer screening program as shown to be effective in diagnosing lung cancer in early stages.  Patient interested.  Lung nurse navigator will contact the patient.   # DISPOSITION: # follow up with Dr.Corcoran/ in 6 months- labs- cbc/bmp/iron studies/ferritin/ possible Venofer-Dr.B  Cc; Shawn/Hayley

## 2018-12-24 NOTE — Progress Notes (Signed)
Angelica Walker NOTE  Patient Care Team: Juline Patch, MD as PCP - General (Family Medicine) Ok Edwards, NP as Nurse Practitioner (Gastroenterology)  CHIEF COMPLAINTS/PURPOSE OF CONSULTATION: Iron deficient anemia  #Iron deficiency anemia-upper GI bleed/gastric ulcer [April 2019]  #April 2019-gastric ulcer/likely NSAID related [status post EGD/colonoscopy]  EGD and colonoscopy 12/07/17:EGD- multiple gastric ulcers, reflux esophagitis, and a normal duodenum.Colonoscopy- 3 hyperplastic polyps; repeat EGD 4/19- gastric ulcers, some duodenal avms, and reflux esophagitis with some possible Barretts changes. VCE 4/19 unremarkable  # smoking [1ppd; since 20 years]   No history exists.     HISTORY OF PRESENTING ILLNESS:  Angelica Walker 78 y.o.  female history of iron deficient anemia secondary to gastric ulcer/GI bleed here for follow-up.   Patient is currently status post IV Venofer weekly x4.  She notes to have improvement of her energy levels.  She continues to have intermittent fatigue.  Otherwise denies any new shortness of breath or cough.  Denies any nausea vomiting or weight loss.  Unfortunately she continues to smoke.  Review of Systems  Constitutional: Positive for malaise/fatigue. Negative for chills, diaphoresis and fever.  HENT: Negative for nosebleeds and sore throat.   Eyes: Negative for double vision.  Respiratory: Negative for cough, hemoptysis, sputum production, shortness of breath and wheezing.   Cardiovascular: Negative for chest pain, palpitations, orthopnea and leg swelling.  Gastrointestinal: Negative for blood in stool, constipation, diarrhea, heartburn, melena and vomiting.  Genitourinary: Negative for dysuria, frequency and urgency.  Musculoskeletal: Negative for back pain and joint pain.  Skin: Negative.  Negative for itching and rash.  Neurological: Negative for dizziness, tingling, focal weakness, weakness and  headaches.  Endo/Heme/Allergies: Does not bruise/bleed easily.  Psychiatric/Behavioral: Negative for depression. The patient is not nervous/anxious and does not have insomnia.      MEDICAL HISTORY:  Past Medical History:  Diagnosis Date  . COPD (chronic obstructive pulmonary disease) ([place])   . Diabetes mellitus without complication ([place])   . GERD (gastroesophageal reflux disease)   . Hyperlipidemia   . Hypertension   . Serum lipids high     SURGICAL HISTORY: Past Surgical History:  Procedure Laterality Date  . COLONOSCOPY WITH PROPOFOL N/A 12/07/2017   Procedure: COLONOSCOPY WITH PROPOFOL;  Surgeon: Lollie Sails, MD;  Location: [facility] ENDOSCOPY;  Service: Endoscopy;  Laterality: N/A;  . ESOPHAGOGASTRODUODENOSCOPY (EGD) WITH PROPOFOL N/A 12/07/2017   Procedure: ESOPHAGOGASTRODUODENOSCOPY (EGD) WITH PROPOFOL;  Surgeon: Lollie Sails, MD;  Location: [facility] ENDOSCOPY;  Service: Endoscopy;  Laterality: N/A;  . ESOPHAGOGASTRODUODENOSCOPY (EGD) WITH PROPOFOL N/A 03/21/2018   Procedure: ESOPHAGOGASTRODUODENOSCOPY (EGD) WITH PROPOFOL;  Surgeon: Lollie Sails, MD;  Location: [facility] ENDOSCOPY;  Service: Endoscopy;  Laterality: N/A;  . ESOPHAGOGASTRODUODENOSCOPY (EGD) WITH PROPOFOL N/A 07/22/2018   Procedure: ESOPHAGOGASTRODUODENOSCOPY (EGD) WITH PROPOFOL;  Surgeon: Lollie Sails, MD;  Location: [facility] ENDOSCOPY;  Service: Endoscopy;  Laterality: N/A;    SOCIAL HISTORY: Social History   Socioeconomic History  . Marital status: Divorced    Spouse name: Not on file  . Number of children: 1  . Years of education: some college  . Highest education level: 12th grade  Occupational History  . Occupation: Retired  Scientific laboratory technician  . Financial resource strain: Not hard at all  . Food insecurity:    Worry: Never true    Inability: Never true  . Transportation needs:    Medical: No    Non-medical: No  Tobacco Use  . Smoking status: Current Every Day Smoker  Packs/day: 1.00     Years: 54.00    Pack years: 54.00    Types: Cigarettes  . Smokeless tobacco: Never Used  . Tobacco comment: patches and pills discussed  Substance and Sexual Activity  . Alcohol use: No    Frequency: Never  . Drug use: No  . Sexual activity: Not Currently  Lifestyle  . Physical activity:    Days per week: 0 days    Minutes per session: 0 min  . Stress: Not at all  Relationships  . Social connections:    Talks on phone: Patient refused    Gets together: Patient refused    Attends religious service: Patient refused    Active member of club or organization: Patient refused    Attends meetings of clubs or organizations: Patient refused    Relationship status: Patient refused  . Intimate partner violence:    Fear of current or ex partner: No    Emotionally abused: No    Physically abused: No    Forced sexual activity: No  Other Topics Concern  . Not on file  Social History Narrative  . Not on file    FAMILY HISTORY: Family History  Problem Relation Age of Onset  . Breast cancer Maternal Aunt     ALLERGIES:  is allergic to simvastatin.  MEDICATIONS:  Current Outpatient Medications  Medication Sig Dispense Refill  . acetaminophen (TYLENOL) 500 MG tablet Take 1,000 mg by mouth 2 (two) times daily as needed.    . Ascorbic Acid (VITAMIN C) 100 MG tablet Take 100 mg by mouth daily.    . Calcium 600-200 MG-UNIT tablet Take 1 tablet by mouth daily.     . Cyanocobalamin (B-12) 1000 MCG TABS Take 1 tablet by mouth daily. 30 tablet   . ferrous sulfate 325 (65 FE) MG tablet Take 1 tablet (325 mg total) by mouth daily.  3  . gabapentin (NEURONTIN) 100 MG capsule Take 1 capsule (100 mg total) by mouth at bedtime. 90 capsule 1  . lisinopril-hydrochlorothiazide (PRINZIDE,ZESTORETIC) 20-12.5 MG tablet Take 1 tablet by mouth daily. 90 tablet 1  . pantoprazole (PROTONIX) 40 MG tablet Take 1 tablet by mouth daily. [place]    . sucralfate (CARAFATE) 1 g tablet Take 1 g by mouth 2 (two)  times daily.      No current facility-administered medications for this visit.       Marland Kitchen  PHYSICAL EXAMINATION: ECOG PERFORMANCE STATUS: 1 - Symptomatic but completely ambulatory  Vitals:   12/24/18 1336  BP: (!) 84/63  Pulse: 96  Resp: 16  Temp: (!) 97.4 F (36.3 C)   Filed Weights   12/24/18 1336  Weight: 126 lb 12.2 oz (57.5 kg)    Physical Exam  Constitutional: She is oriented to person, place, and time and well-developed, well-nourished, and in no distress.  HENT:  Head: Normocephalic and atraumatic.  Mouth/Throat: Oropharynx is clear and moist. No oropharyngeal exudate.  Eyes: Pupils are equal, round, and reactive to light.  Neck: Normal range of motion. Neck supple.  Cardiovascular: Normal rate and regular rhythm.  Pulmonary/Chest: No respiratory distress. She has no wheezes.  Abdominal: Soft. Bowel sounds are normal. She exhibits no distension and no mass. There is no abdominal tenderness. There is no rebound and no guarding.  Musculoskeletal: Normal range of motion.        General: No tenderness or edema.  Neurological: She is alert and oriented to person, place, and time.  Skin: Skin is warm.  Psychiatric:  Affect normal.     LABORATORY DATA:  I have reviewed the data as listed Lab Results  Component Value Date   WBC 8.8 12/24/2018   HGB 12.3 12/24/2018   HCT 38.1 12/24/2018   MCV 84.9 12/24/2018   PLT 397 12/24/2018   Recent Labs    09/17/18 1130 09/30/18 1049  NA 138 136  K 4.3 3.9  CL 99 99  CO2 22 28  GLUCOSE 94 114*  BUN 10 13  CREATININE 0.70 0.61  CALCIUM 9.7 9.2  GFRNONAA 85 >60  GFRAA 98 >60  ALBUMIN 4.6  --     RADIOGRAPHIC STUDIES: I have personally reviewed the radiological images as listed and agreed with the findings in the report. No results found.  ASSESSMENT & PLAN:   Iron deficiency anemia due to chronic blood loss # Iron deficient anemia hemoglobin-likely secondary upper GI bleed.  Status post IV Venofer x4;  hemoglobin today is 12.3.  Clinically improved.  Hold off any IV iron at this time.  Patient intolerant to p.o. iron.  #Active smoker- Discussed with the patient regarding the ill effects of smoking- including but not limited to cardiac lung and vascular diseases and malignancies. Counseled against smoking; patient-not interested in quitting.   #Given history of smoking patient is a candidate for lung cancer screening program.  Discussed lung cancer screening program includes low dose noncontrast CT scans on annual basis.  Lung cancer screening program as shown to be effective in diagnosing lung cancer in early stages.  Patient interested.  Lung nurse navigator will contact the patient.   # DISPOSITION: # follow up with Dr.Corcoran/ in 6 months- labs- cbc/bmp/iron studies/ferritin/ possible Venofer-Dr.B  Cc; Shawn/Hayley    All questions were answered. The patient knows to call the clinic with any problems, questions or concerns.    Cammie Sickle, MD 12/24/2018 2:26 PM

## 2018-12-25 ENCOUNTER — Telehealth: Payer: Self-pay | Admitting: *Deleted

## 2018-12-25 NOTE — Telephone Encounter (Signed)
Received referral for low dose lung cancer screening CT scan.  Message left at phone number listed in EMR for patient to call either myself or Shawn Perkins back at [phone] to facilitate scheduling the scan.    

## 2018-12-26 ENCOUNTER — Telehealth: Payer: Self-pay | Admitting: *Deleted

## 2018-12-26 NOTE — Telephone Encounter (Signed)
Received a referral for initial lung cancer screening scan.  Contacted the patient and obtained their smoking history, current smoker with 55pkyr history smokes 1 ppd  as well as answering questions related to screening process.  Patient denies signs of lung cancer such as weight loss or hemoptysis at this time.  Patient denies comorbidity that would prevent curative treatment if lung cancer were found.  Patient is scheduled for the Shared Decision Making Visit will be scheduled by Burgess Estelle RN as patient is requesting to have the scan in [place].

## 2019-01-01 ENCOUNTER — Telehealth: Payer: Self-pay | Admitting: *Deleted

## 2019-01-01 DIAGNOSIS — Z122 Encounter for screening for malignant neoplasm of respiratory organs: Secondary | ICD-10-CM

## 2019-01-01 DIAGNOSIS — Z87891 Personal history of nicotine dependence: Secondary | ICD-10-CM

## 2019-01-01 NOTE — Telephone Encounter (Signed)
Received referral for initial lung cancer screening scan. Contacted patient and obtained smoking history,(current, 55 pack year) as well as answering questions related to screening process. Patient denies signs of lung cancer such as weight loss or hemoptysis. Patient denies comorbidity that would prevent curative treatment if lung cancer were found. Patient is scheduled for shared decision making visit and CT scan on 01/21/19 at 845am in [place].

## 2019-01-07 ENCOUNTER — Ambulatory Visit: Payer: Medicare Other

## 2019-01-21 ENCOUNTER — Ambulatory Visit
Admission: RE | Admit: 2019-01-21 | Discharge: 2019-01-21 | Disposition: A | Discharge status: Home / Self Care | Payer: Medicare Other | Source: Ambulatory Visit | Attending: Nurse Practitioner | Admitting: Nurse Practitioner

## 2019-01-21 ENCOUNTER — Inpatient Hospital Stay: Payer: Medicare Other | Attending: Oncology | Admitting: Oncology

## 2019-01-21 DIAGNOSIS — Z122 Encounter for screening for malignant neoplasm of respiratory organs: Secondary | ICD-10-CM | POA: Diagnosis not present

## 2019-01-21 DIAGNOSIS — Z87891 Personal history of nicotine dependence: Secondary | ICD-10-CM

## 2019-01-21 NOTE — Progress Notes (Signed)
In accordance with CMS guidelines, patient has met eligibility criteria including age, absence of signs or symptoms of lung cancer.  Social History   Tobacco Use  . Smoking status: Current Every Day Smoker    Packs/day: 1.00    Years: 54.00    Pack years: 54.00    Types: Cigarettes  . Smokeless tobacco: Never Used  . Tobacco comment: patches and pills discussed  Substance Use Topics  . Alcohol use: No    Frequency: Never  . Drug use: No     A shared decision-making session was conducted prior to the performance of CT scan. This includes one or more decision aids, includes benefits and harms of screening, follow-up diagnostic testing, over-diagnosis, false positive rate, and total radiation exposure.  Counseling on the importance of adherence to annual lung cancer LDCT screening, impact of co-morbidities, and ability or willingness to undergo diagnosis and treatment is imperative for compliance of the program.  Counseling on the importance of continued smoking cessation for former smokers; the importance of smoking cessation for current smokers, and information about tobacco cessation interventions have been given to patient including Dawn and [facility] quit [place] programs.  Written order for lung cancer screening with LDCT has been given to the patient and any and all questions have been answered to the best of my abilities.   Yearly follow up will be coordinated by Burgess Estelle, Thoracic Navigator.  Faythe Casa, NP 01/21/2019 1:31 PM

## 2019-01-22 ENCOUNTER — Encounter: Payer: Self-pay | Admitting: *Deleted

## 2019-01-22 DIAGNOSIS — D509 Iron deficiency anemia, unspecified: Secondary | ICD-10-CM | POA: Diagnosis not present

## 2019-01-23 ENCOUNTER — Other Ambulatory Visit: Payer: Self-pay | Admitting: Family Medicine

## 2019-01-23 DIAGNOSIS — Z1231 Encounter for screening mammogram for malignant neoplasm of breast: Secondary | ICD-10-CM

## 2019-01-23 DIAGNOSIS — L821 Other seborrheic keratosis: Secondary | ICD-10-CM | POA: Diagnosis not present

## 2019-01-23 DIAGNOSIS — Z859 Personal history of malignant neoplasm, unspecified: Secondary | ICD-10-CM | POA: Diagnosis not present

## 2019-01-23 DIAGNOSIS — L578 Other skin changes due to chronic exposure to nonionizing radiation: Secondary | ICD-10-CM | POA: Diagnosis not present

## 2019-01-23 DIAGNOSIS — Z1283 Encounter for screening for malignant neoplasm of skin: Secondary | ICD-10-CM | POA: Diagnosis not present

## 2019-01-23 DIAGNOSIS — L570 Actinic keratosis: Secondary | ICD-10-CM | POA: Diagnosis not present

## 2019-01-29 DIAGNOSIS — K259 Gastric ulcer, unspecified as acute or chronic, without hemorrhage or perforation: Secondary | ICD-10-CM | POA: Diagnosis not present

## 2019-01-29 DIAGNOSIS — D500 Iron deficiency anemia secondary to blood loss (chronic): Secondary | ICD-10-CM | POA: Diagnosis not present

## 2019-01-29 DIAGNOSIS — R6881 Early satiety: Secondary | ICD-10-CM | POA: Diagnosis not present

## 2019-02-08 ENCOUNTER — Encounter: Payer: Self-pay | Admitting: *Deleted

## 2019-03-03 ENCOUNTER — Ambulatory Visit: Payer: Medicare Other

## 2019-03-17 ENCOUNTER — Ambulatory Visit: Payer: Medicare Other

## 2019-03-17 ENCOUNTER — Ambulatory Visit: Payer: Self-pay

## 2019-03-19 ENCOUNTER — Ambulatory Visit (INDEPENDENT_AMBULATORY_CARE_PROVIDER_SITE_OTHER): Payer: Medicare Other | Admitting: Family Medicine

## 2019-03-19 ENCOUNTER — Other Ambulatory Visit: Payer: Self-pay

## 2019-03-19 ENCOUNTER — Encounter: Payer: Self-pay | Admitting: Family Medicine

## 2019-03-19 VITALS — BP 124/80 | HR 80 | Ht 65.0 in | Wt 127.0 lb

## 2019-03-19 DIAGNOSIS — I10 Essential (primary) hypertension: Secondary | ICD-10-CM

## 2019-03-19 DIAGNOSIS — G2581 Restless legs syndrome: Secondary | ICD-10-CM

## 2019-03-19 DIAGNOSIS — R7303 Prediabetes: Secondary | ICD-10-CM | POA: Diagnosis not present

## 2019-03-19 DIAGNOSIS — K219 Gastro-esophageal reflux disease without esophagitis: Secondary | ICD-10-CM

## 2019-03-19 MED ORDER — PANTOPRAZOLE SODIUM 40 MG PO TBEC: 40.0000 mg | DELAYED_RELEASE_TABLET | Freq: Every day | ORAL | 1 refills | Status: DC

## 2019-03-19 MED ORDER — GABAPENTIN 100 MG PO CAPS: 100.0000 mg | ORAL_CAPSULE | Freq: Every day | ORAL | 1 refills | Status: DC

## 2019-03-19 MED ORDER — LISINOPRIL-HYDROCHLOROTHIAZIDE 20-12.5 MG PO TABS: 1.0000 | ORAL_TABLET | Freq: Every day | ORAL | 1 refills | Status: DC

## 2019-03-19 NOTE — Progress Notes (Signed)
Date:  03/19/2019   Name:  Angelica Walker   DOB:  [birth year]   MRN:  [id number]   Chief Complaint: Hypertension; Peripheral Neuropathy (refill Gabapentin); and prediabetes (check A1C)  Hypertension  This is a chronic problem. The current episode started more than 1 year ago. The problem is unchanged. The problem is controlled. Pertinent negatives include no anxiety, blurred vision, chest pain, headaches, malaise/fatigue, neck pain, orthopnea, palpitations, peripheral edema, PND, shortness of breath or sweats. There are no associated agents to hypertension. There are no known risk factors for coronary artery disease. Past treatments include ACE inhibitors and diuretics. The current treatment provides moderate improvement. There are no compliance problems.  There is no history of angina, kidney disease, CAD/MI, CVA, heart failure, left ventricular hypertrophy, PVD or retinopathy. There is no history of chronic renal disease, a hypertension causing med or renovascular disease.  Neurologic Problem  The patient's pertinent negatives include no altered mental status, clumsiness, focal sensory loss, focal weakness, loss of balance, memory loss, near-syncope, slurred speech, syncope, visual change or weakness. Primary symptoms comment: neuropathy. This is a new problem. The neurological problem developed gradually. The problem has been waxing and waning since onset. Associated symptoms include back pain. Pertinent negatives include no abdominal pain, chest pain, confusion, dizziness, fatigue, fever, headaches, light-headedness, nausea, neck pain, palpitations, shortness of breath or vomiting. Past treatments include medication (gabapentin). The treatment provided moderate relief. There is no history of mood changes.  Gastroesophageal Reflux  She complains of heartburn. She reports no abdominal pain, no belching, no chest pain, no choking, no coughing, no dysphagia, no early satiety, no globus sensation, no  hoarse voice, no nausea, no sore throat, no stridor, no tooth decay, no water brash or no wheezing. neuropathy. The problem has been gradually improving. The heartburn is of mild intensity. The symptoms are aggravated by certain foods. Pertinent negatives include no fatigue or weight loss.  Diabetes  She presents for her follow-up diabetic visit. She has type 2 diabetes mellitus. Her disease course has been stable. Pertinent negatives for hypoglycemia include no confusion, dizziness, headaches, mood changes, nervousness/anxiousness, pallor, seizures, sleepiness, speech difficulty or sweats. Pertinent negatives for diabetes include no blurred vision, no chest pain, no fatigue, no foot ulcerations, no polydipsia, no polyphagia, no polyuria, no visual change, no weakness and no weight loss. There are no hypoglycemic complications. Symptoms are stable. Pertinent negatives for diabetic complications include no CVA, PVD or retinopathy. Risk factors for coronary artery disease include diabetes mellitus, hypertension and post-menopausal. Current diabetic treatment includes diet.    Review of Systems  Constitutional: Negative.  Negative for chills, fatigue, fever, malaise/fatigue, unexpected weight change and weight loss.  HENT: Negative for congestion, ear discharge, ear pain, hoarse voice, rhinorrhea, sinus pressure, sneezing and sore throat.   Eyes: Negative for blurred vision, photophobia, pain, discharge, redness and itching.  Respiratory: Negative for cough, choking, shortness of breath, wheezing and stridor.   Cardiovascular: Negative for chest pain, palpitations, orthopnea, PND and near-syncope.  Gastrointestinal: Positive for heartburn. Negative for abdominal pain, blood in stool, constipation, diarrhea, dysphagia, nausea and vomiting.  Endocrine: Negative for cold intolerance, heat intolerance, polydipsia, polyphagia and polyuria.  Genitourinary: Negative for dysuria, flank pain, frequency, hematuria,  menstrual problem, pelvic pain, urgency, vaginal bleeding and vaginal discharge.  Musculoskeletal: Positive for back pain. Negative for arthralgias, myalgias and neck pain.  Skin: Negative for pallor and rash.  Allergic/Immunologic: Negative for environmental allergies and food allergies.  Neurological: Negative for dizziness, focal  weakness, seizures, syncope, speech difficulty, weakness, light-headedness, numbness, headaches and loss of balance.  Hematological: Negative for adenopathy. Does not bruise/bleed easily.  Psychiatric/Behavioral: Negative for confusion, dysphoric mood and memory loss. The patient is not nervous/anxious.     Patient Active Problem List   Diagnosis Date Noted  . Iron deficiency anemia due to chronic blood loss 07/02/2018  . Smoker 03/18/2018  . RLS (restless legs syndrome) 01/18/2018  . Anemia due to blood loss 12/20/2017  . Prediabetes 12/19/2017  . Hyperlipidemia, unspecified 12/19/2017  . Hypertension, essential, benign 12/19/2017  . B12 deficiency 12/19/2017  . DDD (degenerative disc disease), lumbar 12/19/2017  . Multiple gastric ulcers 12/19/2017  . COPD (chronic obstructive pulmonary disease) ([place]) 12/21/2014    Allergies  Allergen Reactions  . Simvastatin Other (See Comments)    Past Surgical History:  Procedure Laterality Date  . COLONOSCOPY WITH PROPOFOL N/A 12/07/2017   Procedure: COLONOSCOPY WITH PROPOFOL;  Surgeon: Lollie Sails, MD;  Location: [facility] ENDOSCOPY;  Service: Endoscopy;  Laterality: N/A;  . ESOPHAGOGASTRODUODENOSCOPY (EGD) WITH PROPOFOL N/A 12/07/2017   Procedure: ESOPHAGOGASTRODUODENOSCOPY (EGD) WITH PROPOFOL;  Surgeon: Lollie Sails, MD;  Location: [facility] ENDOSCOPY;  Service: Endoscopy;  Laterality: N/A;  . ESOPHAGOGASTRODUODENOSCOPY (EGD) WITH PROPOFOL N/A 03/21/2018   Procedure: ESOPHAGOGASTRODUODENOSCOPY (EGD) WITH PROPOFOL;  Surgeon: Lollie Sails, MD;  Location: [facility] ENDOSCOPY;  Service: Endoscopy;  Laterality:  N/A;  . ESOPHAGOGASTRODUODENOSCOPY (EGD) WITH PROPOFOL N/A 07/22/2018   Procedure: ESOPHAGOGASTRODUODENOSCOPY (EGD) WITH PROPOFOL;  Surgeon: Lollie Sails, MD;  Location: [facility] ENDOSCOPY;  Service: Endoscopy;  Laterality: N/A;    Social History   Tobacco Use  . Smoking status: Current Every Day Smoker    Packs/day: 1.00    Years: 54.00    Pack years: 54.00    Types: Cigarettes  . Smokeless tobacco: Never Used  . Tobacco comment: patches and pills discussed  Substance Use Topics  . Alcohol use: No    Frequency: Never  . Drug use: No     Medication list has been reviewed and updated.  Current Meds  Medication Sig  . acetaminophen (TYLENOL) 500 MG tablet Take 1,000 mg by mouth 2 (two) times daily as needed.  . Ascorbic Acid (VITAMIN C) 100 MG tablet Take 100 mg by mouth daily.  . Calcium 600-200 MG-UNIT tablet Take 1 tablet by mouth daily.   . Cyanocobalamin (B-12) 1000 MCG TABS Take 1 tablet by mouth daily.  Marland Kitchen gabapentin (NEURONTIN) 100 MG capsule Take 1 capsule (100 mg total) by mouth at bedtime.  Marland Kitchen lisinopril-hydrochlorothiazide (PRINZIDE,ZESTORETIC) 20-12.5 MG tablet Take 1 tablet by mouth daily.  . pantoprazole (PROTONIX) 40 MG tablet Take 1 tablet by mouth daily. [place]  . [DISCONTINUED] ferrous sulfate 325 (65 FE) MG tablet Take 1 tablet (325 mg total) by mouth daily.    PHQ 2/9 Scores 03/19/2019 03/13/2018 12/19/2017  PHQ - 2 Score 0 0 0  PHQ- 9 Score 1 0 -    BP Readings from Last 3 Encounters:  03/19/19 124/80  12/24/18 (!) 84/63  10/29/18 112/68    Physical Exam Vitals signs and nursing note reviewed.  Constitutional:      General: She is not in acute distress.    Appearance: She is not diaphoretic.  HENT:     Head: Normocephalic and atraumatic.     Right Ear: External ear normal.     Left Ear: External ear normal.     Nose: Nose normal. No congestion or rhinorrhea.  Eyes:  General:        Right eye: No discharge.        Left eye: No discharge.      Conjunctiva/sclera: Conjunctivae normal.     Pupils: Pupils are equal, round, and reactive to light.  Neck:     Musculoskeletal: Normal range of motion and neck supple.     Thyroid: No thyromegaly.     Vascular: No JVD.  Cardiovascular:     Rate and Rhythm: Normal rate and regular rhythm.     Heart sounds: Normal heart sounds, S1 normal and S2 normal. No murmur. No systolic murmur. No diastolic murmur. No friction rub. No gallop.   Pulmonary:     Effort: Pulmonary effort is normal. No respiratory distress.     Breath sounds: Normal breath sounds. No stridor. No wheezing, rhonchi or rales.  Abdominal:     General: Bowel sounds are normal.     Palpations: Abdomen is soft. There is no mass.     Tenderness: There is no abdominal tenderness. There is no guarding.  Musculoskeletal: Normal range of motion.  Lymphadenopathy:     Cervical: No cervical adenopathy.  Skin:    General: Skin is warm and dry.  Neurological:     Mental Status: She is alert.     Deep Tendon Reflexes: Reflexes are normal and symmetric.     Wt Readings from Last 3 Encounters:  03/19/19 127 lb (57.6 kg)  12/24/18 126 lb 12.2 oz (57.5 kg)  10/01/18 130 lb 1.1 oz (59 kg)    BP 124/80   Pulse 80   Ht 5\' 5"  (1.651 m)   Wt 127 lb (57.6 kg)   BMI 21.13 kg/m   Assessment and Plan: 1. RLS (restless legs syndrome) And was started on gabapentin by another physician for neuropathy presumably secondary to restless leg syndrome this is improved both of the circumstances however.  We will continue Neurontin 100 mg nightly and also has prediabetes which will be addressed below which may contribute to the discomfort. - gabapentin (NEURONTIN) 100 MG capsule; Take 1 capsule (100 mg total) by mouth at bedtime.  Dispense: 90 capsule; Refill: 1  2. Hypertension, essential, benign Controlled.  Chronic.  Continue lisinopril hydrochlorothiazide 12.5 once a day.  Will check renal function panel to assess GFR. - Renal Function  Panel - lisinopril-hydrochlorothiazide (PRINZIDE,ZESTORETIC) 20-12.5 MG tablet; Take 1 tablet by mouth daily.  Dispense: 90 tablet; Refill: 1  3. Gastroesophageal reflux disease, esophagitis presence not specified Patient has a history of multiple gastric ulcers for which she takes pantoprazole 40 mg once a day.  This will be continued at current dosage.  4. Prediabetes Patient has a history of prediabetes with A1c's in the higher 5 range.  Patient controls this with her diet.  We will check an A1c to assess her current level of control. - HgB A1c

## 2019-03-20 LAB — HEMOGLOBIN A1C
Est. average glucose Bld gHb Est-mCnc: 123 mg/dL
Hgb A1c MFr Bld: 5.9 % — ABNORMAL HIGH (ref 4.8–5.6)

## 2019-03-20 LAB — RENAL FUNCTION PANEL
Albumin: 4.4 g/dL (ref 3.7–4.7)
BUN/Creatinine Ratio: 18 (ref 12–28)
BUN: 13 mg/dL (ref 8–27)
CO2: 23 mmol/L (ref 20–29)
Calcium: 9.7 mg/dL (ref 8.7–10.3)
Chloride: 100 mmol/L (ref 96–106)
Creatinine, Ser: 0.73 mg/dL (ref 0.57–1.00)
GFR calc Af Amer: 93 mL/min/{1.73_m2} (ref >59)
GFR calc non Af Amer: 81 mL/min/{1.73_m2} (ref >59)
Glucose: 101 mg/dL — ABNORMAL HIGH (ref 65–99)
Phosphorus: 3.8 mg/dL (ref 3.0–4.3)
Potassium: 4.4 mmol/L (ref 3.5–5.2)
Sodium: 140 mmol/L (ref 134–144)

## 2019-03-31 IMAGING — CT CT CHEST LUNG CANCER SCREENING LOW DOSE W/O CM
1 series · 10 of 10 positions shown, 13 images · non-contrast
Comparison: None.

CLINICAL DATA: 77-year-old asymptomatic female current smoker with
55 pack-year smoking history.

EXAM:
CT CHEST WITHOUT CONTRAST LOW-DOSE FOR LUNG CANCER SCREENING
TECHNIQUE: Multidetector CT imaging of the chest was performed following the
standard protocol without IV contrast.

[ct lung segmentation data · axial · 0.61mm/px · z∈[-621,-621]mm · 10 of 301 frames shown]
[frame 1/301  mediastinal]
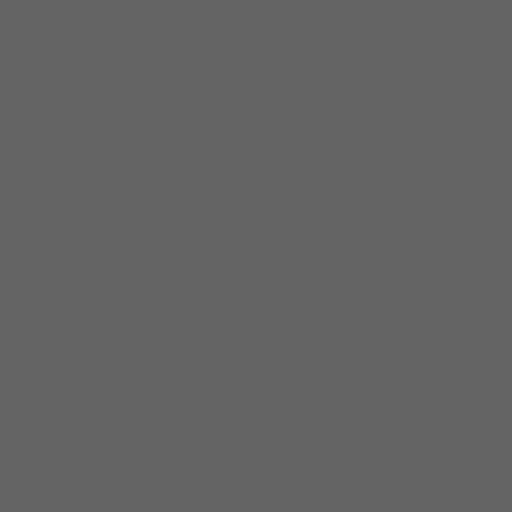
[frame 1/301  lung]
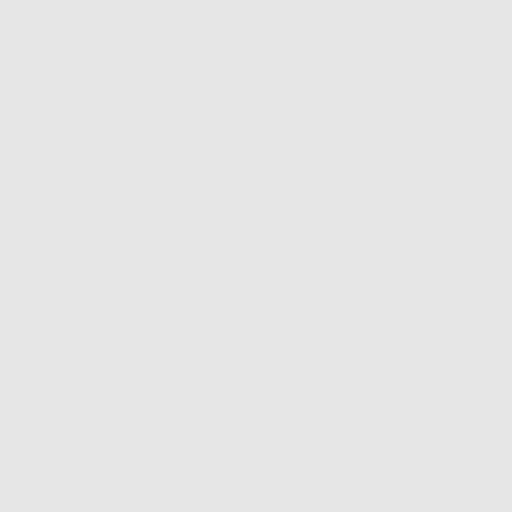
[frame 34/301  lung]
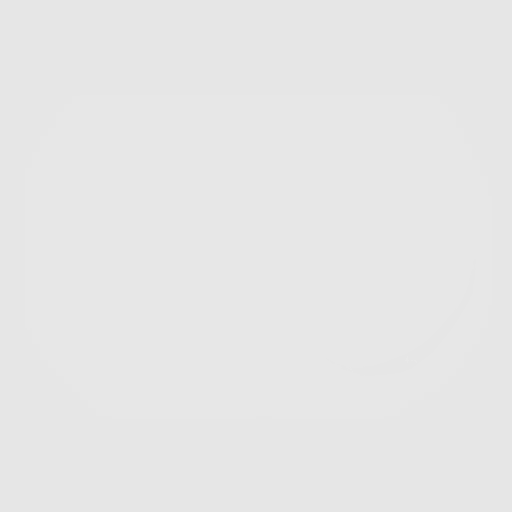
[frame 67/301  lung]
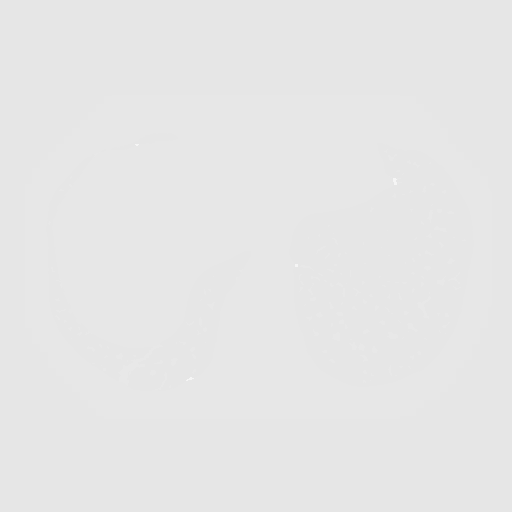
[frame 101/301  lung]
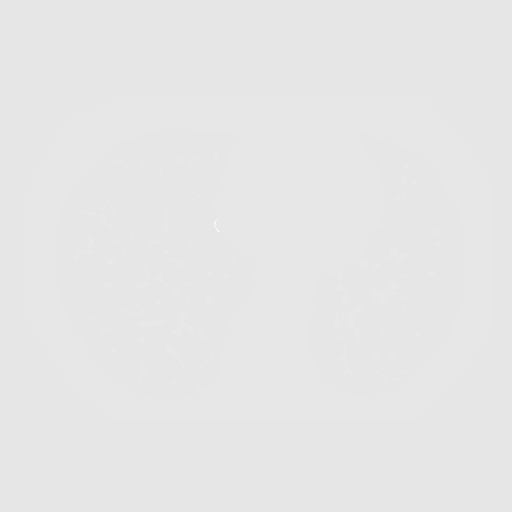
[frame 134/301  mediastinal]
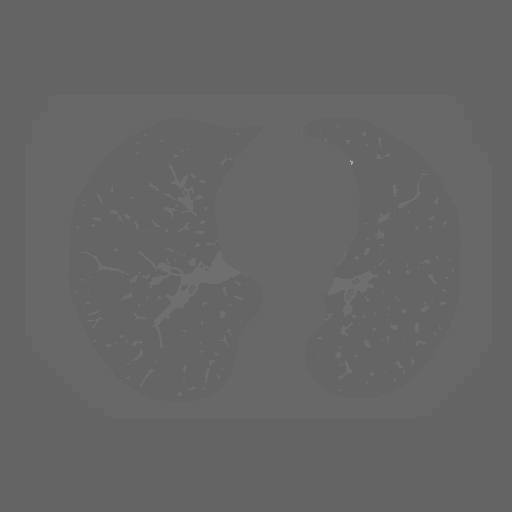
[frame 134/301  lung]
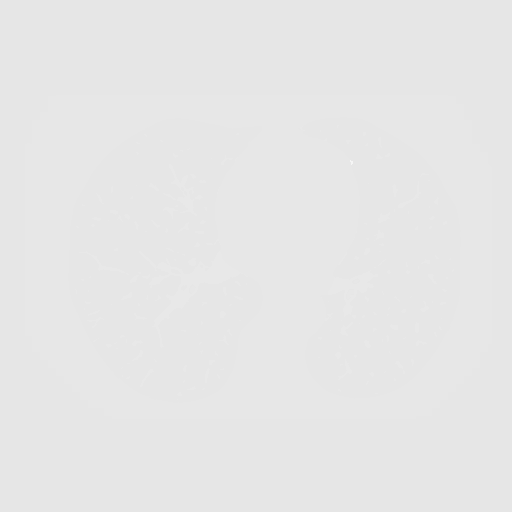
[frame 167/301  lung]
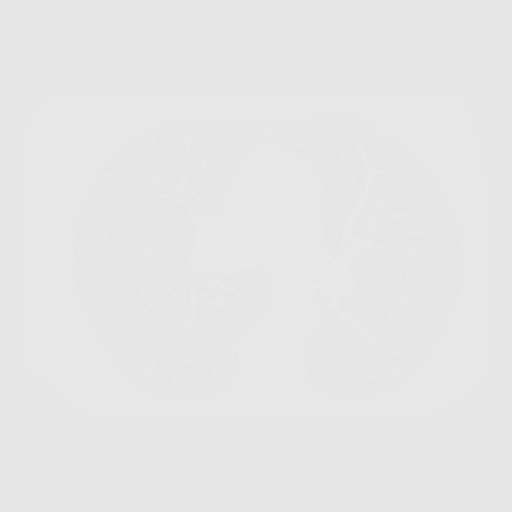
[frame 201/301  lung]
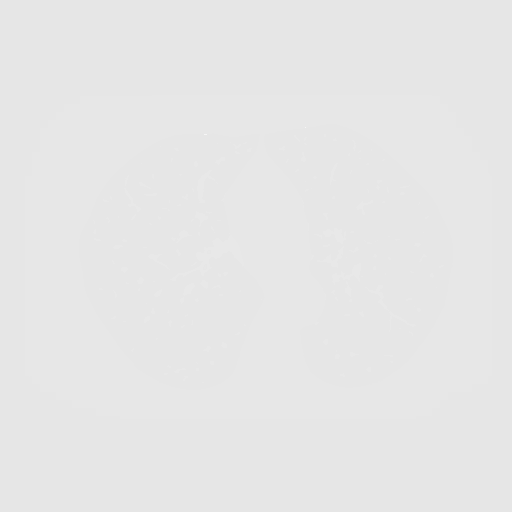
[frame 234/301  lung]
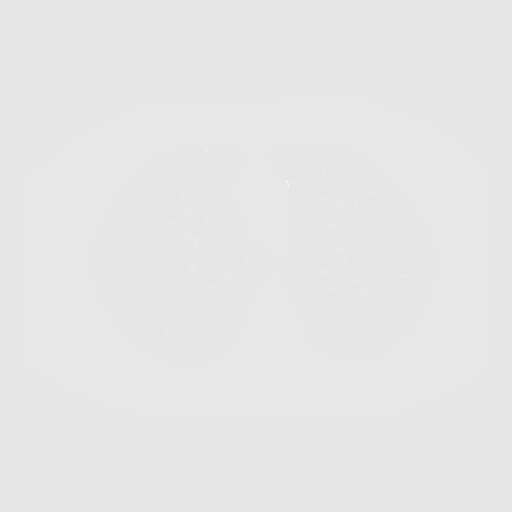
[frame 267/301  mediastinal]
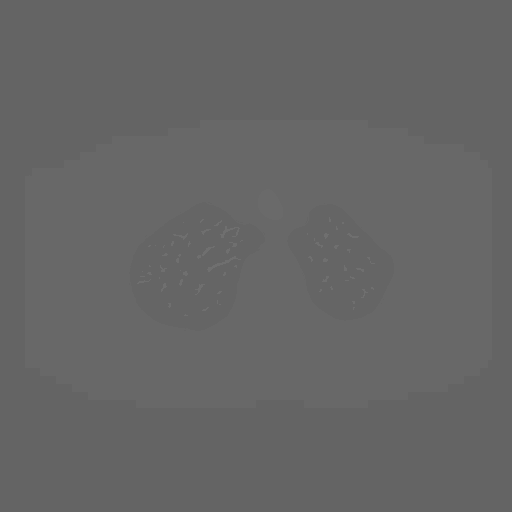
[frame 267/301  lung]
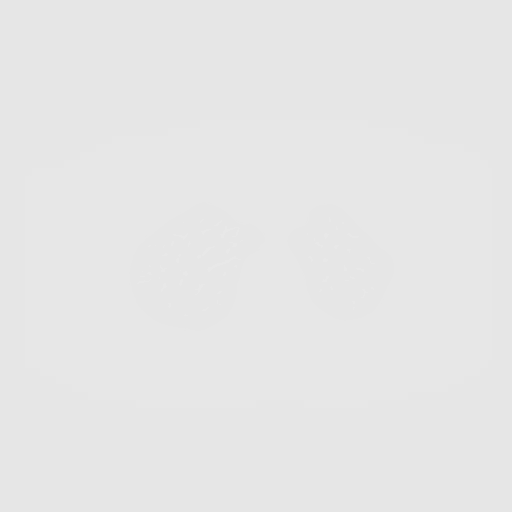
[frame 301/301  lung]
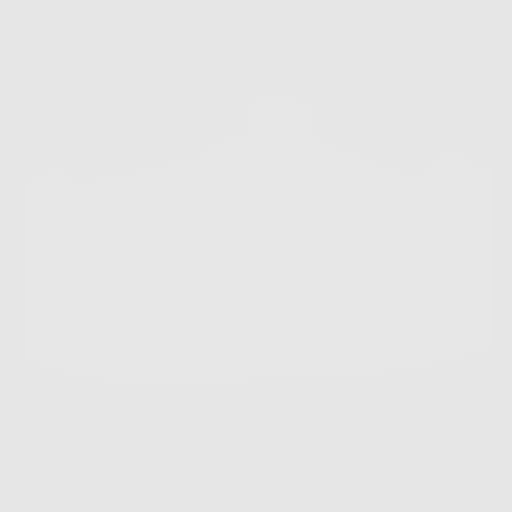

[10 of 10 positions shown; findings below may reference images not displayed]

FINDINGS: Cardiovascular: Normal heart size. No significant pericardial
effusion/thickening. Three-vessel coronary atherosclerosis.
Atherosclerotic nonaneurysmal thoracic aorta. Normal caliber
pulmonary arteries.

Mediastinum/Nodes: No discrete thyroid nodules. Unremarkable
esophagus. No pathologically enlarged axillary, mediastinal or hilar
lymph nodes, noting limited sensitivity for the detection of hilar
adenopathy on this noncontrast study.

Lungs/Pleura: No pneumothorax. No pleural effusion. Mild
centrilobular emphysema with mild diffuse bronchial wall thickening.
No acute consolidative airspace disease or lung masses. No
significant pulmonary nodules.

Upper abdomen: Cholelithiasis.

Musculoskeletal: No aggressive appearing focal osseous lesions.
Marked thoracic spondylosis.
IMPRESSION: 1. Lung-RADS 1, negative. Continue annual screening with low-dose
chest CT without contrast in 12 months.
2. Three-vessel coronary atherosclerosis.
3. Cholelithiasis.

Aortic Atherosclerosis (UVS0D-WB4.4) and Emphysema ([id number].N).

## 2019-04-03 ENCOUNTER — Ambulatory Visit: Payer: Medicare Other

## 2019-06-17 ENCOUNTER — Other Ambulatory Visit: Payer: Self-pay

## 2019-06-17 DIAGNOSIS — D500 Iron deficiency anemia secondary to blood loss (chronic): Secondary | ICD-10-CM

## 2019-06-23 ENCOUNTER — Other Ambulatory Visit: Payer: Medicare Other

## 2019-06-24 ENCOUNTER — Ambulatory Visit: Payer: Medicare Other | Admitting: Hematology and Oncology

## 2019-06-24 ENCOUNTER — Ambulatory Visit: Payer: Medicare Other

## 2019-06-26 ENCOUNTER — Ambulatory Visit: Payer: Medicare Other | Admitting: Hematology and Oncology

## 2019-06-26 ENCOUNTER — Ambulatory Visit: Payer: Medicare Other

## 2019-07-14 ENCOUNTER — Other Ambulatory Visit: Payer: Self-pay

## 2019-07-14 ENCOUNTER — Inpatient Hospital Stay: Payer: Medicare Other | Attending: Hematology and Oncology

## 2019-07-14 DIAGNOSIS — I10 Essential (primary) hypertension: Secondary | ICD-10-CM | POA: Insufficient documentation

## 2019-07-14 DIAGNOSIS — K621 Rectal polyp: Secondary | ICD-10-CM | POA: Diagnosis not present

## 2019-07-14 DIAGNOSIS — J3489 Other specified disorders of nose and nasal sinuses: Secondary | ICD-10-CM | POA: Diagnosis not present

## 2019-07-14 DIAGNOSIS — K922 Gastrointestinal hemorrhage, unspecified: Secondary | ICD-10-CM | POA: Insufficient documentation

## 2019-07-14 DIAGNOSIS — Z8711 Personal history of peptic ulcer disease: Secondary | ICD-10-CM | POA: Insufficient documentation

## 2019-07-14 DIAGNOSIS — E1142 Type 2 diabetes mellitus with diabetic polyneuropathy: Secondary | ICD-10-CM | POA: Diagnosis not present

## 2019-07-14 DIAGNOSIS — Z803 Family history of malignant neoplasm of breast: Secondary | ICD-10-CM | POA: Diagnosis not present

## 2019-07-14 DIAGNOSIS — Z79899 Other long term (current) drug therapy: Secondary | ICD-10-CM | POA: Diagnosis not present

## 2019-07-14 DIAGNOSIS — M549 Dorsalgia, unspecified: Secondary | ICD-10-CM | POA: Insufficient documentation

## 2019-07-14 DIAGNOSIS — F17210 Nicotine dependence, cigarettes, uncomplicated: Secondary | ICD-10-CM | POA: Diagnosis not present

## 2019-07-14 DIAGNOSIS — D500 Iron deficiency anemia secondary to blood loss (chronic): Secondary | ICD-10-CM | POA: Diagnosis not present

## 2019-07-14 LAB — IRON AND TIBC
Iron: 49 ug/dL (ref 28–170)
Saturation Ratios: 12 % (ref 10.4–31.8)
TIBC: 405 ug/dL (ref 250–450)
UIBC: 356 ug/dL

## 2019-07-14 LAB — CBC WITH DIFFERENTIAL/PLATELET
Abs Immature Granulocytes: 0.02 10*3/uL (ref 0.00–0.07)
Basophils Absolute: 0.1 10*3/uL (ref 0.0–0.1)
Basophils Relative: 1 %
Eosinophils Absolute: 0.3 10*3/uL (ref 0.0–0.5)
Eosinophils Relative: 4 %
HCT: 37.2 % (ref 36.0–46.0)
Hemoglobin: 12.1 g/dL (ref 12.0–15.0)
Immature Granulocytes: 0 %
Lymphocytes Relative: 38 %
Lymphs Abs: 2.7 10*3/uL (ref 0.7–4.0)
MCH: 27.8 pg (ref 26.0–34.0)
MCHC: 32.5 g/dL (ref 30.0–36.0)
MCV: 85.5 fL (ref 80.0–100.0)
Monocytes Absolute: 0.7 10*3/uL (ref 0.1–1.0)
Monocytes Relative: 9 %
Neutro Abs: 3.5 10*3/uL (ref 1.7–7.7)
Neutrophils Relative %: 48 %
Platelets: 330 10*3/uL (ref 150–400)
RBC: 4.35 MIL/uL (ref 3.87–5.11)
RDW: 16.1 % — ABNORMAL HIGH (ref 11.5–15.5)
WBC: 7.3 10*3/uL (ref 4.0–10.5)
nRBC: 0 % (ref 0.0–0.2)

## 2019-07-14 LAB — FERRITIN: Ferritin: 27 ng/mL (ref 11–307)

## 2019-07-14 LAB — BASIC METABOLIC PANEL
Anion gap: 7 (ref 5–15)
BUN: 18 mg/dL (ref 8–23)
CO2: 24 mmol/L (ref 22–32)
Calcium: 9.1 mg/dL (ref 8.9–10.3)
Chloride: 101 mmol/L (ref 98–111)
Creatinine, Ser: 0.65 mg/dL (ref 0.44–1.00)
GFR calc Af Amer: >60 mL/min (ref >60)
GFR calc non Af Amer: >60 mL/min (ref >60)
Glucose, Bld: 108 mg/dL — ABNORMAL HIGH (ref 70–99)
Potassium: 3.5 mmol/L (ref 3.5–5.1)
Sodium: 132 mmol/L — ABNORMAL LOW (ref 135–145)

## 2019-07-15 NOTE — Progress Notes (Signed)
[facility]  [address] [place], [place] [address] Phone: [phone]  Fax: [phone]   Clinic Day:  07/17/2019  Referring physician: Juline Patch, MD  Chief Complaint: Angelica Walker is a 78 y.o. female with a iron deficiency anemia who is seen for new patient assessment.   HPI: The patient was diagnosed with iron deficiency anemia secondary to gastric ulcer/GI bleed in 03/2018.   The patient was initially seen by Dr. Ignatius Specking on 07/02/2018.  At that time, she described an episode of abdominal discomfort/nausea that led to a further work-up.  EGD revealed a gastric ulcer,  likely NSAID related.  Hemoglobin was around 7.  She stopped taking NSAIDs.  She was on oral iron every other day.    Patient received Venofer weekly x 4 (10/08/2018 -10/29/2018).   Ferritin has been followed: 9 on 01/18/2018, 11 on 03/18/2018, 13 on 09/30/2018, 27 on 07/14/2019.  Abdomen and pelvis CT on 09/19/2016 revealed no acute abdominal/pelvis findings, mass lesions or lymphadenopathy.   Patient has been followed by Dr. Gustavo Lah in gastroenterology. She was noted to have multiple gastric ulcers on 09/04/2018.  IDA was likely related to chronic blood loss/gastric ulcers. Hemoglobin was stable; she was anemic and iron deficient. She continued oral iron daily and PPI.  Fasting gastrin level was due to persistent ulcers.   Upper endoscopy on 12/07/2017 revealed  Z-line variable.  There was a non-obstructing non-bleeding gastric ulcer with no stigmata of bleeding. The duodenum was normal. Biopsies showed chronic nonspecific gastritis.  There was no H. pylori, dysplasia or malignancy.  Colonoscopy on 12/07/2017 revealed three 1 to 2 mm polyps in the rectum, removed. Diverticulosis in the sigmoid colon. Tortuous colon. 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  Pathology revealed 3 hyperplastic polyps.  Upper Endoscopy on 03/21/2018 revealed Z-line variable.  There was  erosive gastritis with healing ulceration in the stomach.  There was no H. pylori, dysplasia or malignancy.  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     Upper Endoscopy on 07/22/2018 revealed Z-line variable.  There was non-bleeding gastric ulcer with no stigmata of bleeding.  Acquired deformity in the gastric body (posterior wall). 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     The patient was last seen in the clinic on 12/24/2018 by Dr Rogue Bussing. She noted improvement in energy levels s/p IV iron. She continued to have intermittent fatigue. She continued to smoke. Hemoglobin was 12.3.   She was seen last by Faythe Casa, NP on 01/21/2019. Low dose chest CT on 01/21/2019 revealed Lung-RADS 1.  Labs on 07/14/2019: Hematocrit 37.2, hemoglobin 12.1, MCV 85.5, platelets 330,000, WBC 7,300. Sodium 132.    Symptomatically, the patient is doing "pretty good". She notes having no appetite, and some diarrhea. She denies any blood in her stools, or melena. She notes "uncomfortably feeling" and is taking antacids for heartburn. She notes stable weight. She has rhinorrhea, which is related to allergies.   She notes intermittent back pain which she attributes to degenerative disc disease. 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  She notes having a capsule study, and the test was negative.  She denies any blood transfusions.   Past Medical History:  Diagnosis Date  . COPD (chronic obstructive pulmonary disease) ([place])   . Diabetes mellitus  without complication ([place])   . GERD (gastroesophageal reflux disease)   . Hyperlipidemia   . Hypertension   . Serum lipids high     Past Surgical History:  Procedure Laterality Date  . COLONOSCOPY WITH PROPOFOL N/A 12/07/2017    Procedure: COLONOSCOPY WITH PROPOFOL;  Surgeon: Lollie Sails, MD;  Location: [facility] ENDOSCOPY;  Service: Endoscopy;  Laterality: N/A;  . ESOPHAGOGASTRODUODENOSCOPY (EGD) WITH PROPOFOL N/A 12/07/2017   Procedure: ESOPHAGOGASTRODUODENOSCOPY (EGD) WITH PROPOFOL;  Surgeon: Lollie Sails, MD;  Location: [facility] ENDOSCOPY;  Service: Endoscopy;  Laterality: N/A;  . ESOPHAGOGASTRODUODENOSCOPY (EGD) WITH PROPOFOL N/A 03/21/2018   Procedure: ESOPHAGOGASTRODUODENOSCOPY (EGD) WITH PROPOFOL;  Surgeon: Lollie Sails, MD;  Location: [facility] ENDOSCOPY;  Service: Endoscopy;  Laterality: N/A;  . ESOPHAGOGASTRODUODENOSCOPY (EGD) WITH PROPOFOL N/A 07/22/2018   Procedure: ESOPHAGOGASTRODUODENOSCOPY (EGD) WITH PROPOFOL;  Surgeon: Lollie Sails, MD;  Location: [facility] ENDOSCOPY;  Service: Endoscopy;  Laterality: N/A;    Family History  Problem Relation Age of Onset  . Breast cancer Maternal Aunt     Social History:  reports that she has been smoking cigarettes. She has a 54.00 pack-year smoking history. She has never used smokeless tobacco. She reports that she does not drink alcohol or use drugs. She is smoking 1 pack per day for 20 years. She has one child. She used to work in [employer] for 27 years; she is retired. The patient is alone today.  Allergies:  Allergies  Allergen Reactions  . Simvastatin Other (See Comments)    Current Medications: Current Outpatient Medications  Medication Sig Dispense Refill  . acetaminophen (TYLENOL) 500 MG tablet Take 1,000 mg by mouth 2 (two) times daily as needed.    . Ascorbic Acid (VITAMIN C) 100 MG tablet Take 100 mg by mouth daily.    . Calcium 600-200 MG-UNIT tablet Take 1 tablet by mouth daily.     . Cyanocobalamin (B-12) 1000 MCG TABS Take 1 tablet by mouth daily. 30 tablet   . gabapentin (NEURONTIN) 100 MG capsule Take 1 capsule (100 mg total) by mouth at bedtime. 90 capsule 1  . lisinopril-hydrochlorothiazide (PRINZIDE,ZESTORETIC) 20-12.5 MG tablet Take 1  tablet by mouth daily. 90 tablet 1  . pantoprazole (PROTONIX) 40 MG tablet Take 1 tablet (40 mg total) by mouth daily. [place] 90 tablet 1   No current facility-administered medications for this visit.     Review of Systems  Constitutional: Positive for malaise/fatigue (intermittent). Negative for chills, fever and weight loss.       Doing "pretty good".  HENT: Negative for congestion, hearing loss, sore throat and tinnitus.        Rhinorrhea.  Eyes: Negative for blurred vision and double vision.  Respiratory: Negative for cough, hemoptysis and shortness of breath.        COPD.  Cardiovascular: Negative for chest pain, palpitations and leg swelling.  Gastrointestinal: Positive for diarrhea and heartburn. Negative for abdominal pain, blood in stool, constipation, melena, nausea and vomiting.       Gastri ulcer 03/2018. Poor appetite.  Genitourinary: Negative for dysuria, frequency, hematuria and urgency.  Musculoskeletal: Positive for back pain (degenerative disc disease, intermittent). Negative for joint pain, myalgias and neck pain.  Skin: Negative for rash.  Neurological: Negative for dizziness, tingling, sensory change, weakness and headaches.       Peripheral neuropathy (left hand).  Endo/Heme/Allergies: Does not bruise/bleed easily.       Diabetes.  Psychiatric/Behavioral: Negative for depression and memory loss. The patient is not nervous/anxious and does  not have insomnia.   All other systems reviewed and are negative.  Performance status (ECOG): 1  Vitals Blood pressure 126/66, pulse 70, temperature 98.2 F (36.8 C), temperature source Tympanic, resp. rate 18, height 5\' 5"  (1.651 m), weight 125 lb 10.6 oz (57 kg), SpO2 98 %.   Physical Exam  Constitutional: She is oriented to person, place, and time. She appears well-developed and well-nourished. No distress.  HENT:  Head: Normocephalic and atraumatic.  Mouth/Throat: Oropharynx is clear and moist. No oropharyngeal  exudate.  Brown hair. Mask.  Eyes: Pupils are equal, round, and reactive to light. Conjunctivae and EOM are normal. No scleral icterus.  Glasses.  Blue eyes.  Neck: Normal range of motion. Neck supple. No JVD present.  Cardiovascular: Normal rate, regular rhythm and normal heart sounds.  No murmur heard. Pulmonary/Chest: Effort normal and breath sounds normal. No respiratory distress. She has no wheezes. She has no rales. She exhibits no tenderness.  Abdominal: Soft. Bowel sounds are normal. She exhibits no distension and no mass. There is no abdominal tenderness. There is no rebound and no guarding.  Musculoskeletal: Normal range of motion.        General: No tenderness or edema.  Lymphadenopathy:    She has no cervical adenopathy.    She has no axillary adenopathy.       Right: No supraclavicular adenopathy present.       Left: No supraclavicular adenopathy present.  Neurological: She is alert and oriented to person, place, and time. She has normal reflexes.  Skin: Skin is warm and dry. No rash noted. She is not diaphoretic. No erythema. No pallor.  Psychiatric: She has a normal mood and affect. Her behavior is normal. Judgment and thought content normal.  Nursing note and vitals reviewed.   No visits with results within 3 Day(s) from this visit.  Latest known visit with results is:  Appointment on 07/14/2019  Component Date Value Ref Range Status  . Sodium 07/14/2019 132* 135 - 145 mmol/L Final  . Potassium 07/14/2019 3.5  3.5 - 5.1 mmol/L Final  . Chloride 07/14/2019 101  98 - 111 mmol/L Final  . CO2 07/14/2019 24  22 - 32 mmol/L Final  . Glucose, Bld 07/14/2019 108* 70 - 99 mg/dL Final  . BUN 07/14/2019 18  8 - 23 mg/dL Final  . Creatinine, Ser 07/14/2019 0.65  0.44 - 1.00 mg/dL Final  . Calcium 07/14/2019 9.1  8.9 - 10.3 mg/dL Final  . GFR calc non Af Amer 07/14/2019 >60  >60 mL/min Final  . GFR calc Af Amer 07/14/2019 >60  >60 mL/min Final  . Anion gap 07/14/2019 7  5 - 15  Final   Performed at [facility], [address], [place], [place] [address]  . Iron 07/14/2019 49  28 - 170 ug/dL Final  . TIBC 07/14/2019 405  250 - 450 ug/dL Final  . Saturation Ratios 07/14/2019 12  10.4 - 31.8 % Final  . UIBC 07/14/2019 356  ug/dL Final   Performed at [facility], [address], [place], [place] [address]  . Ferritin 07/14/2019 27  11 - 307 ng/mL Final   Performed at [facility], [address] [place], [place] [address]  . WBC 07/14/2019 7.3  4.0 - 10.5 K/uL Final  . RBC 07/14/2019 4.35  3.87 - 5.11 MIL/uL Final  . Hemoglobin 07/14/2019 12.1  12.0 - 15.0 g/dL Final  . HCT 07/14/2019 37.2  36.0 - 46.0 % Final  .  MCV 07/14/2019 85.5  80.0 - 100.0 fL Final  . MCH 07/14/2019 27.8  26.0 - 34.0 pg Final  . MCHC 07/14/2019 32.5  30.0 - 36.0 g/dL Final  . RDW 07/14/2019 16.1* 11.5 - 15.5 % Final  . Platelets 07/14/2019 330  150 - 400 K/uL Final  . nRBC 07/14/2019 0.0  0.0 - 0.2 % Final  . Neutrophils Relative % 07/14/2019 48  % Final  . Neutro Abs 07/14/2019 3.5  1.7 - 7.7 K/uL Final  . Lymphocytes Relative 07/14/2019 38  % Final  . Lymphs Abs 07/14/2019 2.7  0.7 - 4.0 K/uL Final  . Monocytes Relative 07/14/2019 9  % Final  . Monocytes Absolute 07/14/2019 0.7  0.1 - 1.0 K/uL Final  . Eosinophils Relative 07/14/2019 4  % Final  . Eosinophils Absolute 07/14/2019 0.3  0.0 - 0.5 K/uL Final  . Basophils Relative 07/14/2019 1  % Final  . Basophils Absolute 07/14/2019 0.1  0.0 - 0.1 K/uL Final  . Immature Granulocytes 07/14/2019 0  % Final  . Abs Immature Granulocytes 07/14/2019 0.02  0.00 - 0.07 K/uL Final   Performed at [facility], [address] [place], [place] [address]    Assessment:  Angelica Walker is a 78 y.o. female with iron deficiency anemia secondary to gastric ulcer and GI bleed in 03/2018.  She is intolerant of oral iron.  She has received Venofer weekly x 4 (10/08/2018 -10/29/2018).   Ferritin has been  followed: 9 on 01/18/2018, 11 on 03/18/2018, 13 on 09/30/2018, 27 on 07/14/2019.  EGD on 12/07/2017 revealed  Z-line variable.  There was a non-obstructing non-bleeding gastric ulcer with no stigmata of bleeding. The duodenum was normal. Biopsies showed chronic nonspecific gastritis.  There was no H. pylori, dysplasia or malignancy. EGD on 03/21/2018 revealed Z-line variable.  There was erosive gastritis with healing ulceration in the stomach.  There was no H. pylori, dysplasia or malignancy.  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 EGD on 07/22/2018 revealed Z-line variable.  There was non-bleeding gastric ulcer with no stigmata of bleeding.  Acquired deformity in the gastric body (posterior wall).  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     Colonoscopy on 12/07/2017 revealed three 1 to 2 mm polyps in the rectum, removed. Diverticulosis in the sigmoid colon. Tortuous colon. 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  Pathology revealed 3 hyperplastic polyps.  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       She has a smoking history.  Low dose chest CT on 01/21/2019 was Lung-RADS 1.  Symptomatically, she notes "good and bad days".  Weight has been stable x 2 years.  Plan: 1.   Review labs from 07/14/2019. 2.   Iron deficiency anemia  Review entire medical history, diagnosis and management of iron deficiency.    Multiple EGDs and capsule study reviewed.  She has had gastric ulcers, gastritis, and angiectasias.  Hematocrit 37.2.  Hemoglobin 12.1.  MCV 85.5.  Ferritin 27.    Discuss plan to maintain ferritin > 30.  Venofer today. 3.   RTC in 3 months for labs (CBC with diff, ferritin). 4.   RTC in 6 months for MD assessment, labs (CBC with diff, ferritin- day before), and +/- Venofer.  I discussed  the assessment and treatment plan with the patient.  The patient was provided  an opportunity to ask questions and all were answered.  The patient agreed with the plan and demonstrated an understanding of the instructions.  The patient was advised to call back if the symptoms worsen or if the condition fails to improve as anticipated.   Melissa C. Mike Gip, MD, PhD    07/17/2019, 10:44 AM  I, Selena Batten, am acting as scribe for [place]. Mike Gip, MD, PhD.  I, Melissa C. Mike Gip, MD, have reviewed the above documentation for accuracy and completeness, and I agree with the above.

## 2019-07-17 ENCOUNTER — Inpatient Hospital Stay: Payer: Medicare Other

## 2019-07-17 ENCOUNTER — Encounter: Payer: Self-pay | Admitting: Hematology and Oncology

## 2019-07-17 ENCOUNTER — Other Ambulatory Visit: Payer: Self-pay

## 2019-07-17 ENCOUNTER — Inpatient Hospital Stay (HOSPITAL_BASED_OUTPATIENT_CLINIC_OR_DEPARTMENT_OTHER): Payer: Medicare Other | Admitting: Hematology and Oncology

## 2019-07-17 VITALS — BP 130/65 | HR 71 | Temp 98.0°F | Resp 18

## 2019-07-17 VITALS — BP 126/66 | HR 70 | Temp 98.2°F | Resp 18 | Ht 65.0 in | Wt 125.7 lb

## 2019-07-17 DIAGNOSIS — I10 Essential (primary) hypertension: Secondary | ICD-10-CM

## 2019-07-17 DIAGNOSIS — Z803 Family history of malignant neoplasm of breast: Secondary | ICD-10-CM | POA: Diagnosis not present

## 2019-07-17 DIAGNOSIS — Z79899 Other long term (current) drug therapy: Secondary | ICD-10-CM

## 2019-07-17 DIAGNOSIS — E1142 Type 2 diabetes mellitus with diabetic polyneuropathy: Secondary | ICD-10-CM

## 2019-07-17 DIAGNOSIS — K922 Gastrointestinal hemorrhage, unspecified: Secondary | ICD-10-CM

## 2019-07-17 DIAGNOSIS — M549 Dorsalgia, unspecified: Secondary | ICD-10-CM

## 2019-07-17 DIAGNOSIS — D500 Iron deficiency anemia secondary to blood loss (chronic): Secondary | ICD-10-CM

## 2019-07-17 DIAGNOSIS — K621 Rectal polyp: Secondary | ICD-10-CM

## 2019-07-17 DIAGNOSIS — J3489 Other specified disorders of nose and nasal sinuses: Secondary | ICD-10-CM | POA: Diagnosis not present

## 2019-07-17 DIAGNOSIS — F17210 Nicotine dependence, cigarettes, uncomplicated: Secondary | ICD-10-CM

## 2019-07-17 DIAGNOSIS — Z8711 Personal history of peptic ulcer disease: Secondary | ICD-10-CM

## 2019-07-17 MED ORDER — SODIUM CHLORIDE 0.9 % IV SOLN: 200.0000 mg | Freq: Once | INTRAVENOUS | Status: DC

## 2019-07-17 MED ORDER — IRON SUCROSE 20 MG/ML IV SOLN
INTRAVENOUS | Status: AC
  Filled 2019-07-17: qty 10

## 2019-07-17 MED ORDER — IRON SUCROSE 20 MG/ML IV SOLN
200.0000 mg | Freq: Once | INTRAVENOUS | Status: AC
  Administered 2019-07-17: 200 mg via INTRAVENOUS

## 2019-07-17 MED ORDER — SODIUM CHLORIDE 0.9 % IV SOLN
Freq: Once | INTRAVENOUS | Status: AC
  Administered 2019-07-17: 11:00:00 via INTRAVENOUS
  Filled 2019-07-17: qty 250

## 2019-07-17 NOTE — Patient Instructions (Signed)

## 2019-07-17 NOTE — Progress Notes (Signed)
No new changes noted today 

## 2019-08-26 ENCOUNTER — Telehealth: Payer: Self-pay

## 2019-08-26 NOTE — Telephone Encounter (Signed)
We received a call from Stann Ore NP for [facility] [employer]- notified us that there are asymptomatic bilateral carotid bruits on exam of Walker. Walker is unaware of this issue. She is scheduled for Oct 1 to discuss this issue and get [place] ordered

## 2019-09-18 ENCOUNTER — Ambulatory Visit (INDEPENDENT_AMBULATORY_CARE_PROVIDER_SITE_OTHER): Payer: Medicare Other | Admitting: Family Medicine

## 2019-09-18 ENCOUNTER — Encounter: Payer: Self-pay | Admitting: Family Medicine

## 2019-09-18 ENCOUNTER — Other Ambulatory Visit: Payer: Self-pay

## 2019-09-18 VITALS — BP 134/70 | HR 80 | Ht 62.0 in | Wt 126.0 lb

## 2019-09-18 DIAGNOSIS — K259 Gastric ulcer, unspecified as acute or chronic, without hemorrhage or perforation: Secondary | ICD-10-CM

## 2019-09-18 DIAGNOSIS — R7303 Prediabetes: Secondary | ICD-10-CM

## 2019-09-18 DIAGNOSIS — G2581 Restless legs syndrome: Secondary | ICD-10-CM | POA: Diagnosis not present

## 2019-09-18 DIAGNOSIS — I10 Essential (primary) hypertension: Secondary | ICD-10-CM

## 2019-09-18 DIAGNOSIS — F17200 Nicotine dependence, unspecified, uncomplicated: Secondary | ICD-10-CM

## 2019-09-18 DIAGNOSIS — R0989 Other specified symptoms and signs involving the circulatory and respiratory systems: Secondary | ICD-10-CM

## 2019-09-18 MED ORDER — PANTOPRAZOLE SODIUM 40 MG PO TBEC: 40.0000 mg | DELAYED_RELEASE_TABLET | Freq: Every day | ORAL | 1 refills | Status: DC

## 2019-09-18 MED ORDER — LISINOPRIL-HYDROCHLOROTHIAZIDE 20-12.5 MG PO TABS: 1.0000 | ORAL_TABLET | Freq: Every day | ORAL | 1 refills | Status: DC

## 2019-09-18 MED ORDER — GABAPENTIN 100 MG PO CAPS: 100.0000 mg | ORAL_CAPSULE | Freq: Every day | ORAL | 1 refills | Status: DC

## 2019-09-18 NOTE — Progress Notes (Signed)
Date:  09/18/2019   Name:  Angelica Walker   DOB:  12-14-1941   MRN:  [id number]   Chief Complaint: Hypertension, prediabetes, Peripheral Neuropathy, and carotid doppler ordered (bruits)  Hypertension This is a chronic problem. The current episode started more than 1 year ago. The problem is unchanged. The problem is controlled. Pertinent negatives include no anxiety, blurred vision, chest pain, headaches, malaise/fatigue, neck pain, orthopnea, palpitations, peripheral edema, PND, shortness of breath or sweats. There are no associated agents to hypertension. Risk factors for coronary artery disease include diabetes mellitus, smoking/tobacco exposure and post-menopausal state. Past treatments include ACE inhibitors and diuretics. The current treatment provides mild improvement. There are no compliance problems.  There is no history of angina, kidney disease, CAD/MI, CVA, heart failure, left ventricular hypertrophy, PVD or retinopathy. There is no history of chronic renal disease, a hypertension causing med or renovascular disease.  Diabetes She presents for her follow-up diabetic visit. She has type 2 diabetes mellitus. Her disease course has been stable. Pertinent negatives for hypoglycemia include no dizziness, headaches, nervousness/anxiousness or sweats. Pertinent negatives for diabetes include no blurred vision, no chest pain, no fatigue, no foot paresthesias, no foot ulcerations, no polydipsia, no polyphagia, no polyuria, no visual change, no weakness and no weight loss. There are no hypoglycemic complications. Diabetic complications include peripheral neuropathy. Pertinent negatives for diabetic complications include no CVA, PVD or retinopathy. Risk factors for coronary artery disease include post-menopausal, hypertension and tobacco exposure. Current diabetic treatment includes diet. She is following a generally healthy diet. An ACE inhibitor/angiotensin II receptor blocker is being taken. She does  not see a podiatrist.Eye exam is not current.  Gastroesophageal Reflux She reports no abdominal pain, no belching, no chest pain, no choking, no coughing, no dysphagia, no early satiety, no globus sensation, no heartburn, no hoarse voice, no nausea, no sore throat, no stridor, no tooth decay, no water brash or no wheezing. neuropathy. This is a chronic problem. The problem has been gradually improving. The symptoms are aggravated by certain foods. Pertinent negatives include no fatigue or weight loss. Risk factors include smoking/tobacco exposure. She has tried a PPI for the symptoms. The treatment provided moderate relief.  Neurologic Problem The patient's pertinent negatives include no altered mental status, clumsiness, focal sensory loss, focal weakness, loss of balance, memory loss, near-syncope, slurred speech, syncope, visual change or weakness. Primary symptoms comment: neuropathy. This is a chronic problem. The current episode started more than 1 year ago. The problem is unchanged. There was lower extremity focality noted. Pertinent negatives include no abdominal pain, back pain, chest pain, diaphoresis, dizziness, fatigue, fever, headaches, nausea, neck pain, palpitations or shortness of breath.    Review of Systems  Constitutional: Negative for chills, diaphoresis, fatigue, fever, malaise/fatigue and weight loss.  HENT: Negative for drooling, ear discharge, ear pain, hoarse voice and sore throat.   Eyes: Negative for blurred vision.  Respiratory: Negative for cough, choking, shortness of breath and wheezing.   Cardiovascular: Negative for chest pain, palpitations, orthopnea, leg swelling, PND and near-syncope.  Gastrointestinal: Negative for abdominal pain, blood in stool, constipation, diarrhea, dysphagia, heartburn and nausea.  Endocrine: Negative for polydipsia, polyphagia and polyuria.  Genitourinary: Negative for dysuria, frequency, hematuria and urgency.  Musculoskeletal: Negative for  back pain, myalgias and neck pain.  Skin: Negative for rash.  Allergic/Immunologic: Negative for environmental allergies.  Neurological: Negative for dizziness, focal weakness, syncope, weakness, headaches and loss of balance.  Hematological: Does not bruise/bleed easily.  Psychiatric/Behavioral: Negative  for memory loss and suicidal ideas. The patient is not nervous/anxious.     Patient Active Problem List   Diagnosis Date Noted   Iron deficiency anemia due to chronic blood loss 07/02/2018   Smoker 03/18/2018   RLS (restless legs syndrome) 01/18/2018   Anemia due to blood loss 12/20/2017   Prediabetes 12/19/2017   Hyperlipidemia, unspecified 12/19/2017   Hypertension, essential, benign 12/19/2017   B12 deficiency 12/19/2017   DDD (degenerative disc disease), lumbar 12/19/2017   Multiple gastric ulcers 12/19/2017   COPD (chronic obstructive pulmonary disease) ([place]) 12/21/2014    Allergies  Allergen Reactions   Simvastatin Other (See Comments)    Past Surgical History:  Procedure Laterality Date   COLONOSCOPY WITH PROPOFOL N/A 12/07/2017   Procedure: COLONOSCOPY WITH PROPOFOL;  Surgeon: Lollie Sails, MD;  Location: [facility] ENDOSCOPY;  Service: Endoscopy;  Laterality: N/A;   ESOPHAGOGASTRODUODENOSCOPY (EGD) WITH PROPOFOL N/A 12/07/2017   Procedure: ESOPHAGOGASTRODUODENOSCOPY (EGD) WITH PROPOFOL;  Surgeon: Lollie Sails, MD;  Location: [facility] ENDOSCOPY;  Service: Endoscopy;  Laterality: N/A;   ESOPHAGOGASTRODUODENOSCOPY (EGD) WITH PROPOFOL N/A 03/21/2018   Procedure: ESOPHAGOGASTRODUODENOSCOPY (EGD) WITH PROPOFOL;  Surgeon: Lollie Sails, MD;  Location: [facility] ENDOSCOPY;  Service: Endoscopy;  Laterality: N/A;   ESOPHAGOGASTRODUODENOSCOPY (EGD) WITH PROPOFOL N/A 07/22/2018   Procedure: ESOPHAGOGASTRODUODENOSCOPY (EGD) WITH PROPOFOL;  Surgeon: Lollie Sails, MD;  Location: [facility] ENDOSCOPY;  Service: Endoscopy;  Laterality: N/A;    Social History   Tobacco  Use   Smoking status: Current Every Day Smoker    Packs/day: 1.00    Years: 54.00    Pack years: 54.00    Types: Cigarettes   Smokeless tobacco: Never Used   Tobacco comment: patches and pills discussed  Substance Use Topics   Alcohol use: No    Frequency: Never   Drug use: No     Medication list has been reviewed and updated.  Current Meds  Medication Sig   acetaminophen (TYLENOL) 500 MG tablet Take 1,000 mg by mouth 2 (two) times daily as needed.   Ascorbic Acid (VITAMIN C) 100 MG tablet Take 100 mg by mouth daily.   Calcium 600-200 MG-UNIT tablet Take 1 tablet by mouth daily.    Cyanocobalamin (B-12) 1000 MCG TABS Take 1 tablet by mouth daily.   gabapentin (NEURONTIN) 100 MG capsule Take 1 capsule (100 mg total) by mouth at bedtime.   lisinopril-hydrochlorothiazide (PRINZIDE,ZESTORETIC) 20-12.5 MG tablet Take 1 tablet by mouth daily.   pantoprazole (PROTONIX) 40 MG tablet Take 1 tablet (40 mg total) by mouth daily. [place]    [facility] 2/9 Scores 03/19/2019 03/13/2018 12/19/2017  PHQ - 2 Score 0 0 0  PHQ- 9 Score 1 0 -    BP Readings from Last 3 Encounters:  09/18/19 134/70  07/17/19 130/65  07/17/19 126/66    Physical Exam  Wt Readings from Last 3 Encounters:  09/18/19 126 lb (57.2 kg)  07/17/19 125 lb 10.6 oz (57 kg)  03/19/19 127 lb (57.6 kg)    BP 134/70    Pulse 80    Ht 5\' 2"  (1.575 m)    Wt 126 lb (57.2 kg)    BMI 23.05 kg/m    Assessment and Plan: 1. RLS (restless legs syndrome) Patient was given gabapentin by Dr. Jacqulyn Liner.  This is for restless leg syndrome but probably is helped with some possible neuropathy concerns. - gabapentin (NEURONTIN) 100 MG capsule; Take 1 capsule (100 mg total) by mouth at bedtime.  Dispense: 90 capsule; Refill: 1  2. Hypertension, essential, benign Chronic.  Controlled.  Continue lisinopril hydrochlorothiazide chlorothiazide 20-12.5 mg daily.  Will check renal function panel. - lisinopril-hydrochlorothiazide  (ZESTORETIC) 20-12.5 MG tablet; Take 1 tablet by mouth daily.  Dispense: 90 tablet; Refill: 1 - Renal Function Panel  3. Prediabetes Patient with A1c's in the 5.9 range.  Patient is currently controlling with diet.  Will continue with this plan.  Will check A1c renal function with renal panel.  And microalbuminuria. - HgB A1c - Microalbumin, urine - Renal Function Panel - Lipid Panel With LDL/HDL Ratio  4. Multiple gastric ulcers Patient is followed by Dr. Gustavo Lah and is currently on pantoprazole 40 mg once a day.  Will continue this regimen. - pantoprazole (PROTONIX) 40 MG tablet; Take 1 tablet (40 mg total) by mouth daily. [place]  Dispense: 90 tablet; Refill: 1  5. Tobacco dependence Patient has been advised of the health risks of smoking and counseled concerning cessation of tobacco products. I spent over 3 minutes for discussion and to answer questions.  6. Bilateral carotid bruits Patient on outpatient nurse exam was noted to have asymptomatic bilateral carotid bruits.  We will further evaluate with bilateral ultrasound to determine degree of stenosis. - US Carotid Duplex Bilateral; Future

## 2019-09-19 LAB — LIPID PANEL WITH LDL/HDL RATIO
Cholesterol, Total: 227 mg/dL — ABNORMAL HIGH (ref 100–199)
HDL: 49 mg/dL (ref >39)
LDL Chol Calc (NIH): 154 mg/dL — ABNORMAL HIGH (ref 0–99)
LDL/HDL Ratio: 3.1 ratio (ref 0.0–3.2)
Triglycerides: 133 mg/dL (ref 0–149)
VLDL Cholesterol Cal: 24 mg/dL (ref 5–40)

## 2019-09-19 LAB — RENAL FUNCTION PANEL
Albumin: 4.4 g/dL (ref 3.7–4.7)
BUN/Creatinine Ratio: 14 (ref 12–28)
BUN: 10 mg/dL (ref 8–27)
CO2: 27 mmol/L (ref 20–29)
Calcium: 9.8 mg/dL (ref 8.7–10.3)
Chloride: 101 mmol/L (ref 96–106)
Creatinine, Ser: 0.69 mg/dL (ref 0.57–1.00)
GFR calc Af Amer: 98 mL/min/{1.73_m2} (ref >59)
GFR calc non Af Amer: 85 mL/min/{1.73_m2} (ref >59)
Glucose: 102 mg/dL — ABNORMAL HIGH (ref 65–99)
Phosphorus: 3.5 mg/dL (ref 3.0–4.3)
Potassium: 4.3 mmol/L (ref 3.5–5.2)
Sodium: 140 mmol/L (ref 134–144)

## 2019-09-19 LAB — HEMOGLOBIN A1C
Est. average glucose Bld gHb Est-mCnc: 120 mg/dL
Hgb A1c MFr Bld: 5.8 % — ABNORMAL HIGH (ref 4.8–5.6)

## 2019-09-19 LAB — MICROALBUMIN, URINE: Microalbumin, Urine: <3 ug/mL

## 2019-09-29 ENCOUNTER — Ambulatory Visit: Payer: Medicare Other | Attending: Family Medicine

## 2019-10-01 ENCOUNTER — Ambulatory Visit
Admission: RE | Admit: 2019-10-01 | Discharge: 2019-10-01 | Disposition: A | Discharge status: Home / Self Care | Payer: Medicare Other | Source: Ambulatory Visit | Attending: Family Medicine | Admitting: Family Medicine

## 2019-10-01 ENCOUNTER — Other Ambulatory Visit: Payer: Self-pay

## 2019-10-01 DIAGNOSIS — Z1231 Encounter for screening mammogram for malignant neoplasm of breast: Secondary | ICD-10-CM | POA: Diagnosis not present

## 2019-10-16 ENCOUNTER — Inpatient Hospital Stay: Payer: Medicare Other | Attending: Hematology and Oncology

## 2020-01-14 ENCOUNTER — Encounter: Payer: Self-pay | Admitting: Hematology and Oncology

## 2020-01-14 ENCOUNTER — Other Ambulatory Visit: Payer: Self-pay

## 2020-01-14 ENCOUNTER — Inpatient Hospital Stay: Payer: Medicare Other | Attending: Hematology and Oncology

## 2020-01-14 DIAGNOSIS — K922 Gastrointestinal hemorrhage, unspecified: Secondary | ICD-10-CM | POA: Insufficient documentation

## 2020-01-14 DIAGNOSIS — Z79899 Other long term (current) drug therapy: Secondary | ICD-10-CM | POA: Insufficient documentation

## 2020-01-14 DIAGNOSIS — D500 Iron deficiency anemia secondary to blood loss (chronic): Secondary | ICD-10-CM | POA: Insufficient documentation

## 2020-01-14 DIAGNOSIS — E119 Type 2 diabetes mellitus without complications: Secondary | ICD-10-CM | POA: Insufficient documentation

## 2020-01-14 DIAGNOSIS — I10 Essential (primary) hypertension: Secondary | ICD-10-CM | POA: Insufficient documentation

## 2020-01-14 DIAGNOSIS — F17210 Nicotine dependence, cigarettes, uncomplicated: Secondary | ICD-10-CM | POA: Diagnosis not present

## 2020-01-14 LAB — CBC WITH DIFFERENTIAL/PLATELET
Abs Immature Granulocytes: 0.02 10*3/uL (ref 0.00–0.07)
Basophils Absolute: 0.1 10*3/uL (ref 0.0–0.1)
Basophils Relative: 1 %
Eosinophils Absolute: 0.2 10*3/uL (ref 0.0–0.5)
Eosinophils Relative: 2 %
HCT: 33.9 % — ABNORMAL LOW (ref 36.0–46.0)
Hemoglobin: 11.1 g/dL — ABNORMAL LOW (ref 12.0–15.0)
Immature Granulocytes: 0 %
Lymphocytes Relative: 35 %
Lymphs Abs: 2.8 10*3/uL (ref 0.7–4.0)
MCH: 28.8 pg (ref 26.0–34.0)
MCHC: 32.7 g/dL (ref 30.0–36.0)
MCV: 87.8 fL (ref 80.0–100.0)
Monocytes Absolute: 0.9 10*3/uL (ref 0.1–1.0)
Monocytes Relative: 11 %
Neutro Abs: 4 10*3/uL (ref 1.7–7.7)
Neutrophils Relative %: 51 %
Platelets: 306 10*3/uL (ref 150–400)
RBC: 3.86 MIL/uL — ABNORMAL LOW (ref 3.87–5.11)
RDW: 14.8 % (ref 11.5–15.5)
WBC: 7.9 10*3/uL (ref 4.0–10.5)
nRBC: 0 % (ref 0.0–0.2)

## 2020-01-14 LAB — FERRITIN: Ferritin: 17 ng/mL (ref 11–307)

## 2020-01-14 NOTE — Progress Notes (Signed)
The patient c/o diarrhea but she states that is nothing new. The patient Name and DOB has been verified today.

## 2020-01-15 ENCOUNTER — Inpatient Hospital Stay: Payer: Medicare Other

## 2020-01-15 ENCOUNTER — Telehealth: Payer: Self-pay | Admitting: *Deleted

## 2020-01-15 ENCOUNTER — Inpatient Hospital Stay: Payer: Medicare Other | Admitting: Hematology and Oncology

## 2020-01-15 NOTE — Telephone Encounter (Signed)
Left message for patient to notify them that it is time to schedule annual low dose lung cancer screening CT scan. Instructed patient to call back to verify information prior to the scan being scheduled.  

## 2020-01-16 NOTE — Progress Notes (Signed)
Confirmed Name and DOB. Denies any concerns.  

## 2020-01-19 ENCOUNTER — Other Ambulatory Visit: Payer: Self-pay

## 2020-01-19 ENCOUNTER — Ambulatory Visit: Payer: Medicare Other

## 2020-01-19 ENCOUNTER — Inpatient Hospital Stay: Payer: Medicare Other | Attending: Hematology and Oncology | Admitting: Hematology and Oncology

## 2020-01-19 ENCOUNTER — Encounter: Payer: Self-pay | Admitting: Hematology and Oncology

## 2020-01-19 ENCOUNTER — Inpatient Hospital Stay: Payer: Medicare Other

## 2020-01-19 VITALS — BP 140/78 | HR 75 | Temp 97.4°F | Resp 18

## 2020-01-19 VITALS — BP 114/80 | HR 88 | Temp 96.9°F | Resp 18 | Wt 132.7 lb

## 2020-01-19 DIAGNOSIS — R12 Heartburn: Secondary | ICD-10-CM | POA: Diagnosis not present

## 2020-01-19 DIAGNOSIS — F17210 Nicotine dependence, cigarettes, uncomplicated: Secondary | ICD-10-CM | POA: Diagnosis not present

## 2020-01-19 DIAGNOSIS — E785 Hyperlipidemia, unspecified: Secondary | ICD-10-CM | POA: Insufficient documentation

## 2020-01-19 DIAGNOSIS — I10 Essential (primary) hypertension: Secondary | ICD-10-CM | POA: Diagnosis not present

## 2020-01-19 DIAGNOSIS — Z79899 Other long term (current) drug therapy: Secondary | ICD-10-CM | POA: Insufficient documentation

## 2020-01-19 DIAGNOSIS — Z803 Family history of malignant neoplasm of breast: Secondary | ICD-10-CM | POA: Diagnosis not present

## 2020-01-19 DIAGNOSIS — D500 Iron deficiency anemia secondary to blood loss (chronic): Secondary | ICD-10-CM | POA: Diagnosis not present

## 2020-01-19 DIAGNOSIS — K219 Gastro-esophageal reflux disease without esophagitis: Secondary | ICD-10-CM | POA: Diagnosis not present

## 2020-01-19 DIAGNOSIS — R197 Diarrhea, unspecified: Secondary | ICD-10-CM | POA: Diagnosis not present

## 2020-01-19 DIAGNOSIS — J449 Chronic obstructive pulmonary disease, unspecified: Secondary | ICD-10-CM | POA: Diagnosis not present

## 2020-01-19 DIAGNOSIS — Z8711 Personal history of peptic ulcer disease: Secondary | ICD-10-CM | POA: Diagnosis not present

## 2020-01-19 DIAGNOSIS — E538 Deficiency of other specified B group vitamins: Secondary | ICD-10-CM | POA: Diagnosis not present

## 2020-01-19 DIAGNOSIS — Z8719 Personal history of other diseases of the digestive system: Secondary | ICD-10-CM | POA: Diagnosis not present

## 2020-01-19 MED ORDER — IRON SUCROSE 20 MG/ML IV SOLN
200.0000 mg | Freq: Once | INTRAVENOUS | Status: AC
  Administered 2020-01-19: 11:00:00 200 mg via INTRAVENOUS

## 2020-01-19 MED ORDER — SODIUM CHLORIDE 0.9 % IV SOLN
Freq: Once | INTRAVENOUS | Status: AC
  Filled 2020-01-19: qty 250

## 2020-01-19 MED ORDER — SODIUM CHLORIDE 0.9 % IV SOLN: 200.0000 mg | Freq: Once | INTRAVENOUS | Status: DC

## 2020-01-19 NOTE — Progress Notes (Signed)
This encounter was created in error - please disregard.

## 2020-01-19 NOTE — Progress Notes (Signed)
[facility]  [address] [place], [place] [address] Phone: [phone]  Fax: [phone]   Clinic Day:  01/19/2020  Referring physician: Juline Patch, MD  Chief Complaint: Angelica Walker is a 79 y.o. female with iron deficiency anemia who is seen for 6 month assessment.    HPI: The patient was last seen in the hematology clinic on 07/17/2019 for new patient assessment.   She was noted to have a history of gastric ulcers, gastritis, and angioectasias.  She is intolerant of oral iron.  Symptomatically, she noted "good and bad days".  Weight had been stable x 2 years.  Hematocrit was 37.2, hemoglobin 12.1, and MCV 85.5.  Ferritin was 27.  She received Venofer x 1.  Labs on 01/14/2020 revealed a hematocrit 33.9, hemoglobin 11.1, MCV 87.8, platelets 306,000, WBC 7900 (ANC 4000).  Ferritin 17.  During the interim, she has felt good. She states that she felt better after receiving IV iron.  She rates her energy level about the same as it was last time. She believes after she received the iron, her energy went up and then leveled off.   She still has diarrhea and occasional heartburn. She denies having diabetes.   Bilateral mammogram on 10/01/2019 showed no mammographic evidence of malignancy.    Past Medical History:  Diagnosis Date  . COPD (chronic obstructive pulmonary disease) ([place])   . GERD (gastroesophageal reflux disease)   . Hyperlipidemia   . Hypertension   . Serum lipids high     Past Surgical History:  Procedure Laterality Date  . COLONOSCOPY WITH PROPOFOL N/A 12/07/2017   Procedure: COLONOSCOPY WITH PROPOFOL;  Surgeon: Lollie Sails, MD;  Location: [facility] ENDOSCOPY;  Service: Endoscopy;  Laterality: N/A;  . ESOPHAGOGASTRODUODENOSCOPY (EGD) WITH PROPOFOL N/A 12/07/2017   Procedure: ESOPHAGOGASTRODUODENOSCOPY (EGD) WITH PROPOFOL;  Surgeon: Lollie Sails, MD;  Location: [facility] ENDOSCOPY;  Service: Endoscopy;  Laterality: N/A;  .  ESOPHAGOGASTRODUODENOSCOPY (EGD) WITH PROPOFOL N/A 03/21/2018   Procedure: ESOPHAGOGASTRODUODENOSCOPY (EGD) WITH PROPOFOL;  Surgeon: Lollie Sails, MD;  Location: [facility] ENDOSCOPY;  Service: Endoscopy;  Laterality: N/A;  . ESOPHAGOGASTRODUODENOSCOPY (EGD) WITH PROPOFOL N/A 07/22/2018   Procedure: ESOPHAGOGASTRODUODENOSCOPY (EGD) WITH PROPOFOL;  Surgeon: Lollie Sails, MD;  Location: [facility] ENDOSCOPY;  Service: Endoscopy;  Laterality: N/A;    Family History  Problem Relation Age of Onset  . Breast cancer Maternal Aunt     Social History:  reports that she has been smoking cigarettes. She has a 54.00 pack-year smoking history. She has never used smokeless tobacco. She reports that she does not drink alcohol or use drugs. She is smoking 1 pack per day for 20 years. She has one child. She used to work in [employer] for 27 years; she is retired. The patient is alone today.  Allergies:  Allergies  Allergen Reactions  . Simvastatin Other (See Comments)    Current Medications: Current Outpatient Medications  Medication Sig Dispense Refill  . acetaminophen (TYLENOL) 500 MG tablet Take 1,000 mg by mouth 2 (two) times daily as needed.    . Ascorbic Acid (VITAMIN C) 100 MG tablet Take 100 mg by mouth daily.    . Calcium Carbonate-Vitamin D 600-200 MG-UNIT TABS Take by mouth.    . Cyanocobalamin (B-12) 1000 MCG TABS Take 1 tablet by mouth daily. 30 tablet   . gabapentin (NEURONTIN) 100 MG capsule Take 1 capsule (100 mg total) by mouth at bedtime. 90 capsule 1  . lisinopril-hydrochlorothiazide (ZESTORETIC) 20-12.5 MG tablet  Take 1 tablet by mouth daily. 90 tablet 1  . pantoprazole (PROTONIX) 40 MG tablet Take 1 tablet (40 mg total) by mouth daily. [place] 90 tablet 1   No current facility-administered medications for this visit.    Review of Systems  Constitutional: Negative for chills, fever, malaise/fatigue and weight loss (up 7 lbs).       Feels "good".  Energy level is the same.  HENT:  Negative for congestion, ear pain, hearing loss, nosebleeds, sinus pain, sore throat and tinnitus.        Rhinorrhea.  Eyes: Negative.  Negative for blurred vision and double vision.  Respiratory: Negative for cough and hemoptysis.        COPD.  Cardiovascular: Negative.  Negative for chest pain, palpitations and leg swelling.  Gastrointestinal: Positive for diarrhea. Negative for abdominal pain, blood in stool, constipation, heartburn (occasional), melena, nausea and vomiting.       Gastric ulcer 03/2018.  Better appetite.  Genitourinary: Negative.  Negative for dysuria, frequency, hematuria and urgency.  Musculoskeletal: Positive for back pain (degenerative disc disease, intermittent). Negative for joint pain, myalgias and neck pain.  Skin: Negative.  Negative for rash.  Neurological: Negative for dizziness, tingling, sensory change, weakness and headaches.       Peripheral neuropathy (left hand).  Endo/Heme/Allergies: Negative.  Does not bruise/bleed easily.  Psychiatric/Behavioral: Negative.  Negative for depression and memory loss. The patient is not nervous/anxious and does not have insomnia.   All other systems reviewed and are negative.  Performance status (ECOG):  1  Vitals Blood pressure 114/80, pulse 88, temperature (!) 96.9 F (36.1 C), temperature source Tympanic, resp. rate 18, weight 132 lb 11.5 oz (60.2 kg), SpO2 100 %.   Physical Exam  Constitutional: She is oriented to person, place, and time. She appears well-developed and well-nourished. No distress.  HENT:  Head: Normocephalic and atraumatic.  Mouth/Throat: Oropharynx is clear and moist. No oropharyngeal exudate.  Slightly curly brown hair.  Mask.  Eyes: Pupils are equal, round, and reactive to light. Conjunctivae and EOM are normal. No scleral icterus.  Glasses.  Blue eyes.  Neck: No JVD present.  Cardiovascular: Normal rate, regular rhythm and normal heart sounds.  No murmur heard. Pulmonary/Chest: Effort  normal and breath sounds normal. No respiratory distress. She has no wheezes. She has no rales. She exhibits no tenderness.  Abdominal: Soft. Bowel sounds are normal. She exhibits no distension and no mass. There is no abdominal tenderness. There is no rebound and no guarding.  Musculoskeletal:        General: No tenderness or edema. Normal range of motion.     Cervical back: Normal range of motion and neck supple.     Comments: Arthritis changes in hands.  Lymphadenopathy:    She has no cervical adenopathy.    She has no axillary adenopathy.       Right: No supraclavicular adenopathy present.       Left: No supraclavicular adenopathy present.  Neurological: She is alert and oriented to person, place, and time. She has normal reflexes.  Skin: Skin is warm and dry. No rash noted. She is not diaphoretic. No cyanosis or erythema. No pallor.  Psychiatric: She has a normal mood and affect. Her behavior is normal. Judgment and thought content normal.  Nursing note and vitals reviewed.   No visits with results within 3 Day(s) from this visit.  Latest known visit with results is:  Appointment on 01/14/2020  Component Date Value Ref Range Status  .  Ferritin 01/14/2020 17  11 - 307 ng/mL Final   Performed at [facility], [address] [place], [place] [address]  . WBC 01/14/2020 7.9  4.0 - 10.5 K/uL Final  . RBC 01/14/2020 3.86* 3.87 - 5.11 MIL/uL Final  . Hemoglobin 01/14/2020 11.1* 12.0 - 15.0 g/dL Final  . HCT 01/14/2020 33.9* 36.0 - 46.0 % Final  . MCV 01/14/2020 87.8  80.0 - 100.0 fL Final  . MCH 01/14/2020 28.8  26.0 - 34.0 pg Final  . MCHC 01/14/2020 32.7  30.0 - 36.0 g/dL Final  . RDW 01/14/2020 14.8  11.5 - 15.5 % Final  . Platelets 01/14/2020 306  150 - 400 K/uL Final  . nRBC 01/14/2020 0.0  0.0 - 0.2 % Final  . Neutrophils Relative % 01/14/2020 51  % Final  . Neutro Abs 01/14/2020 4.0  1.7 - 7.7 K/uL Final  . Lymphocytes Relative 01/14/2020 35  % Final  . Lymphs Abs  01/14/2020 2.8  0.7 - 4.0 K/uL Final  . Monocytes Relative 01/14/2020 11  % Final  . Monocytes Absolute 01/14/2020 0.9  0.1 - 1.0 K/uL Final  . Eosinophils Relative 01/14/2020 2  % Final  . Eosinophils Absolute 01/14/2020 0.2  0.0 - 0.5 K/uL Final  . Basophils Relative 01/14/2020 1  % Final  . Basophils Absolute 01/14/2020 0.1  0.0 - 0.1 K/uL Final  . Immature Granulocytes 01/14/2020 0  % Final  . Abs Immature Granulocytes 01/14/2020 0.02  0.00 - 0.07 K/uL Final   Performed at [facility], [address] [place], [place] [address]    Assessment:  Angelica Walker is a 78 y.o. female with iron deficiency anemia secondary to gastric ulcer and GI bleed in 03/2018.  She is intolerant of oral iron.  She has received Venofer weekly x 4 (10/08/2018 -10/29/2018) and 07/17/2019.   Ferritin has been followed: 9 on 01/18/2018, 11 on 03/18/2018, 13 on 09/30/2018, 27 on 07/14/2019, and 17 on 01/14/2020.  EGD on 12/07/2017 revealed  Z-line variable.  There was a non-obstructing non-bleeding gastric ulcer with no stigmata of bleeding. The duodenum was normal. Biopsies showed chronic nonspecific gastritis.  There was no H. pylori, dysplasia or malignancy. EGD on 03/21/2018 revealed Z-line variable.  There was erosive gastritis with healing ulceration in the stomach.  There was no H. pylori, dysplasia or malignancy.  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 EGD on 07/22/2018 revealed Z-line variable.  There was non-bleeding gastric ulcer with no stigmata of bleeding.  Acquired deformity in the gastric body (posterior wall).  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     Colonoscopy on 12/07/2017 revealed three 1 to 2 mm polyps in the rectum, removed. Diverticulosis in the sigmoid colon. Tortuous colon. 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  Pathology revealed 3 hyperplastic polyps.  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       She has a smoking history.  Low dose chest CT on 01/21/2019 was Lung-RADS 1.  Symptomatically, she feels "good".  Exam is stable.  Hemoglobin is 11.1.  Ferritin is 17.  Plan: 1.   Review labs from 01/14/2020. 2.   Iron deficiency anemia             She has a history of gastric ulcers, gastritis, and angiectasias.  She denies any bleeding.  Hematocrit 33.9.  Hemoglobin 11.1.  MCV 87.8.    Ferritin 17.                Ferritin has dropped again.  She received Venofer after last visit.  Venofer if ferritin > 30.             Venofer today and weekly x 2 (total 3). 3.   Health maintenance  Patient has a smoking history.  She underwent low dose chest CT in 01/2019.  RN to call [facility]- re low dose chest CT (patent agrees). 4.   Please schedule follow-up with Dr Alice Reichert (well known to [place]). 5.   RTC in 6 weeks for labs (CBC, ferritin). 6.   RTC in 3 months for MD assess, labs (CBC with diff, ferritin- day before), and +/- Venofer.  I discussed the assessment and treatment plan with the patient.  The patient was provided an opportunity to ask questions and all were answered.  The patient agreed with the plan and demonstrated an understanding of the instructions.  The patient was advised to call back if the symptoms worsen or if the condition fails to improve as anticipated.   Lequita Asal, MD, PhD    01/19/2020, 10:52 AM   I, Samul Dada, am acting as a scribe for Lequita Asal, MD.  I, Jess[place] Mike Gip, MD, have reviewed the above documentation for accuracy and completeness, and I agree with the above.

## 2020-01-19 NOTE — Patient Instructions (Signed)

## 2020-01-26 ENCOUNTER — Inpatient Hospital Stay: Payer: Medicare Other

## 2020-01-26 ENCOUNTER — Other Ambulatory Visit: Payer: Self-pay

## 2020-01-26 VITALS — BP 80/51 | HR 76 | Temp 97.2°F | Resp 18

## 2020-01-26 DIAGNOSIS — D500 Iron deficiency anemia secondary to blood loss (chronic): Secondary | ICD-10-CM | POA: Diagnosis not present

## 2020-01-26 DIAGNOSIS — J449 Chronic obstructive pulmonary disease, unspecified: Secondary | ICD-10-CM | POA: Diagnosis not present

## 2020-01-26 DIAGNOSIS — Z803 Family history of malignant neoplasm of breast: Secondary | ICD-10-CM | POA: Diagnosis not present

## 2020-01-26 DIAGNOSIS — I10 Essential (primary) hypertension: Secondary | ICD-10-CM | POA: Diagnosis not present

## 2020-01-26 DIAGNOSIS — R12 Heartburn: Secondary | ICD-10-CM | POA: Diagnosis not present

## 2020-01-26 DIAGNOSIS — Z79899 Other long term (current) drug therapy: Secondary | ICD-10-CM | POA: Diagnosis not present

## 2020-01-26 DIAGNOSIS — R197 Diarrhea, unspecified: Secondary | ICD-10-CM | POA: Diagnosis not present

## 2020-01-26 DIAGNOSIS — E785 Hyperlipidemia, unspecified: Secondary | ICD-10-CM | POA: Diagnosis not present

## 2020-01-26 DIAGNOSIS — Z8719 Personal history of other diseases of the digestive system: Secondary | ICD-10-CM | POA: Diagnosis not present

## 2020-01-26 DIAGNOSIS — Z8711 Personal history of peptic ulcer disease: Secondary | ICD-10-CM | POA: Diagnosis not present

## 2020-01-26 DIAGNOSIS — K219 Gastro-esophageal reflux disease without esophagitis: Secondary | ICD-10-CM | POA: Diagnosis not present

## 2020-01-26 MED ORDER — IRON SUCROSE 20 MG/ML IV SOLN
200.0000 mg | Freq: Once | INTRAVENOUS | Status: AC
  Administered 2020-01-26: 200 mg via INTRAVENOUS

## 2020-01-26 MED ORDER — SODIUM CHLORIDE 0.9 % IV SOLN
Freq: Once | INTRAVENOUS | Status: AC
  Filled 2020-01-26: qty 250

## 2020-01-26 MED ORDER — SODIUM CHLORIDE 0.9 % IV SOLN: 200.0000 mg | Freq: Once | INTRAVENOUS | Status: DC

## 2020-01-26 NOTE — Patient Instructions (Signed)

## 2020-02-02 ENCOUNTER — Other Ambulatory Visit: Payer: Self-pay

## 2020-02-02 ENCOUNTER — Inpatient Hospital Stay: Payer: Medicare Other

## 2020-02-02 VITALS — BP 106/70 | HR 77 | Temp 98.0°F | Resp 18

## 2020-02-02 DIAGNOSIS — Z79899 Other long term (current) drug therapy: Secondary | ICD-10-CM | POA: Diagnosis not present

## 2020-02-02 DIAGNOSIS — Z8711 Personal history of peptic ulcer disease: Secondary | ICD-10-CM | POA: Diagnosis not present

## 2020-02-02 DIAGNOSIS — D500 Iron deficiency anemia secondary to blood loss (chronic): Secondary | ICD-10-CM | POA: Diagnosis not present

## 2020-02-02 DIAGNOSIS — K219 Gastro-esophageal reflux disease without esophagitis: Secondary | ICD-10-CM | POA: Diagnosis not present

## 2020-02-02 DIAGNOSIS — Z8719 Personal history of other diseases of the digestive system: Secondary | ICD-10-CM | POA: Diagnosis not present

## 2020-02-02 DIAGNOSIS — E785 Hyperlipidemia, unspecified: Secondary | ICD-10-CM | POA: Diagnosis not present

## 2020-02-02 DIAGNOSIS — R12 Heartburn: Secondary | ICD-10-CM | POA: Diagnosis not present

## 2020-02-02 DIAGNOSIS — R197 Diarrhea, unspecified: Secondary | ICD-10-CM | POA: Diagnosis not present

## 2020-02-02 DIAGNOSIS — Z803 Family history of malignant neoplasm of breast: Secondary | ICD-10-CM | POA: Diagnosis not present

## 2020-02-02 DIAGNOSIS — J449 Chronic obstructive pulmonary disease, unspecified: Secondary | ICD-10-CM | POA: Diagnosis not present

## 2020-02-02 DIAGNOSIS — I10 Essential (primary) hypertension: Secondary | ICD-10-CM | POA: Diagnosis not present

## 2020-02-02 MED ORDER — SODIUM CHLORIDE 0.9 % IV SOLN
Freq: Once | INTRAVENOUS | Status: AC
  Filled 2020-02-02: qty 250

## 2020-02-02 MED ORDER — SODIUM CHLORIDE 0.9 % IV SOLN: 200.0000 mg | Freq: Once | INTRAVENOUS | Status: DC

## 2020-02-02 MED ORDER — IRON SUCROSE 20 MG/ML IV SOLN
200.0000 mg | Freq: Once | INTRAVENOUS | Status: AC
  Administered 2020-02-02: 200 mg via INTRAVENOUS

## 2020-02-02 NOTE — Patient Instructions (Signed)

## 2020-02-05 ENCOUNTER — Telehealth: Payer: Self-pay | Admitting: *Deleted

## 2020-02-05 NOTE — Telephone Encounter (Signed)
Contacted regarding scheduling lung screening scan for this year. Patient reports she would like to wait and consider lung screening in April.

## 2020-03-01 ENCOUNTER — Inpatient Hospital Stay: Payer: Medicare Other | Attending: Hematology and Oncology

## 2020-03-01 ENCOUNTER — Other Ambulatory Visit: Payer: Self-pay

## 2020-03-01 DIAGNOSIS — D500 Iron deficiency anemia secondary to blood loss (chronic): Secondary | ICD-10-CM | POA: Insufficient documentation

## 2020-03-01 DIAGNOSIS — K2970 Gastritis, unspecified, without bleeding: Secondary | ICD-10-CM | POA: Diagnosis not present

## 2020-03-01 DIAGNOSIS — E538 Deficiency of other specified B group vitamins: Secondary | ICD-10-CM

## 2020-03-01 LAB — CBC
HCT: 38.8 % (ref 36.0–46.0)
Hemoglobin: 12.7 g/dL (ref 12.0–15.0)
MCH: 29.7 pg (ref 26.0–34.0)
MCHC: 32.7 g/dL (ref 30.0–36.0)
MCV: 90.9 fL (ref 80.0–100.0)
Platelets: 309 10*3/uL (ref 150–400)
RBC: 4.27 MIL/uL (ref 3.87–5.11)
RDW: 17.6 % — ABNORMAL HIGH (ref 11.5–15.5)
WBC: 7.2 10*3/uL (ref 4.0–10.5)
nRBC: 0 % (ref 0.0–0.2)

## 2020-03-01 LAB — FERRITIN: Ferritin: 83 ng/mL (ref 11–307)

## 2020-03-16 ENCOUNTER — Other Ambulatory Visit: Payer: Self-pay | Admitting: Family Medicine

## 2020-03-16 ENCOUNTER — Telehealth: Payer: Self-pay

## 2020-03-16 DIAGNOSIS — G2581 Restless legs syndrome: Secondary | ICD-10-CM

## 2020-03-16 NOTE — Telephone Encounter (Signed)
Message left notifying patient that it is time to schedule the low dose lung cancer screening CT scan.  Instructed patient to return call to Shawn Perkins at [phone] to verify information prior to CT scan being scheduled.    

## 2020-03-25 ENCOUNTER — Telehealth: Payer: Self-pay

## 2020-03-25 NOTE — Telephone Encounter (Signed)
Patient has been notified that the low dose lung cancer screening CT scan is due currently or will be in near future.  Confirmed that patient is within the appropriate age range and asymptomatic, (no signs or symptoms of lung cancer).  Patient denies illness that would prevent curative treatment for lung cancer if found.  Patient is agreeable for CT scan being scheduled.    Verified smoking history (current smoker, with 55 year 1 ppd history).   2nd COVID vaccine on 03/12/2020.  Patient would like to go for CT screening but her next couple of weeks are busy with other appointments.  She would like me to call her back in a few weeks to get scheduled.

## 2020-04-02 ENCOUNTER — Other Ambulatory Visit: Payer: Self-pay | Admitting: Family Medicine

## 2020-04-02 DIAGNOSIS — I10 Essential (primary) hypertension: Secondary | ICD-10-CM

## 2020-04-02 DIAGNOSIS — K259 Gastric ulcer, unspecified as acute or chronic, without hemorrhage or perforation: Secondary | ICD-10-CM

## 2020-04-02 DIAGNOSIS — G2581 Restless legs syndrome: Secondary | ICD-10-CM

## 2020-04-07 ENCOUNTER — Other Ambulatory Visit: Payer: Self-pay

## 2020-04-07 ENCOUNTER — Encounter: Payer: Self-pay | Admitting: Family Medicine

## 2020-04-07 ENCOUNTER — Ambulatory Visit (INDEPENDENT_AMBULATORY_CARE_PROVIDER_SITE_OTHER): Payer: Medicare Other | Admitting: Family Medicine

## 2020-04-07 VITALS — BP 120/60 | HR 72 | Ht 62.0 in | Wt 129.0 lb

## 2020-04-07 DIAGNOSIS — K259 Gastric ulcer, unspecified as acute or chronic, without hemorrhage or perforation: Secondary | ICD-10-CM

## 2020-04-07 DIAGNOSIS — E785 Hyperlipidemia, unspecified: Secondary | ICD-10-CM | POA: Diagnosis not present

## 2020-04-07 DIAGNOSIS — I10 Essential (primary) hypertension: Secondary | ICD-10-CM

## 2020-04-07 DIAGNOSIS — R7303 Prediabetes: Secondary | ICD-10-CM

## 2020-04-07 DIAGNOSIS — F17200 Nicotine dependence, unspecified, uncomplicated: Secondary | ICD-10-CM | POA: Diagnosis not present

## 2020-04-07 DIAGNOSIS — G2581 Restless legs syndrome: Secondary | ICD-10-CM

## 2020-04-07 MED ORDER — PANTOPRAZOLE SODIUM 40 MG PO TBEC: DELAYED_RELEASE_TABLET | ORAL | 1 refills | Status: DC

## 2020-04-07 MED ORDER — GABAPENTIN 100 MG PO CAPS: ORAL_CAPSULE | ORAL | 1 refills | Status: DC

## 2020-04-07 MED ORDER — LISINOPRIL-HYDROCHLOROTHIAZIDE 20-12.5 MG PO TABS: ORAL_TABLET | ORAL | 1 refills | Status: DC

## 2020-04-07 NOTE — Patient Instructions (Signed)

## 2020-04-07 NOTE — Progress Notes (Signed)
Date:  04/07/2020   Name:  Angelica Walker   DOB:  [birth year]   MRN:  [id number]   Chief Complaint: Gastroesophageal Reflux, Hypertension, and restless leg  Gastroesophageal Reflux She reports no abdominal pain, no belching, no chest pain, no choking, no coughing, no dysphagia, no early satiety, no globus sensation, no heartburn, no hoarse voice, no nausea, no sore throat, no stridor, no tooth decay, no water brash or no wheezing. restless legs. This is a new problem. The current episode started more than 1 year ago. The problem occurs frequently. The problem has been gradually improving. Nothing aggravates the symptoms. Pertinent negatives include no anemia, fatigue, melena, muscle weakness, orthopnea or weight loss. She has tried a PPI for the symptoms. The treatment provided mild relief.  Hypertension This is a chronic problem. The current episode started more than 1 year ago. The problem is controlled. Pertinent negatives include no anxiety, blurred vision, chest pain, headaches, malaise/fatigue, neck pain, orthopnea, palpitations, peripheral edema, PND, shortness of breath or sweats. There are no known risk factors for coronary artery disease. Past treatments include ACE inhibitors and diuretics. The current treatment provides moderate improvement. There are no compliance problems.  There is no history of angina, kidney disease, CAD/MI, CVA, heart failure, left ventricular hypertrophy, PVD or retinopathy. There is no history of chronic renal disease, a hypertension causing med or renovascular disease.  Neurologic Problem The patient's pertinent negatives include no altered mental status, clumsiness, focal sensory loss, focal weakness, loss of balance, memory loss, near-syncope, slurred speech, syncope, visual change or weakness. Primary symptoms comment: restless legs. This is a chronic problem. The current episode started more than 1 year ago. The neurological problem developed gradually. The  problem has been gradually worsening since onset. There was no focality noted. Pertinent negatives include no abdominal pain, back pain, chest pain, dizziness, fatigue, fever, headaches, light-headedness, nausea, neck pain, palpitations, shortness of breath, vertigo or vomiting. Past treatments include medication. The treatment provided moderate relief.  Diabetes She presents for her follow-up diabetic visit. She has type 2 diabetes mellitus. Her disease course has been stable. There are no hypoglycemic associated symptoms. Pertinent negatives for hypoglycemia include no dizziness, headaches, nervousness/anxiousness or sweats. Pertinent negatives for diabetes include no blurred vision, no chest pain, no fatigue, no foot paresthesias, no foot ulcerations, no polydipsia, no polyphagia, no polyuria, no visual change, no weakness and no weight loss. There are no hypoglycemic complications. Symptoms are improving. There are no diabetic complications. Pertinent negatives for diabetic complications include no CVA, nephropathy, PVD or retinopathy. Risk factors for coronary artery disease include dyslipidemia, hypertension and diabetes mellitus. Current diabetic treatment includes diet.    Lab Results  Component Value Date   CREATININE 0.69 09/18/2019   BUN 10 09/18/2019   NA 140 09/18/2019   K 4.3 09/18/2019   CL 101 09/18/2019   CO2 27 09/18/2019   Lab Results  Component Value Date   CHOL 227 (H) 09/18/2019   HDL 49 09/18/2019   LDLCALC 154 (H) 09/18/2019   TRIG 133 09/18/2019   CHOLHDL 3.5 12/19/2017   Lab Results  Component Value Date   TSH 1.900 12/19/2017   Lab Results  Component Value Date   HGBA1C 5.8 (H) 09/18/2019   Lab Results  Component Value Date   WBC 7.2 03/01/2020   HGB 12.7 03/01/2020   HCT 38.8 03/01/2020   MCV 90.9 03/01/2020   PLT 309 03/01/2020   Lab Results  Component Value Date  ALT 8 12/19/2017   AST 18 12/19/2017   ALKPHOS 87 12/19/2017   BILITOT 0.4  12/19/2017     Review of Systems  Constitutional: Negative.  Negative for chills, fatigue, fever, malaise/fatigue, unexpected weight change and weight loss.  HENT: Negative for congestion, ear discharge, ear pain, hoarse voice, rhinorrhea, sinus pressure, sneezing and sore throat.   Eyes: Negative for blurred vision, photophobia, pain, discharge, redness and itching.  Respiratory: Negative for cough, choking, shortness of breath, wheezing and stridor.   Cardiovascular: Negative for chest pain, palpitations, orthopnea, PND and near-syncope.  Gastrointestinal: Negative for abdominal pain, blood in stool, constipation, diarrhea, dysphagia, heartburn, melena, nausea and vomiting.  Endocrine: Negative for cold intolerance, heat intolerance, polydipsia, polyphagia and polyuria.  Genitourinary: Negative for dysuria, flank pain, frequency, hematuria, menstrual problem, pelvic pain, urgency, vaginal bleeding and vaginal discharge.  Musculoskeletal: Negative for arthralgias, back pain, myalgias, muscle weakness and neck pain.  Skin: Negative for rash.  Allergic/Immunologic: Negative for environmental allergies and food allergies.  Neurological: Negative for dizziness, vertigo, focal weakness, syncope, weakness, light-headedness, numbness, headaches and loss of balance.  Hematological: Negative for adenopathy. Does not bruise/bleed easily.  Psychiatric/Behavioral: Negative for dysphoric mood and memory loss. The patient is not nervous/anxious.     Patient Active Problem List   Diagnosis Date Noted  . Iron deficiency anemia due to chronic blood loss 07/02/2018  . Smoker 03/18/2018  . RLS (restless legs syndrome) 01/18/2018  . Anemia due to blood loss 12/20/2017  . Prediabetes 12/19/2017  . Hyperlipidemia, unspecified 12/19/2017  . Hypertension, essential, benign 12/19/2017  . B12 deficiency 12/19/2017  . DDD (degenerative disc disease), lumbar 12/19/2017  . Multiple gastric ulcers 12/19/2017  .  COPD (chronic obstructive pulmonary disease) ([place]) 12/21/2014    Allergies  Allergen Reactions  . Simvastatin Other (See Comments)    Past Surgical History:  Procedure Laterality Date  . COLONOSCOPY WITH PROPOFOL N/A 12/07/2017   Procedure: COLONOSCOPY WITH PROPOFOL;  Surgeon: Lollie Sails, MD;  Location: [facility] ENDOSCOPY;  Service: Endoscopy;  Laterality: N/A;  . ESOPHAGOGASTRODUODENOSCOPY (EGD) WITH PROPOFOL N/A 12/07/2017   Procedure: ESOPHAGOGASTRODUODENOSCOPY (EGD) WITH PROPOFOL;  Surgeon: Lollie Sails, MD;  Location: [facility] ENDOSCOPY;  Service: Endoscopy;  Laterality: N/A;  . ESOPHAGOGASTRODUODENOSCOPY (EGD) WITH PROPOFOL N/A 03/21/2018   Procedure: ESOPHAGOGASTRODUODENOSCOPY (EGD) WITH PROPOFOL;  Surgeon: Lollie Sails, MD;  Location[facility] ENDOSCOPY;  Service: Endoscopy;  Laterality: N/A;  . ESOPHAGOGASTRODUODENOSCOPY (EGD) WITH PROPOFOL N/A 07/22/2018   Procedure: ESOPHAGOGASTRODUODENOSCOPY (EGD) WITH PROPOFOL;  Surgeon: Lollie Sails, MD;  Location: [facility] ENDOSCOPY;  Service: Endoscopy;  Laterality: N/A;    Social History   Tobacco Use  . Smoking status: Current Every Day Smoker    Packs/day: 1.00    Years: 54.00    Pack years: 54.00    Types: Cigarettes  . Smokeless tobacco: Never Used  . Tobacco comment: patches and pills discussed  Substance Use Topics  . Alcohol use: No  . Drug use: No     Medication list has been reviewed and updated.  Current Meds  Medication Sig  . acetaminophen (TYLENOL) 500 MG tablet Take 1,000 mg by mouth 2 (two) times daily as needed.  . Ascorbic Acid (VITAMIN C) 100 MG tablet Take 100 mg by mouth daily.  . Calcium Carbonate-Vitamin D 600-200 MG-UNIT TABS Take by mouth.  . Cyanocobalamin (B-12) 1000 MCG TABS Take 1 tablet by mouth daily.  Marland Kitchen gabapentin (NEURONTIN) 100 MG capsule TAKE (1) CAPSULE BY MOUTH AT BEDTIME  .  lisinopril-hydrochlorothiazide (ZESTORETIC) 20-12.5 MG tablet TAKE (1) TABLET BY MOUTH EVERY DAY  .  pantoprazole (PROTONIX) 40 MG tablet TAKE (1) TABLET BY MOUTH EVERY DAY    PHQ 2/9 Scores 04/07/2020 03/19/2019 03/13/2018 12/19/2017  PHQ - 2 Score 0 0 0 0  PHQ- 9 Score 0 1 0 -    BP Readings from Last 3 Encounters:  04/07/20 120/60  02/02/20 106/70  01/26/20 (!) 80/51    Physical Exam Vitals and nursing note reviewed.  Constitutional:      Appearance: She is well-developed.  HENT:     Head: Normocephalic.     Right Ear: Tympanic membrane, ear canal and external ear normal.     Left Ear: Tympanic membrane, ear canal and external ear normal.     Mouth/Throat:     Mouth: Mucous membranes are moist.  Eyes:     General: Lids are everted, no foreign bodies appreciated. No scleral icterus.       Left eye: No foreign body or hordeolum.     Conjunctiva/sclera: Conjunctivae normal.     Right eye: Right conjunctiva is not injected.     Left eye: Left conjunctiva is not injected.     Pupils: Pupils are equal, round, and reactive to light.  Neck:     Thyroid: No thyromegaly.     Vascular: No JVD.     Trachea: No tracheal deviation.  Cardiovascular:     Rate and Rhythm: Normal rate and regular rhythm.     Heart sounds: Normal heart sounds. No murmur. No friction rub. No gallop.   Pulmonary:     Effort: Pulmonary effort is normal. No respiratory distress.     Breath sounds: Normal breath sounds. No wheezing or rales.  Abdominal:     General: Bowel sounds are normal.     Palpations: Abdomen is soft. There is no mass.     Tenderness: There is no abdominal tenderness. There is no guarding or rebound.  Musculoskeletal:        General: No tenderness. Normal range of motion.     Cervical back: Normal range of motion and neck supple.  Lymphadenopathy:     Cervical: No cervical adenopathy.  Skin:    General: Skin is warm.     Findings: No rash.  Neurological:     Mental Status: She is alert and oriented to person, place, and time.     Cranial Nerves: No cranial nerve deficit.     Deep  Tendon Reflexes: Reflexes normal.  Psychiatric:        Mood and Affect: Mood is not anxious or depressed.     Wt Readings from Last 3 Encounters:  04/07/20 129 lb (58.5 kg)  01/19/20 132 lb 11.5 oz (60.2 kg)  09/18/19 126 lb (57.2 kg)    BP 120/60   Pulse 72   Ht 5\' 2"  (1.575 m)   Wt 129 lb (58.5 kg)   BMI 23.59 kg/m   Assessment and Plan:  1. RLS (restless legs syndrome) Chronic.  Stable.  Controlled.  Patient continues gabapentin 100 mg nightly for restless leg which is allowing patient to to sleep without interference. - gabapentin (NEURONTIN) 100 MG capsule; TAKE (1) CAPSULE BY MOUTH AT BEDTIME  Dispense: 90 capsule; Refill: 1  2. Hypertension, essential, benign Chronic.  Controlled.  Stable.  Continue lisinopril hydrochlorothiazide 20-12 0.5 once a day.  Will check renal function panel. - lisinopril-hydrochlorothiazide (ZESTORETIC) 20-12.5 MG tablet; TAKE (1) TABLET BY MOUTH EVERY DAY  Dispense:  90 tablet; Refill: 1 - Renal Function Panel  3. Multiple gastric ulcers Chronic.  Controlled.  Stable.  Continue pantoprazole 40 mg once a day. - pantoprazole (PROTONIX) 40 MG tablet; TAKE (1) TABLET BY MOUTH EVERY DAY  Dispense: 90 tablet; Refill: 1  4. Smoker Patient has been advised of the health risks of smoking and counseled concerning cessation of tobacco products. I spent over 3 minutes for discussion and to answer questions.  5. Prediabetes Chronic.  Controlled.  Stable.  We will check A1c and patient has been discussed about concerns of diabetes and has not related any symptoms of polyuria polydipsia.  We will also check renal function panel to assess GFR. - Renal Function Panel - Hemoglobin A1c  6. Hyperlipidemia, unspecified hyperlipidemia type Chronic.  Controlled.  Stable.  Continue dietary approach will check lipid panel for evaluation. - Lipid Panel With LDL/HDL Ratio

## 2020-04-08 LAB — LIPID PANEL WITH LDL/HDL RATIO
Cholesterol, Total: 186 mg/dL (ref 100–199)
HDL: 51 mg/dL (ref >39)
LDL Chol Calc (NIH): 113 mg/dL — ABNORMAL HIGH (ref 0–99)
LDL/HDL Ratio: 2.2 ratio (ref 0.0–3.2)
Triglycerides: 123 mg/dL (ref 0–149)
VLDL Cholesterol Cal: 22 mg/dL (ref 5–40)

## 2020-04-08 LAB — RENAL FUNCTION PANEL
Albumin: 4.4 g/dL (ref 3.7–4.7)
BUN/Creatinine Ratio: 18 (ref 12–28)
BUN: 11 mg/dL (ref 8–27)
CO2: 26 mmol/L (ref 20–29)
Calcium: 9.8 mg/dL (ref 8.7–10.3)
Chloride: 102 mmol/L (ref 96–106)
Creatinine, Ser: 0.61 mg/dL (ref 0.57–1.00)
GFR calc Af Amer: 102 mL/min/{1.73_m2} (ref >59)
GFR calc non Af Amer: 88 mL/min/{1.73_m2} (ref >59)
Glucose: 105 mg/dL — ABNORMAL HIGH (ref 65–99)
Phosphorus: 3.6 mg/dL (ref 3.0–4.3)
Potassium: 4.3 mmol/L (ref 3.5–5.2)
Sodium: 142 mmol/L (ref 134–144)

## 2020-04-08 LAB — HEMOGLOBIN A1C
Est. average glucose Bld gHb Est-mCnc: 117 mg/dL
Hgb A1c MFr Bld: 5.7 % — ABNORMAL HIGH (ref 4.8–5.6)

## 2020-04-08 NOTE — Progress Notes (Signed)
[facility]  [address] [place], [place] [address] Phone: [phone]  Fax: [phone]   Clinic Day:  04/12/2020  Referring physician: Juline Patch, MD  Chief Complaint: Angelica Walker is a 79 y.o. female with iron deficiency anemia who is seen for a 2 month assessment.    HPI: The patient was last seen in the hematology clinic on 01/19/2020. At that time, she felt "good".  Exam was stable. Hematocrit was 33.9, hemoglobin is 11.1, MCV 87.8, platelets 306,000, WBC 7,900. Ferritin was 17.   She received Venofer weekly x 3 (01/19/2020 - 02/02/2020).   CBC followed:  03/01/2020: Hematocrit 38.8, hemoglobin 12.7, MCV 90.9, platelets 309,000, WBC 7,200.  Ferritin 83.  04/09/2020: Hematocrit 40.4, hemoglobin 13.1, MCV 91.8, platelets 309,000, WBC 7,700.  Ferritin 47.   During the interim, the patient has been doing ok. She felt no difference after IV iron. Her energy is about the same. She notes some aches and pains. She had back pain. 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 She has no ulcer issues. She denies any black or bloody stool. Her weight is down 4 pounds, her goal weight is 130 pounds.    Past Medical History:  Diagnosis Date  . COPD (chronic obstructive pulmonary disease) ([place])   . GERD (gastroesophageal reflux disease)   . Hyperlipidemia   . Hypertension   . Serum lipids high     Past Surgical History:  Procedure Laterality Date  . COLONOSCOPY WITH PROPOFOL N/A 12/07/2017   Procedure: COLONOSCOPY WITH PROPOFOL;  Surgeon: Lollie Sails, MD;  Location: [facility] ENDOSCOPY;  Service: Endoscopy;  Laterality: N/A;  . ESOPHAGOGASTRODUODENOSCOPY (EGD) WITH PROPOFOL N/A 12/07/2017   Procedure: ESOPHAGOGASTRODUODENOSCOPY (EGD) WITH PROPOFOL;  Surgeon: Lollie Sails, MD;  Location: [facility] ENDOSCOPY;  Service: Endoscopy;  Laterality: N/A;  . ESOPHAGOGASTRODUODENOSCOPY (EGD) WITH PROPOFOL N/A 03/21/2018   Procedure:  ESOPHAGOGASTRODUODENOSCOPY (EGD) WITH PROPOFOL;  Surgeon: Lollie Sails, MD;  Location: [facility] ENDOSCOPY;  Service: Endoscopy;  Laterality: N/A;  . ESOPHAGOGASTRODUODENOSCOPY (EGD) WITH PROPOFOL N/A 07/22/2018   Procedure: ESOPHAGOGASTRODUODENOSCOPY (EGD) WITH PROPOFOL;  Surgeon: Lollie Sails, MD;  Location: [facility] ENDOSCOPY;  Service: Endoscopy;  Laterality: N/A;    Family History  Problem Relation Age of Onset  . Breast cancer Maternal Aunt     Social History:  reports that she has been smoking cigarettes. She has a 54.00 pack-year smoking history. She has never used smokeless tobacco. She reports that she does not drink alcohol or use drugs. She is smoking 1 pack per day for 20 years. She has one child. She used to work in [employer] for 27 years; she is retired. The patient is alone today.  Allergies:  Allergies  Allergen Reactions  . Simvastatin Other (See Comments)    Current Medications: Current Outpatient Medications  Medication Sig Dispense Refill  . acetaminophen (TYLENOL) 500 MG tablet Take 1,000 mg by mouth 2 (two) times daily as needed.    . Ascorbic Acid (VITAMIN C) 100 MG tablet Take 100 mg by mouth daily.    . Calcium Carbonate-Vitamin D 600-200 MG-UNIT TABS Take by mouth.    . Cyanocobalamin (B-12) 1000 MCG TABS Take 1 tablet by mouth daily. 30 tablet   . gabapentin (NEURONTIN) 100 MG capsule TAKE (1) CAPSULE BY MOUTH AT BEDTIME 90 capsule 1  . lisinopril-hydrochlorothiazide (ZESTORETIC) 20-12.5 MG tablet TAKE (1) TABLET BY MOUTH EVERY DAY 90 tablet 1  . pantoprazole (PROTONIX) 40 MG tablet TAKE (1) TABLET  BY MOUTH EVERY DAY 90 tablet 1   No current facility-administered medications for this visit.    Review of Systems  Constitutional: Positive for malaise/fatigue and weight loss (4 lbs). Negative for chills and fever.       Doing ok.  HENT: Negative for congestion, ear pain, hearing loss, nosebleeds, sinus pain, sore throat and tinnitus.   Eyes: Negative.   Negative for blurred vision and double vision.  Respiratory: Negative for cough and hemoptysis.        COPD.  Cardiovascular: Negative.  Negative for chest pain, palpitations and leg swelling.  Gastrointestinal: Negative for abdominal pain, blood in stool, constipation, diarrhea, heartburn (occasional), melena, nausea and vomiting.       Gastric ulcer 03/2018, no issues.  Genitourinary: Negative.  Negative for dysuria, frequency, hematuria and urgency.  Musculoskeletal: Positive for back pain (degenerative disc disease, intermittent). Negative for joint pain, myalgias and neck pain.  Skin: Negative.  Negative for rash.  Neurological: Negative for dizziness, tingling, sensory change, weakness and headaches.       Peripheral neuropathy (left hand); intermittent.  Endo/Heme/Allergies: Negative.  Does not bruise/bleed easily.  Psychiatric/Behavioral: Negative.  Negative for depression and memory loss. The patient is not nervous/anxious and does not have insomnia.   All other systems reviewed and are negative.  Performance status (ECOG):  1  Vitals Blood pressure 126/73, pulse 78, temperature (!) 96.8 F (36 C), temperature source Tympanic, resp. rate 18, weight 128 lb 6.7 oz (58.3 kg), SpO2 99 %.   Physical Exam  Constitutional: She is oriented to person, place, and time. She appears well-developed and well-nourished. No distress.  HENT:  Head: Normocephalic and atraumatic.  Mouth/Throat: Oropharynx is clear and moist. No oropharyngeal exudate.  Slightly curly brown hair.  Mask.  Eyes: Pupils are equal, round, and reactive to light. Conjunctivae and EOM are normal. No scleral icterus.  Glasses.  Blue eyes.  Neck: No JVD present.  Cardiovascular: Normal rate, regular rhythm and normal heart sounds.  No murmur heard. Pulmonary/Chest: Effort normal and breath sounds normal. No respiratory distress. She has no wheezes. She has no rales. She exhibits no tenderness.  Abdominal: Soft. Bowel  sounds are normal. She exhibits no distension and no mass. There is no abdominal tenderness. There is no rebound and no guarding.  Musculoskeletal:        General: No tenderness or edema. Normal range of motion.     Cervical back: Normal range of motion and neck supple.     Comments: Arthritis changes in hands.  Lymphadenopathy:    She has no cervical adenopathy.    She has no axillary adenopathy.       Right: No supraclavicular adenopathy present.       Left: No supraclavicular adenopathy present.  Neurological: She is alert and oriented to person, place, and time. She has normal reflexes.  Skin: Skin is warm and dry. No rash noted. She is not diaphoretic. No cyanosis or erythema. No pallor.  Psychiatric: She has a normal mood and affect. Her behavior is normal. Judgment and thought content normal.  Nursing note and vitals reviewed.   No visits with results within 3 Day(s) from this visit.  Latest known visit with results is:  Appointment on 04/09/2020  Component Date Value Ref Range Status  . Ferritin 04/09/2020 47  11 - 307 ng/mL Final   Performed at [facility], [address] [place], [place] [address]  . WBC 04/09/2020 7.7  4.0 - 10.5 K/uL  Final  . RBC 04/09/2020 4.40  3.87 - 5.11 MIL/uL Final  . Hemoglobin 04/09/2020 13.1  12.0 - 15.0 g/dL Final  . HCT 04/09/2020 40.4  36.0 - 46.0 % Final  . MCV 04/09/2020 91.8  80.0 - 100.0 fL Final  . MCH 04/09/2020 29.8  26.0 - 34.0 pg Final  . MCHC 04/09/2020 32.4  30.0 - 36.0 g/dL Final  . RDW 04/09/2020 16.6* 11.5 - 15.5 % Final  . Platelets 04/09/2020 309  150 - 400 K/uL Final  . nRBC 04/09/2020 0.0  0.0 - 0.2 % Final  . Neutrophils Relative % 04/09/2020 58  % Final  . Neutro Abs 04/09/2020 4.4  1.7 - 7.7 K/uL Final  . Lymphocytes Relative 04/09/2020 29  % Final  . Lymphs Abs 04/09/2020 2.2  0.7 - 4.0 K/uL Final  . Monocytes Relative 04/09/2020 10  % Final  . Monocytes Absolute 04/09/2020 0.8  0.1 - 1.0 K/uL Final  .  Eosinophils Relative 04/09/2020 2  % Final  . Eosinophils Absolute 04/09/2020 0.2  0.0 - 0.5 K/uL Final  . Basophils Relative 04/09/2020 1  % Final  . Basophils Absolute 04/09/2020 0.1  0.0 - 0.1 K/uL Final  . Immature Granulocytes 04/09/2020 0  % Final  . Abs Immature Granulocytes 04/09/2020 0.03  0.00 - 0.07 K/uL Final   Performed at [facility], [address] [place], [place] [address]    Assessment:  Angelica Walker is a 79 y.o. female with iron deficiency anemia secondary to gastric ulcer and GI bleed in 03/2018.  She is intolerant of oral iron.  She has received Venofer weekly x 4 (10/08/2018 -10/29/2018), 07/17/2019, and x 3 (01/19/2020 - 02/02/2020).  Ferritin has been followed: 9 on 01/18/2018, 11 on 03/18/2018, 13 on 09/30/2018, 27 on 07/14/2019, 17 on 01/14/2020, 83 on 03/01/2020, and 47 on 04/09/2020.  EGD on 12/07/2017 revealed  Z-line variable.  There was a non-obstructing non-bleeding gastric ulcer with no stigmata of bleeding. The duodenum was normal. Biopsies showed chronic nonspecific gastritis.  There was no H. pylori, dysplasia or malignancy. EGD on 03/21/2018 revealed Z-line variable.  There was erosive gastritis with healing ulceration in the stomach.  There was no H. pylori, dysplasia or malignancy.  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 EGD on 07/22/2018 revealed Z-line variable.  There was non-bleeding gastric ulcer with no stigmata of bleeding.  Acquired deformity in the gastric body (posterior wall).  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     Colonoscopy on 12/07/2017 revealed three 1 to 2 mm polyps in the rectum, removed. Diverticulosis in the sigmoid colon. Tortuous colon. 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  Pathology revealed 3 hyperplastic polyps.  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       She has a smoking history.  Low dose chest CT on 01/21/2019 was Lung-RADS 1.  Symptomatically, she feels "ok".  She denies any bleeding.  Exam is stable.  Plan: 1.   Review labs from 04/09/2020. 2.   Iron deficiency anemia             She has a history of gastric ulcers, gastritis, and angiectasias.             She denies any bleeding.  Hematocrit 40.4.  Hemoglobin 13.1.  MCV 91.8.    Ferritin 47.  She receives Venofer if ferritin < 30.             No Venofer today. 3.   Health maintenance  She has a smoking history.  Low-dose chest CT on 05/03/2020. 4.   RTC in 3 months or labs (CBC with diff, ferritin) 5.   RTC in 6 months for MD assessment, labs (CBC with diff, ferritin- day before), and +/- Venofer.   I discussed the assessment and treatment plan with the patient.  The patient was provided an opportunity to ask questions and all were answered.  The patient agreed with the plan and demonstrated an understanding of the instructions.  The patient was advised to call back if the symptoms worsen or if the condition fails to improve as anticipated.   Lequita Asal, MD, PhD    04/12/2020, 11:04 AM   I, Selena Batten, am acting as a scribe for Lequita Asal, MD.  I, [place] Mike Gip, MD, have reviewed the above documentation for accuracy and completeness, and I agree with the above.

## 2020-04-09 ENCOUNTER — Inpatient Hospital Stay: Payer: Medicare Other | Attending: Hematology and Oncology

## 2020-04-09 ENCOUNTER — Other Ambulatory Visit: Payer: Self-pay

## 2020-04-09 DIAGNOSIS — D509 Iron deficiency anemia, unspecified: Secondary | ICD-10-CM | POA: Diagnosis not present

## 2020-04-09 DIAGNOSIS — D500 Iron deficiency anemia secondary to blood loss (chronic): Secondary | ICD-10-CM

## 2020-04-09 LAB — CBC WITH DIFFERENTIAL/PLATELET
Abs Immature Granulocytes: 0.03 10*3/uL (ref 0.00–0.07)
Basophils Absolute: 0.1 10*3/uL (ref 0.0–0.1)
Basophils Relative: 1 %
Eosinophils Absolute: 0.2 10*3/uL (ref 0.0–0.5)
Eosinophils Relative: 2 %
HCT: 40.4 % (ref 36.0–46.0)
Hemoglobin: 13.1 g/dL (ref 12.0–15.0)
Immature Granulocytes: 0 %
Lymphocytes Relative: 29 %
Lymphs Abs: 2.2 10*3/uL (ref 0.7–4.0)
MCH: 29.8 pg (ref 26.0–34.0)
MCHC: 32.4 g/dL (ref 30.0–36.0)
MCV: 91.8 fL (ref 80.0–100.0)
Monocytes Absolute: 0.8 10*3/uL (ref 0.1–1.0)
Monocytes Relative: 10 %
Neutro Abs: 4.4 10*3/uL (ref 1.7–7.7)
Neutrophils Relative %: 58 %
Platelets: 309 10*3/uL (ref 150–400)
RBC: 4.4 MIL/uL (ref 3.87–5.11)
RDW: 16.6 % — ABNORMAL HIGH (ref 11.5–15.5)
WBC: 7.7 10*3/uL (ref 4.0–10.5)
nRBC: 0 % (ref 0.0–0.2)

## 2020-04-09 LAB — FERRITIN: Ferritin: 47 ng/mL (ref 11–307)

## 2020-04-11 ENCOUNTER — Encounter: Payer: Self-pay | Admitting: Hematology and Oncology

## 2020-04-11 ENCOUNTER — Other Ambulatory Visit: Payer: Self-pay

## 2020-04-11 NOTE — Progress Notes (Signed)
No new changes noted today. The patient name and DOB has been verified by phone today. 

## 2020-04-12 ENCOUNTER — Encounter: Payer: Self-pay | Admitting: Hematology and Oncology

## 2020-04-12 ENCOUNTER — Inpatient Hospital Stay: Payer: Medicare Other

## 2020-04-12 ENCOUNTER — Inpatient Hospital Stay: Payer: Medicare Other | Admitting: Hematology and Oncology

## 2020-04-12 VITALS — BP 126/73 | HR 78 | Temp 96.8°F | Resp 18 | Wt 128.4 lb

## 2020-04-12 DIAGNOSIS — D509 Iron deficiency anemia, unspecified: Secondary | ICD-10-CM | POA: Diagnosis not present

## 2020-04-12 DIAGNOSIS — D500 Iron deficiency anemia secondary to blood loss (chronic): Secondary | ICD-10-CM | POA: Diagnosis not present

## 2020-04-12 DIAGNOSIS — F17200 Nicotine dependence, unspecified, uncomplicated: Secondary | ICD-10-CM | POA: Diagnosis not present

## 2020-04-12 NOTE — Progress Notes (Signed)
Pt in for follow up, denies any concerns today. 

## 2020-04-13 ENCOUNTER — Telehealth: Payer: Self-pay | Admitting: *Deleted

## 2020-04-13 DIAGNOSIS — Z87891 Personal history of nicotine dependence: Secondary | ICD-10-CM

## 2020-04-13 NOTE — Telephone Encounter (Signed)
Patient has been notified that annual lung cancer screening low dose CT scan is due currently or will be in near future. Confirmed that patient is within the age range of 55-77, and asymptomatic, (no signs or symptoms of lung cancer). Patient denies illness that would prevent curative treatment for lung cancer if found. Verified smoking history, (current, 57pack year). The shared decision making visit was done 01/21/19. Patient is agreeable for CT scan being scheduled.

## 2020-04-13 NOTE — Telephone Encounter (Signed)
Left message for patient to notify them that it is time to schedule annual low dose lung cancer screening CT scan. Instructed patient to call back to verify information prior to the scan being scheduled.  

## 2020-04-13 NOTE — Addendum Note (Signed)
Addended by: Lieutenant Diego on: 04/13/2020 11:20 AM   Modules accepted: Orders

## 2020-04-23 NOTE — Telephone Encounter (Signed)
Patient informed of low dose lung cancer screening CT scan appointment on 04/28/20 @ 11:15.

## 2020-04-26 ENCOUNTER — Telehealth: Payer: Self-pay | Admitting: Family Medicine

## 2020-04-26 NOTE — Telephone Encounter (Signed)
Please call pt and set her up for appt- explain to her that vein and vascular would be the ones to address this issue. If she needs a referral- we will need to see her first

## 2020-04-26 NOTE — Telephone Encounter (Signed)
Copied from [place] [id number]. Topic: General - Other >> Apr 26, 2020  9:59 AM Alanda Slim E wrote: Reason for CRM: Pt has a right carotid Bruit that she wants to makes sure Dr. Ronnald Ramp follows up with / she asked to speak with the nurse/ please advise

## 2020-04-28 ENCOUNTER — Ambulatory Visit: Admission: RE | Admit: 2020-04-28 | Payer: Medicare Other | Source: Ambulatory Visit

## 2020-04-29 NOTE — Telephone Encounter (Signed)
Called patient with an appointment reminder for low dose lung cancer screening CT scan on 05/04/20 @ 10:30.  Patient states that she would like to cancel the CT scan and does not want to reschedule and she is so many medical appointments.  She request for me to let Shawn know she does not want to reschedule.

## 2020-05-03 ENCOUNTER — Ambulatory Visit: Payer: Medicare Other | Admitting: Family Medicine

## 2020-05-04 ENCOUNTER — Ambulatory Visit: Payer: Medicare Other

## 2020-05-07 ENCOUNTER — Ambulatory Visit
Admission: RE | Admit: 2020-05-07 | Discharge: 2020-05-07 | Disposition: A | Discharge status: Home / Self Care | Payer: Medicare Other | Source: Ambulatory Visit | Attending: Oncology | Admitting: Oncology

## 2020-05-07 ENCOUNTER — Encounter (INDEPENDENT_AMBULATORY_CARE_PROVIDER_SITE_OTHER): Payer: Self-pay

## 2020-05-07 ENCOUNTER — Other Ambulatory Visit: Payer: Self-pay

## 2020-05-07 ENCOUNTER — Ambulatory Visit
Admission: RE | Admit: 2020-05-07 | Discharge: 2020-05-07 | Disposition: A | Discharge status: Home / Self Care | Payer: Medicare Other | Source: Ambulatory Visit | Attending: Family Medicine | Admitting: Family Medicine

## 2020-05-07 DIAGNOSIS — Z87891 Personal history of nicotine dependence: Secondary | ICD-10-CM

## 2020-05-07 DIAGNOSIS — R0989 Other specified symptoms and signs involving the circulatory and respiratory systems: Secondary | ICD-10-CM | POA: Insufficient documentation

## 2020-05-07 DIAGNOSIS — I6523 Occlusion and stenosis of bilateral carotid arteries: Secondary | ICD-10-CM | POA: Diagnosis not present

## 2020-05-11 ENCOUNTER — Encounter: Payer: Self-pay | Admitting: *Deleted

## 2020-05-11 DIAGNOSIS — H2513 Age-related nuclear cataract, bilateral: Secondary | ICD-10-CM | POA: Diagnosis not present

## 2020-05-25 DIAGNOSIS — L821 Other seborrheic keratosis: Secondary | ICD-10-CM | POA: Diagnosis not present

## 2020-05-25 DIAGNOSIS — L570 Actinic keratosis: Secondary | ICD-10-CM | POA: Diagnosis not present

## 2020-05-25 DIAGNOSIS — Z872 Personal history of diseases of the skin and subcutaneous tissue: Secondary | ICD-10-CM | POA: Diagnosis not present

## 2020-05-25 DIAGNOSIS — L578 Other skin changes due to chronic exposure to nonionizing radiation: Secondary | ICD-10-CM | POA: Diagnosis not present

## 2020-05-25 DIAGNOSIS — Z859 Personal history of malignant neoplasm, unspecified: Secondary | ICD-10-CM | POA: Diagnosis not present

## 2020-06-18 ENCOUNTER — Telehealth: Payer: Self-pay | Admitting: Family Medicine

## 2020-06-18 DIAGNOSIS — J449 Chronic obstructive pulmonary disease, unspecified: Secondary | ICD-10-CM | POA: Diagnosis not present

## 2020-06-18 DIAGNOSIS — H2512 Age-related nuclear cataract, left eye: Secondary | ICD-10-CM | POA: Diagnosis not present

## 2020-06-18 NOTE — Telephone Encounter (Signed)
Copied from [place] [phone]. Topic: General - Other >> Jun 18, 2020  2:55 PM Celene Kras wrote: Reason for CRM: Dorian Pod, from [facility], called stating that the pt is scheduled for eye syrgery on 06/29/20. Dorian Pod states that the Dr listened to her heart and lungs. Dorian Pod states that the Dr heard a heart murmur and is needing to know if there is history. Dorian Pod states that she is needing a call back today if possible. Please advise.

## 2020-06-18 NOTE — Telephone Encounter (Signed)
Copied from [place] [phone]. Topic: General - Other >> Jun 18, 2020  2:55 PM Celene Kras wrote: Reason for CRM: Dorian Pod, from [facility], called stating that the pt is scheduled for eye syrgery on 06/29/20. Dorian Pod states that the Dr listened to her heart and lungs. Dorian Pod states that the Dr heard a heart murmur and is needing to know if there is history. Dorian Pod states that she is needing a call back today if possible. Please advise.

## 2020-06-18 NOTE — Telephone Encounter (Signed)
Pt coming in at 840 on 7/7

## 2020-06-22 ENCOUNTER — Other Ambulatory Visit: Payer: Self-pay

## 2020-06-22 ENCOUNTER — Encounter: Payer: Self-pay | Admitting: Ophthalmology

## 2020-06-23 ENCOUNTER — Encounter: Payer: Self-pay | Admitting: Family Medicine

## 2020-06-23 ENCOUNTER — Ambulatory Visit (INDEPENDENT_AMBULATORY_CARE_PROVIDER_SITE_OTHER): Payer: Medicare Other | Admitting: Family Medicine

## 2020-06-23 VITALS — BP 120/74 | HR 72 | Ht 62.0 in | Wt 123.0 lb

## 2020-06-23 DIAGNOSIS — Z01818 Encounter for other preprocedural examination: Secondary | ICD-10-CM

## 2020-06-23 DIAGNOSIS — E785 Hyperlipidemia, unspecified: Secondary | ICD-10-CM | POA: Diagnosis not present

## 2020-06-23 DIAGNOSIS — R9431 Abnormal electrocardiogram [ECG] [EKG]: Secondary | ICD-10-CM | POA: Diagnosis not present

## 2020-06-23 DIAGNOSIS — I700 Atherosclerosis of aorta: Secondary | ICD-10-CM

## 2020-06-23 DIAGNOSIS — I10 Essential (primary) hypertension: Secondary | ICD-10-CM

## 2020-06-23 DIAGNOSIS — F17200 Nicotine dependence, unspecified, uncomplicated: Secondary | ICD-10-CM

## 2020-06-23 DIAGNOSIS — R7303 Prediabetes: Secondary | ICD-10-CM

## 2020-06-23 NOTE — Progress Notes (Signed)
Date:  06/23/2020   Name:  Angelica Walker   DOB:  April 09, 1941   MRN:  [id number]   Chief Complaint: Pre-op Exam (having cataract surg with Dr Edison Pace on 06/29/20)  Patient is a 79 year old female who presents for a surgical clearance exam exam. The patient reports the following problems: none. Health maintenance has been reviewed up to date.   Lab Results  Component Value Date   CREATININE 0.61 04/07/2020   BUN 11 04/07/2020   NA 142 04/07/2020   K 4.3 04/07/2020   CL 102 04/07/2020   CO2 26 04/07/2020   Lab Results  Component Value Date   CHOL 186 04/07/2020   HDL 51 04/07/2020   LDLCALC 113 (H) 04/07/2020   TRIG 123 04/07/2020   CHOLHDL 3.5 12/19/2017   Lab Results  Component Value Date   TSH 1.900 12/19/2017   Lab Results  Component Value Date   HGBA1C 5.7 (H) 04/07/2020   Lab Results  Component Value Date   WBC 7.7 04/09/2020   HGB 13.1 04/09/2020   HCT 40.4 04/09/2020   MCV 91.8 04/09/2020   PLT 309 04/09/2020   Lab Results  Component Value Date   ALT 8 12/19/2017   AST 18 12/19/2017   ALKPHOS 87 12/19/2017   BILITOT 0.4 12/19/2017     Review of Systems  Constitutional: Negative.  Negative for chills, fatigue, fever and unexpected weight change.  HENT: Negative for congestion, ear discharge, ear pain, postnasal drip, rhinorrhea, sinus pressure, sneezing and sore throat.   Eyes: Negative for photophobia, pain, discharge, redness and itching.  Respiratory: Negative for cough, chest tightness, shortness of breath, wheezing and stridor.   Cardiovascular: Negative for chest pain, palpitations and leg swelling.  Gastrointestinal: Negative for abdominal pain, blood in stool, constipation, diarrhea, nausea and vomiting.  Endocrine: Negative for cold intolerance, heat intolerance, polydipsia, polyphagia and polyuria.  Genitourinary: Negative for dysuria, flank pain, frequency, hematuria, menstrual problem, pelvic pain, urgency, vaginal bleeding and vaginal  discharge.  Musculoskeletal: Negative for arthralgias, back pain and myalgias.  Skin: Negative for rash.  Allergic/Immunologic: Negative for environmental allergies and food allergies.  Neurological: Negative for dizziness, weakness, light-headedness, numbness and headaches.  Hematological: Negative for adenopathy. Does not bruise/bleed easily.  Psychiatric/Behavioral: Negative for dysphoric mood. The patient is not nervous/anxious.     Patient Active Problem List   Diagnosis Date Noted  . Iron deficiency anemia due to chronic blood loss 07/02/2018  . Smoker 03/18/2018  . RLS (restless legs syndrome) 01/18/2018  . Anemia due to blood loss 12/20/2017  . Prediabetes 12/19/2017  . Hyperlipidemia, unspecified 12/19/2017  . Hypertension, essential, benign 12/19/2017  . B12 deficiency 12/19/2017  . DDD (degenerative disc disease), lumbar 12/19/2017  . Multiple gastric ulcers 12/19/2017  . COPD (chronic obstructive pulmonary disease) ([place]) 12/21/2014    Allergies  Allergen Reactions  . Simvastatin Other (See Comments)    Past Surgical History:  Procedure Laterality Date  . COLONOSCOPY WITH PROPOFOL N/A 12/07/2017   Procedure: COLONOSCOPY WITH PROPOFOL;  Surgeon: Lollie Sails, MD;  Location: [facility] ENDOSCOPY;  Service: Endoscopy;  Laterality: N/A;  . ESOPHAGOGASTRODUODENOSCOPY (EGD) WITH PROPOFOL N/A 12/07/2017   Procedure: ESOPHAGOGASTRODUODENOSCOPY (EGD) WITH PROPOFOL;  Surgeon: Lollie Sails, MD;  Location: [facility] ENDOSCOPY;  Service: Endoscopy;  Laterality: N/A;  . ESOPHAGOGASTRODUODENOSCOPY (EGD) WITH PROPOFOL N/A 03/21/2018   Procedure: ESOPHAGOGASTRODUODENOSCOPY (EGD) WITH PROPOFOL;  Surgeon: Lollie Sails, MD;  Location: [facility] ENDOSCOPY;  Service: Endoscopy;  Laterality: N/A;  .  ESOPHAGOGASTRODUODENOSCOPY (EGD) WITH PROPOFOL N/A 07/22/2018   Procedure: ESOPHAGOGASTRODUODENOSCOPY (EGD) WITH PROPOFOL;  Surgeon: Lollie Sails, MD;  Location: [facility] ENDOSCOPY;  Service:  Endoscopy;  Laterality: N/A;    Social History   Tobacco Use  . Smoking status: Current Every Day Smoker    Packs/day: 1.00    Years: 54.00    Pack years: 54.00    Types: Cigarettes  . Smokeless tobacco: Never Used  . Tobacco comment: patches and pills discussed   Vaping Use  . Vaping Use: Never used  Substance Use Topics  . Alcohol use: No  . Drug use: No     Medication list has been reviewed and updated.  Current Meds  Medication Sig  . acetaminophen (TYLENOL) 500 MG tablet Take 1,000 mg by mouth 2 (two) times daily as needed.  . Ascorbic Acid (VITAMIN C) 100 MG tablet Take 100 mg by mouth daily.  Marland Kitchen BLACK ELDERBERRY PO Take 1,000 mg by mouth daily.  . Calcium Carbonate-Vitamin D 600-200 MG-UNIT TABS Take by mouth.  . Cyanocobalamin (B-12) 1000 MCG TABS Take 1 tablet by mouth daily.  Marland Kitchen gabapentin (NEURONTIN) 100 MG capsule TAKE (1) CAPSULE BY MOUTH AT BEDTIME  . GLUCOSAMINE-CHONDROITIN PO Take 3 tablets by mouth.  Marland Kitchen lisinopril-hydrochlorothiazide (ZESTORETIC) 20-12.5 MG tablet TAKE (1) TABLET BY MOUTH EVERY DAY  . MELATONIN PO Take by mouth at bedtime as needed.  . pantoprazole (PROTONIX) 40 MG tablet TAKE (1) TABLET BY MOUTH EVERY DAY    PHQ 2/9 Scores 06/23/2020 04/07/2020 03/19/2019 03/13/2018  PHQ - 2 Score 0 0 0 0  PHQ- 9 Score 0 0 1 0    GAD 7 : Generalized Anxiety Score 06/23/2020 04/07/2020  Nervous, Anxious, on Edge 0 0  Control/stop worrying 0 0  Worry too much - different things 0 0  Trouble relaxing 0 0  Restless 0 0  Easily annoyed or irritable 0 0  Afraid - awful might happen 0 0  Total GAD 7 Score 0 0    BP Readings from Last 3 Encounters:  06/23/20 120/74  04/12/20 126/73  04/07/20 120/60    Physical Exam Vitals and nursing note reviewed.  Constitutional:      Appearance: She is well-developed.  HENT:     Head: Normocephalic.     Right Ear: Tympanic membrane, ear canal and external ear normal.     Left Ear: Tympanic membrane, ear canal and  external ear normal.  Eyes:     General: Lids are everted, no foreign bodies appreciated. No scleral icterus.       Left eye: No foreign body or hordeolum.     Conjunctiva/sclera: Conjunctivae normal.     Right eye: Right conjunctiva is not injected.     Left eye: Left conjunctiva is not injected.     Pupils: Pupils are equal, round, and reactive to light.  Neck:     Thyroid: No thyromegaly.     Vascular: No carotid bruit or JVD.     Trachea: No tracheal deviation.  Cardiovascular:     Rate and Rhythm: Normal rate and regular rhythm.     Pulses: Normal pulses. No decreased pulses.          Carotid pulses are 2+ on the right side and 2+ on the left side.      Radial pulses are 2+ on the right side and 2+ on the left side.       Femoral pulses are 2+ on the right side and 2+  on the left side.      Popliteal pulses are 2+ on the right side and 2+ on the left side.       Dorsalis pedis pulses are 2+ on the right side and 2+ on the left side.       Posterior tibial pulses are 2+ on the right side and 2+ on the left side.     Heart sounds: Normal heart sounds, S1 normal and S2 normal. No murmur heard.  No systolic murmur is present.  No diastolic murmur is present.  No friction rub. No gallop. No S3 or S4 sounds.   Pulmonary:     Effort: Pulmonary effort is normal. No respiratory distress.     Breath sounds: Normal breath sounds. No decreased breath sounds, wheezing, rhonchi or rales.  Abdominal:     General: Bowel sounds are normal.     Palpations: Abdomen is soft. There is no mass.     Tenderness: There is no abdominal tenderness. There is no guarding or rebound.  Musculoskeletal:        General: No tenderness. Normal range of motion.     Cervical back: Normal range of motion and neck supple.     Right lower leg: No edema.     Left lower leg: No edema.  Lymphadenopathy:     Cervical: No cervical adenopathy.  Skin:    General: Skin is warm.     Findings: No rash.  Neurological:       Mental Status: She is alert and oriented to person, place, and time.     Cranial Nerves: No cranial nerve deficit.     Deep Tendon Reflexes: Reflexes normal.  Psychiatric:        Mood and Affect: Mood is not anxious or depressed.     Wt Readings from Last 3 Encounters:  06/23/20 123 lb (55.8 kg)  04/12/20 128 lb 6.7 oz (58.3 kg)  04/07/20 129 lb (58.5 kg)    BP 120/74   Pulse 72   Ht 5\' 2"  (1.575 m)   Wt 123 lb (55.8 kg)   BMI 22.50 kg/m   Assessment and Plan: 1. Preop examination Patient presents for surgical clearance for cataract surgery.  There is noted that patient had a heart murmur on recent exam as well as some irregularity to the heartbeat.  EKG was done today:I have reviewed EKG which shows rate 74 regularity of heartbeat secondary to frequent PACs.  Intervals noted to be normal no LVH changes by EKG criteria Axis is normal patient does have isoelectric T waves in inferior leads and some T wave inversion abnormality of the lateral leads.  We will refer to cardiology later in the week for further evaluation and clearance given that patient has multiple risk factors as noted below.. Comparison to previous EKG dated none for comparison. - EKG 12-Lead - Ambulatory referral to Cardiology  2. Nonspecific abnormal electrocardiogram (ECG) (EKG) EKG as noted above have some nonspecific T wave changes in the lateral leads and given patient's multiple risk factors we will have this evaluated by cardiology. - Ambulatory referral to Cardiology  3. Prediabetes Chronic.  Stable.  Controlled.  Last A1c 2 months ago was 5.7 and is controlled on current regimen of diet controlled.  4. Hyperlipidemia, unspecified hyperlipidemia type .  Controlled.  Stable.  Patient is currently controlling with diet.  5. Hypertension, essential, benign Chronic.  Controlled.  Stable.  Continue lisinopril hydrochlorothiazide 20-12.5 mg.  Blood pressure is stable today at  120/74.  6. Smoker Patient  has been advised of the health risks of smoking and counseled concerning cessation of tobacco products. I spent over 3 minutes for discussion and to answer questions.  7.aortic atheroscerosis.  Patient as noted on CT has aortic atherosclerosis and is currently treated with optimizing blood pressure control lipid control and controlling prediabetes with diet.  Patient has been discussed about smoking cessation.

## 2020-06-25 DIAGNOSIS — I700 Atherosclerosis of aorta: Secondary | ICD-10-CM | POA: Diagnosis not present

## 2020-06-25 DIAGNOSIS — R0602 Shortness of breath: Secondary | ICD-10-CM | POA: Diagnosis not present

## 2020-06-25 DIAGNOSIS — R002 Palpitations: Secondary | ICD-10-CM | POA: Diagnosis not present

## 2020-06-25 DIAGNOSIS — I10 Essential (primary) hypertension: Secondary | ICD-10-CM | POA: Diagnosis not present

## 2020-06-25 DIAGNOSIS — E7800 Pure hypercholesterolemia, unspecified: Secondary | ICD-10-CM | POA: Diagnosis not present

## 2020-06-28 NOTE — Discharge Instructions (Signed)

## 2020-06-29 ENCOUNTER — Encounter: Payer: Self-pay | Admitting: Ophthalmology

## 2020-06-29 ENCOUNTER — Ambulatory Visit: Payer: Medicare Other | Admitting: Anesthesiology

## 2020-06-29 ENCOUNTER — Encounter
Admission: RE | Disposition: A | Discharge status: Home / Self Care | Payer: Self-pay | Source: Home / Self Care | Attending: Ophthalmology

## 2020-06-29 ENCOUNTER — Other Ambulatory Visit: Payer: Self-pay

## 2020-06-29 ENCOUNTER — Ambulatory Visit
Admission: RE | Admit: 2020-06-29 | Discharge: 2020-06-29 | Disposition: A | Discharge status: Home / Self Care | Payer: Medicare Other | Attending: Ophthalmology | Admitting: Ophthalmology

## 2020-06-29 DIAGNOSIS — J449 Chronic obstructive pulmonary disease, unspecified: Secondary | ICD-10-CM | POA: Insufficient documentation

## 2020-06-29 DIAGNOSIS — H2512 Age-related nuclear cataract, left eye: Secondary | ICD-10-CM | POA: Diagnosis not present

## 2020-06-29 DIAGNOSIS — I10 Essential (primary) hypertension: Secondary | ICD-10-CM | POA: Insufficient documentation

## 2020-06-29 DIAGNOSIS — G2581 Restless legs syndrome: Secondary | ICD-10-CM | POA: Insufficient documentation

## 2020-06-29 DIAGNOSIS — F17200 Nicotine dependence, unspecified, uncomplicated: Secondary | ICD-10-CM | POA: Insufficient documentation

## 2020-06-29 DIAGNOSIS — H25812 Combined forms of age-related cataract, left eye: Secondary | ICD-10-CM | POA: Diagnosis not present

## 2020-06-29 HISTORY — DX: Anemia, unspecified: D64.9

## 2020-06-29 HISTORY — PX: CATARACT EXTRACTION W/PHACO: SHX586

## 2020-06-29 HISTORY — DX: Dorsopathy, unspecified: M53.9

## 2020-06-29 SURGERY — PHACOEMULSIFICATION, CATARACT, WITH IOL INSERTION
Anesthesia: Monitor Anesthesia Care | Site: Eye | Laterality: Left

## 2020-06-29 MED ORDER — TETRACAINE HCL 0.5 % OP SOLN
1.0000 [drp] | OPHTHALMIC | Status: DC | PRN
  Administered 2020-06-29 (×3): 1 [drp] via OPHTHALMIC

## 2020-06-29 MED ORDER — SODIUM HYALURONATE 10 MG/ML IO SOLN
INTRAOCULAR | Status: DC | PRN
  Administered 2020-06-29: 0.55 mL via INTRAOCULAR

## 2020-06-29 MED ORDER — FENTANYL CITRATE (PF) 100 MCG/2ML IJ SOLN
INTRAMUSCULAR | Status: DC | PRN
  Administered 2020-06-29: 50 ug via INTRAVENOUS

## 2020-06-29 MED ORDER — ARMC OPHTHALMIC DILATING DROPS
1.0000 "application " | OPHTHALMIC | Status: DC | PRN
  Administered 2020-06-29 (×3): 1 via OPHTHALMIC

## 2020-06-29 MED ORDER — LIDOCAINE HCL (PF) 2 % IJ SOLN
INTRAOCULAR | Status: DC | PRN
  Administered 2020-06-29: 2 mL via INTRAOCULAR

## 2020-06-29 MED ORDER — MIDAZOLAM HCL 2 MG/2ML IJ SOLN
INTRAMUSCULAR | Status: DC | PRN
  Administered 2020-06-29: 1 mg via INTRAVENOUS

## 2020-06-29 MED ORDER — MOXIFLOXACIN HCL 0.5 % OP SOLN
OPHTHALMIC | Status: DC | PRN
  Administered 2020-06-29: 0.2 mL via OPHTHALMIC

## 2020-06-29 MED ORDER — EPINEPHRINE PF 1 MG/ML IJ SOLN
INTRAOCULAR | Status: DC | PRN
  Administered 2020-06-29: 71 mL via OPHTHALMIC

## 2020-06-29 MED ORDER — SODIUM HYALURONATE 23 MG/ML IO SOLN
INTRAOCULAR | Status: DC | PRN
  Administered 2020-06-29: 0.6 mL via INTRAOCULAR

## 2020-06-29 SURGICAL SUPPLY — 20 items
CANNULA ANT/CHMB 27G (MISCELLANEOUS) ×2 IMPLANT
CANNULA ANT/CHMB 27GA (MISCELLANEOUS) ×6 IMPLANT
DISSECTOR HYDRO NUCLEUS 50X22 (MISCELLANEOUS) ×3 IMPLANT
GLOVE SURG LX 7.5 STRW (GLOVE) ×2
GLOVE SURG LX STRL 7.5 STRW (GLOVE) ×1 IMPLANT
GLOVE SURG SYN 8.5  E (GLOVE) ×2
GLOVE SURG SYN 8.5 E (GLOVE) ×1 IMPLANT
GLOVE SURG SYN 8.5 PF PI (GLOVE) ×1 IMPLANT
GOWN STRL REUS W/ TWL LRG LVL3 (GOWN DISPOSABLE) ×2 IMPLANT
GOWN STRL REUS W/TWL LRG LVL3 (GOWN DISPOSABLE) ×4
LENS IOL DIOP 24.5 (Intraocular Lens) ×3 IMPLANT
LENS IOL TECNIS MONO 24.5 (Intraocular Lens) IMPLANT
MARKER SKIN DUAL TIP RULER LAB (MISCELLANEOUS) ×3 IMPLANT
PACK DR. KING ARMS (PACKS) ×3 IMPLANT
PACK EYE AFTER SURG (MISCELLANEOUS) ×3 IMPLANT
PACK OPTHALMIC (MISCELLANEOUS) ×3 IMPLANT
SYR 3ML LL SCALE MARK (SYRINGE) ×3 IMPLANT
SYR TB 1ML LUER SLIP (SYRINGE) ×3 IMPLANT
WATER STERILE IRR 250ML POUR (IV SOLUTION) ×3 IMPLANT
WIPE NON LINTING 3.25X3.25 (MISCELLANEOUS) ×3 IMPLANT

## 2020-06-29 NOTE — Transfer of Care (Signed)
Immediate Anesthesia Transfer of Care Note  Patient: Angelica Walker  Procedure(s) Performed: CATARACT EXTRACTION PHACO AND INTRAOCULAR LENS PLACEMENT (IOC) LEFT 7.20 00:42.7 (Left Eye)  Patient Location: PACU  Anesthesia Type: MAC  Level of Consciousness: awake, alert  and patient cooperative  Airway and Oxygen Therapy: Patient Spontanous Breathing and Patient connected to supplemental oxygen  Post-op Assessment: Post-op Vital signs reviewed, Patient's Cardiovascular Status Stable, Respiratory Function Stable, Patent Airway and No signs of Nausea or vomiting  Post-op Vital Signs: Reviewed and stable  Complications: No complications documented.

## 2020-06-29 NOTE — Anesthesia Postprocedure Evaluation (Signed)
Anesthesia Post Note  Patient: Angelica Walker  Procedure(s) Performed: CATARACT EXTRACTION PHACO AND INTRAOCULAR LENS PLACEMENT (IOC) LEFT 7.20 00:42.7 (Left Eye)     Patient location during evaluation: PACU Anesthesia Type: MAC Level of consciousness: awake and alert Pain management: pain level controlled Vital Signs Assessment: post-procedure vital signs reviewed and stable Respiratory status: spontaneous breathing, nonlabored ventilation, respiratory function stable and patient connected to nasal cannula oxygen Cardiovascular status: stable and blood pressure returned to baseline Postop Assessment: no apparent nausea or vomiting Anesthetic complications: no   No complications documented.  Alisa Graff

## 2020-06-29 NOTE — H&P (Signed)

## 2020-06-29 NOTE — Anesthesia Preprocedure Evaluation (Signed)
Anesthesia Evaluation  Patient identified by MRN, date of birth, ID band Patient awake    Reviewed: Allergy & Precautions, H&P , NPO status , Patient's Chart, lab work & pertinent test results, reviewed documented beta blocker date and time   Airway Mallampati: II  TM Distance: >3 FB Neck ROM: full    Dental no notable dental hx.    Pulmonary COPD, Current Smoker,    Pulmonary exam normal breath sounds clear to auscultation       Cardiovascular Exercise Tolerance: Good hypertension, negative cardio ROS   Rhythm:regular Rate:Normal     Neuro/Psych negative neurological ROS  negative psych ROS   GI/Hepatic Neg liver ROS, PUD, GERD  ,  Endo/Other  negative endocrine ROS  Renal/GU negative Renal ROS  negative genitourinary   Musculoskeletal   Abdominal   Peds  Hematology negative hematology ROS (+)   Anesthesia Other Findings   Reproductive/Obstetrics negative OB ROS                             Anesthesia Physical Anesthesia Plan  ASA: II  Anesthesia Plan: MAC   Post-op Pain Management:    Induction:   PONV Risk Score and Plan: 1 and Treatment may vary due to age or medical condition  Airway Management Planned:   Additional Equipment:   Intra-op Plan:   Post-operative Plan:   Informed Consent: I have reviewed the patients History and Physical, chart, labs and discussed the procedure including the risks, benefits and alternatives for the proposed anesthesia with the patient or authorized representative who has indicated his/her understanding and acceptance.     Dental Advisory Given  Plan Discussed with: CRNA  Anesthesia Plan Comments:         Anesthesia Quick Evaluation

## 2020-06-29 NOTE — Anesthesia Procedure Notes (Signed)
Procedure Name: MAC Date/Time: 06/29/2020 9:24 AM Performed by: Nyoka Cowden, CRNA Pre-anesthesia Checklist: Patient identified, Emergency Drugs available, Suction available, Timeout performed and Patient being monitored Patient Re-evaluated:Patient Re-evaluated prior to induction Oxygen Delivery Method: Nasal cannula Placement Confirmation: positive ETCO2

## 2020-06-29 NOTE — Op Note (Signed)
OPERATIVE NOTE  Angelica Walker [id number] 06/29/2020   PREOPERATIVE DIAGNOSIS:  Nuclear sclerotic cataract left eye.  H25.12   POSTOPERATIVE DIAGNOSIS:    Nuclear sclerotic cataract left eye.     PROCEDURE:  Phacoemusification with posterior chamber intraocular lens placement of the left eye   LENS:   Implant Name Type Inv. Item Serial No. Manufacturer Lot No. LRB No. Used Action  LENS IOL DIOP 24.5 - [id number] Intraocular Lens LENS IOL DIOP 24.5 [id number] [employer]  Left 1 Implanted      Procedure(s): CATARACT EXTRACTION PHACO AND INTRAOCULAR LENS PLACEMENT (IOC) LEFT 7.20 00:42.7 (Left)  DCB00 +24.5   ULTRASOUND TIME: 0 minutes 42 seconds.  CDE 7.20   SURGEON:  Benay Pillow, MD, MPH   ANESTHESIA:  Topical with tetracaine drops augmented with 1% preservative-free intracameral lidocaine.  ESTIMATED BLOOD LOSS: <1 mL   COMPLICATIONS:  None.   DESCRIPTION OF PROCEDURE:  The patient was identified in the holding room and transported to the operating room and placed in the supine position under the operating microscope.  The left eye was identified as the operative eye and it was prepped and draped in the usual sterile ophthalmic fashion.   A 1.0 millimeter clear-corneal paracentesis was made at the 5:00 position. 0.5 ml of preservative-free 1% lidocaine with epinephrine was injected into the anterior chamber.  The anterior chamber was filled with Healon 5 viscoelastic.  A 2.4 millimeter keratome was used to make a near-clear corneal incision at the 2:00 position.  A curvilinear capsulorrhexis was made with a cystotome and capsulorrhexis forceps.  Balanced salt solution was used to hydrodissect and hydrodelineate the nucleus.   Phacoemulsification was then used in stop and chop fashion to remove the lens nucleus and epinucleus.  The remaining cortex was then removed using the irrigation and aspiration handpiece. Healon was then placed into the capsular bag to distend  it for lens placement.  A lens was then injected into the capsular bag.  The remaining viscoelastic was aspirated.   Wounds were hydrated with balanced salt solution.  The anterior chamber was inflated to a physiologic pressure with balanced salt solution.  Intracameral vigamox 0.1 mL undiltued was injected into the eye and a drop placed onto the ocular surface.  No wound leaks were noted.  The patient was taken to the recovery room in stable condition without complications of anesthesia or surgery  Benay Pillow 06/29/2020, 9:43 AM

## 2020-07-01 ENCOUNTER — Encounter: Payer: Self-pay | Admitting: Ophthalmology

## 2020-07-12 ENCOUNTER — Other Ambulatory Visit: Payer: Self-pay

## 2020-07-12 ENCOUNTER — Inpatient Hospital Stay: Payer: Medicare Other | Attending: Hematology and Oncology

## 2020-07-12 DIAGNOSIS — D509 Iron deficiency anemia, unspecified: Secondary | ICD-10-CM | POA: Insufficient documentation

## 2020-07-12 DIAGNOSIS — D500 Iron deficiency anemia secondary to blood loss (chronic): Secondary | ICD-10-CM

## 2020-07-12 LAB — CBC WITH DIFFERENTIAL/PLATELET
Abs Immature Granulocytes: 0.02 10*3/uL (ref 0.00–0.07)
Basophils Absolute: 0.1 10*3/uL (ref 0.0–0.1)
Basophils Relative: 1 %
Eosinophils Absolute: 0.1 10*3/uL (ref 0.0–0.5)
Eosinophils Relative: 2 %
HCT: 39.7 % (ref 36.0–46.0)
Hemoglobin: 13.3 g/dL (ref 12.0–15.0)
Immature Granulocytes: 0 %
Lymphocytes Relative: 37 %
Lymphs Abs: 2.6 10*3/uL (ref 0.7–4.0)
MCH: 30.4 pg (ref 26.0–34.0)
MCHC: 33.5 g/dL (ref 30.0–36.0)
MCV: 90.6 fL (ref 80.0–100.0)
Monocytes Absolute: 0.8 10*3/uL (ref 0.1–1.0)
Monocytes Relative: 11 %
Neutro Abs: 3.5 10*3/uL (ref 1.7–7.7)
Neutrophils Relative %: 49 %
Platelets: 311 10*3/uL (ref 150–400)
RBC: 4.38 MIL/uL (ref 3.87–5.11)
RDW: 14.5 % (ref 11.5–15.5)
WBC: 7.1 10*3/uL (ref 4.0–10.5)
nRBC: 0 % (ref 0.0–0.2)

## 2020-07-12 LAB — FERRITIN: Ferritin: 32 ng/mL (ref 11–307)

## 2020-07-13 DIAGNOSIS — H2511 Age-related nuclear cataract, right eye: Secondary | ICD-10-CM | POA: Diagnosis not present

## 2020-07-19 ENCOUNTER — Encounter: Payer: Self-pay | Admitting: Ophthalmology

## 2020-07-22 ENCOUNTER — Other Ambulatory Visit: Payer: Medicare Other | Attending: Ophthalmology

## 2020-07-22 NOTE — Discharge Instructions (Signed)

## 2020-07-23 ENCOUNTER — Other Ambulatory Visit: Payer: Self-pay

## 2020-07-23 ENCOUNTER — Other Ambulatory Visit
Admission: RE | Admit: 2020-07-23 | Discharge: 2020-07-23 | Disposition: A | Discharge status: Home / Self Care | Payer: Medicare Other | Source: Ambulatory Visit | Attending: Ophthalmology | Admitting: Ophthalmology

## 2020-07-23 DIAGNOSIS — Z01812 Encounter for preprocedural laboratory examination: Secondary | ICD-10-CM | POA: Insufficient documentation

## 2020-07-23 DIAGNOSIS — Z20822 Contact with and (suspected) exposure to covid-19: Secondary | ICD-10-CM | POA: Diagnosis not present

## 2020-07-24 LAB — SARS CORONAVIRUS 2 (TAT 6-24 HRS): SARS Coronavirus 2: NEGATIVE

## 2020-07-26 ENCOUNTER — Ambulatory Visit: Payer: Medicare Other | Admitting: Anesthesiology

## 2020-07-26 ENCOUNTER — Encounter
Admission: RE | Disposition: A | Discharge status: Home / Self Care | Payer: Self-pay | Source: Home / Self Care | Attending: Ophthalmology

## 2020-07-26 ENCOUNTER — Other Ambulatory Visit: Payer: Self-pay

## 2020-07-26 ENCOUNTER — Ambulatory Visit
Admission: RE | Admit: 2020-07-26 | Discharge: 2020-07-26 | Disposition: A | Discharge status: Home / Self Care | Payer: Medicare Other | Attending: Ophthalmology | Admitting: Ophthalmology

## 2020-07-26 ENCOUNTER — Encounter: Payer: Self-pay | Admitting: Ophthalmology

## 2020-07-26 DIAGNOSIS — Z9842 Cataract extraction status, left eye: Secondary | ICD-10-CM | POA: Diagnosis not present

## 2020-07-26 DIAGNOSIS — D649 Anemia, unspecified: Secondary | ICD-10-CM | POA: Diagnosis not present

## 2020-07-26 DIAGNOSIS — F17200 Nicotine dependence, unspecified, uncomplicated: Secondary | ICD-10-CM | POA: Diagnosis not present

## 2020-07-26 DIAGNOSIS — H25811 Combined forms of age-related cataract, right eye: Secondary | ICD-10-CM | POA: Diagnosis not present

## 2020-07-26 DIAGNOSIS — E1136 Type 2 diabetes mellitus with diabetic cataract: Secondary | ICD-10-CM | POA: Insufficient documentation

## 2020-07-26 DIAGNOSIS — K219 Gastro-esophageal reflux disease without esophagitis: Secondary | ICD-10-CM | POA: Insufficient documentation

## 2020-07-26 DIAGNOSIS — G2581 Restless legs syndrome: Secondary | ICD-10-CM | POA: Diagnosis not present

## 2020-07-26 DIAGNOSIS — J449 Chronic obstructive pulmonary disease, unspecified: Secondary | ICD-10-CM | POA: Diagnosis not present

## 2020-07-26 DIAGNOSIS — M1990 Unspecified osteoarthritis, unspecified site: Secondary | ICD-10-CM | POA: Diagnosis not present

## 2020-07-26 DIAGNOSIS — E785 Hyperlipidemia, unspecified: Secondary | ICD-10-CM | POA: Insufficient documentation

## 2020-07-26 DIAGNOSIS — H2511 Age-related nuclear cataract, right eye: Secondary | ICD-10-CM | POA: Diagnosis not present

## 2020-07-26 DIAGNOSIS — I10 Essential (primary) hypertension: Secondary | ICD-10-CM | POA: Diagnosis not present

## 2020-07-26 HISTORY — PX: CATARACT EXTRACTION W/PHACO: SHX586

## 2020-07-26 SURGERY — PHACOEMULSIFICATION, CATARACT, WITH IOL INSERTION
Anesthesia: Monitor Anesthesia Care | Site: Eye | Laterality: Right

## 2020-07-26 MED ORDER — ACETAMINOPHEN 10 MG/ML IV SOLN: 1000.0000 mg | Freq: Once | INTRAVENOUS | Status: DC | PRN

## 2020-07-26 MED ORDER — LACTATED RINGERS IV SOLN: INTRAVENOUS | Status: DC

## 2020-07-26 MED ORDER — FENTANYL CITRATE (PF) 100 MCG/2ML IJ SOLN
INTRAMUSCULAR | Status: DC | PRN
  Administered 2020-07-26 (×2): 25 ug via INTRAVENOUS
  Administered 2020-07-26: 50 ug via INTRAVENOUS

## 2020-07-26 MED ORDER — EPINEPHRINE PF 1 MG/ML IJ SOLN
INTRAOCULAR | Status: DC | PRN
  Administered 2020-07-26: 122 mL via OPHTHALMIC

## 2020-07-26 MED ORDER — MIDAZOLAM HCL 2 MG/2ML IJ SOLN
INTRAMUSCULAR | Status: DC | PRN
  Administered 2020-07-26: 2 mg via INTRAVENOUS

## 2020-07-26 MED ORDER — SODIUM HYALURONATE 10 MG/ML IO SOLN
INTRAOCULAR | Status: DC | PRN
  Administered 2020-07-26: 0.55 mL via INTRAOCULAR

## 2020-07-26 MED ORDER — MOXIFLOXACIN HCL 0.5 % OP SOLN
OPHTHALMIC | Status: DC | PRN
  Administered 2020-07-26: 0.2 mL via OPHTHALMIC

## 2020-07-26 MED ORDER — SODIUM HYALURONATE 23 MG/ML IO SOLN
INTRAOCULAR | Status: DC | PRN
  Administered 2020-07-26: 0.6 mL via INTRAOCULAR

## 2020-07-26 MED ORDER — ONDANSETRON HCL 4 MG/2ML IJ SOLN: 4.0000 mg | Freq: Once | INTRAMUSCULAR | Status: DC | PRN

## 2020-07-26 MED ORDER — ARMC OPHTHALMIC DILATING DROPS
1.0000 "application " | OPHTHALMIC | Status: DC | PRN
  Administered 2020-07-26 (×3): 1 via OPHTHALMIC

## 2020-07-26 MED ORDER — LIDOCAINE HCL (PF) 2 % IJ SOLN
INTRAOCULAR | Status: DC | PRN
  Administered 2020-07-26: 1 mL via INTRAOCULAR

## 2020-07-26 MED ORDER — TETRACAINE HCL 0.5 % OP SOLN
1.0000 [drp] | OPHTHALMIC | Status: DC | PRN
  Administered 2020-07-26 (×3): 1 [drp] via OPHTHALMIC

## 2020-07-26 SURGICAL SUPPLY — 20 items
CANNULA ANT/CHMB 27G (MISCELLANEOUS) ×2 IMPLANT
CANNULA ANT/CHMB 27GA (MISCELLANEOUS) ×6 IMPLANT
DISSECTOR HYDRO NUCLEUS 50X22 (MISCELLANEOUS) ×3 IMPLANT
GLOVE SURG LX 7.5 STRW (GLOVE) ×2
GLOVE SURG LX STRL 7.5 STRW (GLOVE) ×1 IMPLANT
GLOVE SURG SYN 8.5  E (GLOVE) ×2
GLOVE SURG SYN 8.5 E (GLOVE) ×1 IMPLANT
GLOVE SURG SYN 8.5 PF PI (GLOVE) ×1 IMPLANT
GOWN STRL REUS W/ TWL LRG LVL3 (GOWN DISPOSABLE) ×2 IMPLANT
GOWN STRL REUS W/TWL LRG LVL3 (GOWN DISPOSABLE) ×6
LENS IOL DIOP 19.5 (Intraocular Lens) ×3 IMPLANT
LENS IOL TECNIS MONO 19.5 (Intraocular Lens) IMPLANT
MARKER SKIN DUAL TIP RULER LAB (MISCELLANEOUS) ×3 IMPLANT
PACK DR. KING ARMS (PACKS) ×3 IMPLANT
PACK EYE AFTER SURG (MISCELLANEOUS) ×3 IMPLANT
PACK OPTHALMIC (MISCELLANEOUS) ×3 IMPLANT
SYR 3ML LL SCALE MARK (SYRINGE) ×3 IMPLANT
SYR TB 1ML LUER SLIP (SYRINGE) ×3 IMPLANT
WATER STERILE IRR 250ML POUR (IV SOLUTION) ×3 IMPLANT
WIPE NON LINTING 3.25X3.25 (MISCELLANEOUS) ×3 IMPLANT

## 2020-07-26 NOTE — Anesthesia Preprocedure Evaluation (Signed)
Anesthesia Evaluation  Patient identified by MRN, date of birth, ID band Patient awake    Reviewed: Allergy & Precautions, H&P , NPO status , Patient's Chart, lab work & pertinent test results, reviewed documented beta blocker date and time   History of Anesthesia Complications Negative for: history of anesthetic complications  Airway Mallampati: II  TM Distance: >3 FB Neck ROM: full    Dental no notable dental hx.    Pulmonary COPD, Current SmokerPatient did not abstain from smoking.,    Pulmonary exam normal breath sounds clear to auscultation       Cardiovascular Exercise Tolerance: Good hypertension, (-) angina(-) DOE  Rhythm:regular Rate:Normal   HLD   Neuro/Psych    GI/Hepatic PUD, GERD  Controlled,  Endo/Other  diabetes (Pre-DM)  Renal/GU      Musculoskeletal  (+) Arthritis ,   Abdominal   Peds  Hematology  (+) anemia ,   Anesthesia Other Findings   Reproductive/Obstetrics                             Anesthesia Physical  Anesthesia Plan  ASA: II  Anesthesia Plan: MAC   Post-op Pain Management:    Induction: Intravenous  PONV Risk Score and Plan: 1 and Treatment may vary due to age or medical condition  Airway Management Planned: Nasal Cannula  Additional Equipment:   Intra-op Plan:   Post-operative Plan:   Informed Consent: I have reviewed the patients History and Physical, chart, labs and discussed the procedure including the risks, benefits and alternatives for the proposed anesthesia with the patient or authorized representative who has indicated his/her understanding and acceptance.     Dental Advisory Given  Plan Discussed with: CRNA  Anesthesia Plan Comments:         Anesthesia Quick Evaluation

## 2020-07-26 NOTE — H&P (Signed)

## 2020-07-26 NOTE — Anesthesia Postprocedure Evaluation (Signed)
Anesthesia Post Note  Patient: Angelica Walker  Procedure(s) Performed: CATARACT EXTRACTION PHACO AND INTRAOCULAR LENS PLACEMENT (IOC) RIGHT 7.97  00:53.0 (Right Eye)     Patient location during evaluation: PACU Anesthesia Type: MAC Level of consciousness: awake and alert Pain management: pain level controlled Vital Signs Assessment: post-procedure vital signs reviewed and stable Respiratory status: spontaneous breathing, nonlabored ventilation, respiratory function stable and patient connected to nasal cannula oxygen Cardiovascular status: stable and blood pressure returned to baseline Postop Assessment: no apparent nausea or vomiting Anesthetic complications: no   No complications documented.  Marian Grandt A  Lujuana Kapler

## 2020-07-26 NOTE — Anesthesia Procedure Notes (Signed)
Procedure Name: MAC Date/Time: 07/26/2020 9:26 AM Performed by: Jeannene Patella, CRNA Pre-anesthesia Checklist: Patient identified, Emergency Drugs available, Suction available, Timeout performed and Patient being monitored Patient Re-evaluated:Patient Re-evaluated prior to induction Oxygen Delivery Method: Nasal cannula Placement Confirmation: positive ETCO2

## 2020-07-26 NOTE — Transfer of Care (Signed)
Immediate Anesthesia Transfer of Care Note  Patient: Angelica Walker  Procedure(s) Performed: CATARACT EXTRACTION PHACO AND INTRAOCULAR LENS PLACEMENT (IOC) RIGHT 7.97  00:53.0 (Right Eye)  Patient Location: PACU  Anesthesia Type: MAC  Level of Consciousness: awake, alert  and patient cooperative  Airway and Oxygen Therapy: Patient Spontanous Breathing and Patient connected to supplemental oxygen  Post-op Assessment: Post-op Vital signs reviewed, Patient's Cardiovascular Status Stable, Respiratory Function Stable, Patent Airway and No signs of Nausea or vomiting  Post-op Vital Signs: Reviewed and stable  Complications: No complications documented.

## 2020-07-26 NOTE — Op Note (Signed)
OPERATIVE NOTE  Angelica Walker [id number] 07/26/2020   PREOPERATIVE DIAGNOSIS:  Nuclear sclerotic cataract right eye.  H25.11   POSTOPERATIVE DIAGNOSIS:    Nuclear sclerotic cataract right eye.     PROCEDURE:  Phacoemusification with posterior chamber intraocular lens placement of the right eye   LENS:   Implant Name Type Inv. Item Serial No. Manufacturer Lot No. LRB No. Used Action  LENS IOL DIOP 19.5 - [id number] Intraocular Lens LENS IOL DIOP 19.5 [id number] [employer]  Right 1 Implanted       Procedure(s): CATARACT EXTRACTION PHACO AND INTRAOCULAR LENS PLACEMENT (IOC) RIGHT 7.97  00:53.0 (Right)  DCB00 +19.5   ULTRASOUND TIME: 0 minutes 53 seconds.  CDE 7.97   SURGEON:  Benay Pillow, MD, MPH  ANESTHESIOLOGIST: Anesthesiologist: Heniser, Fredric Dine, MD CRNA: Jeannene Patella, CRNA   ANESTHESIA:  Topical with tetracaine drops augmented with 1% preservative-free intracameral lidocaine.  ESTIMATED BLOOD LOSS: less than 1 mL.   COMPLICATIONS:  None.   DESCRIPTION OF PROCEDURE:  The patient was identified in the holding room and transported to the operating room and placed in the supine position under the operating microscope.  The right eye was identified as the operative eye and it was prepped and draped in the usual sterile ophthalmic fashion.   A 1.0 millimeter clear-corneal paracentesis was made at the 10:30 position. 0.5 ml of preservative-free 1% lidocaine with epinephrine was injected into the anterior chamber.  The anterior chamber was filled with Healon 5 viscoelastic.  A 2.4 millimeter keratome was used to make a near-clear corneal incision at the 8:00 position.  A curvilinear capsulorrhexis was made with a cystotome and capsulorrhexis forceps.  Balanced salt solution was used to hydrodissect and hydrodelineate the nucleus.   Phacoemulsification was then used in stop and chop fashion to remove the lens nucleus and epinucleus.  The remaining cortex was  then removed using the irrigation and aspiration handpiece. Healon was then placed into the capsular bag to distend it for lens placement.  A lens was then injected into the capsular bag.  The remaining viscoelastic was aspirated.   Wounds were hydrated with balanced salt solution.  The anterior chamber was inflated to a physiologic pressure with balanced salt solution.   Intracameral vigamox 0.1 mL undiluted was injected into the eye and a drop placed onto the ocular surface.  No wound leaks were noted.  The patient was taken to the recovery room in stable condition without complications of anesthesia or surgery  Benay Pillow 07/26/2020, 9:49 AM

## 2020-07-27 ENCOUNTER — Encounter: Payer: Self-pay | Admitting: Ophthalmology

## 2020-08-27 DIAGNOSIS — Z961 Presence of intraocular lens: Secondary | ICD-10-CM | POA: Diagnosis not present

## 2020-09-15 ENCOUNTER — Ambulatory Visit: Payer: Medicare Other

## 2020-10-04 ENCOUNTER — Ambulatory Visit: Payer: Medicare Other | Attending: Internal Medicine

## 2020-10-04 DIAGNOSIS — Z23 Encounter for immunization: Secondary | ICD-10-CM

## 2020-10-04 NOTE — Progress Notes (Signed)
   Covid-19 Vaccination Clinic  Name:  BROOKLYN ALFREDO    MRN: [id number] DOB: 02-02-1941  10/04/2020  Ms. Guthridge was observed post Covid-19 immunization for 15 minutes without incident. She was provided with Vaccine Information Sheet and instruction to access the V-Safe system.   Ms. Eilert was instructed to call 911 with any severe reactions post vaccine: Marland Kitchen Difficulty breathing  . Swelling of face and throat  . A fast heartbeat  . A bad rash all over body  . Dizziness and weakness

## 2020-10-07 ENCOUNTER — Ambulatory Visit (INDEPENDENT_AMBULATORY_CARE_PROVIDER_SITE_OTHER): Payer: Medicare Other | Admitting: Family Medicine

## 2020-10-07 ENCOUNTER — Other Ambulatory Visit: Payer: Self-pay

## 2020-10-07 ENCOUNTER — Encounter: Payer: Self-pay | Admitting: Family Medicine

## 2020-10-07 VITALS — BP 120/78 | HR 76 | Ht 62.0 in | Wt 128.0 lb

## 2020-10-07 DIAGNOSIS — Z23 Encounter for immunization: Secondary | ICD-10-CM | POA: Diagnosis not present

## 2020-10-07 DIAGNOSIS — G2581 Restless legs syndrome: Secondary | ICD-10-CM

## 2020-10-07 DIAGNOSIS — I10 Essential (primary) hypertension: Secondary | ICD-10-CM

## 2020-10-07 DIAGNOSIS — R0609 Other forms of dyspnea: Secondary | ICD-10-CM

## 2020-10-07 DIAGNOSIS — R0600 Dyspnea, unspecified: Secondary | ICD-10-CM

## 2020-10-07 DIAGNOSIS — K259 Gastric ulcer, unspecified as acute or chronic, without hemorrhage or perforation: Secondary | ICD-10-CM | POA: Diagnosis not present

## 2020-10-07 MED ORDER — GABAPENTIN 100 MG PO CAPS: ORAL_CAPSULE | ORAL | 1 refills | Status: DC

## 2020-10-07 MED ORDER — PANTOPRAZOLE SODIUM 40 MG PO TBEC: DELAYED_RELEASE_TABLET | ORAL | 1 refills | Status: DC

## 2020-10-07 MED ORDER — LISINOPRIL-HYDROCHLOROTHIAZIDE 20-12.5 MG PO TABS: ORAL_TABLET | ORAL | 1 refills | Status: DC

## 2020-10-07 NOTE — Progress Notes (Signed)
Date:  10/07/2020   Name:  Angelica Walker   DOB:  [birth year]   MRN:  [id number]   Chief Complaint: Gastroesophageal Reflux, Hypertension, Peripheral Neuropathy, and Flu Vaccine  Gastroesophageal Reflux She reports no abdominal pain, no belching, no chest pain, no choking, no coughing, no dysphagia, no early satiety, no heartburn, no hoarse voice, no nausea, no sore throat, no stridor or no wheezing. for restless leg. This is a chronic problem. The current episode started more than 1 year ago. The problem has been gradually improving. The symptoms are aggravated by certain foods. Pertinent negatives include no anemia, fatigue, melena, muscle weakness, orthopnea or weight loss. There are no known risk factors. She has tried a PPI for the symptoms. The treatment provided moderate relief.  Hypertension This is a chronic problem. The current episode started more than 1 year ago. The problem has been gradually improving since onset. The problem is controlled. Pertinent negatives include no anxiety, blurred vision, chest pain, headaches, malaise/fatigue, neck pain, orthopnea, palpitations, peripheral edema, PND, shortness of breath or sweats. There are no associated agents to hypertension. Past treatments include ACE inhibitors and diuretics. The current treatment provides moderate improvement. There are no compliance problems.  There is no history of angina, kidney disease, CAD/MI, CVA, heart failure, left ventricular hypertrophy, PVD or retinopathy. There is no history of chronic renal disease, a hypertension causing med or renovascular disease.  Neurologic Problem The patient's pertinent negatives include no weakness. Primary symptoms comment: for restless leg. This is a chronic problem. The current episode started more than 1 year ago. The problem has been gradually improving since onset. There was lower extremity, left-sided and right-sided focality noted. Pertinent negatives include no abdominal pain,  back pain, chest pain, dizziness, fatigue, fever, headaches, light-headedness, nausea, neck pain, palpitations, shortness of breath or vomiting. Treatments tried: gabapentin. The treatment provided mild relief.    Lab Results  Component Value Date   CREATININE 0.61 04/07/2020   BUN 11 04/07/2020   NA 142 04/07/2020   K 4.3 04/07/2020   CL 102 04/07/2020   CO2 26 04/07/2020   Lab Results  Component Value Date   CHOL 186 04/07/2020   HDL 51 04/07/2020   LDLCALC 113 (H) 04/07/2020   TRIG 123 04/07/2020   CHOLHDL 3.5 12/19/2017   Lab Results  Component Value Date   TSH 1.900 12/19/2017   Lab Results  Component Value Date   HGBA1C 5.7 (H) 04/07/2020   Lab Results  Component Value Date   WBC 7.1 07/12/2020   HGB 13.3 07/12/2020   HCT 39.7 07/12/2020   MCV 90.6 07/12/2020   PLT 311 07/12/2020   Lab Results  Component Value Date   ALT 8 12/19/2017   AST 18 12/19/2017   ALKPHOS 87 12/19/2017   BILITOT 0.4 12/19/2017     Review of Systems  Constitutional: Negative.  Negative for chills, fatigue, fever, malaise/fatigue, unexpected weight change and weight loss.  HENT: Negative for congestion, ear discharge, ear pain, hoarse voice, rhinorrhea, sinus pressure, sneezing and sore throat.   Eyes: Negative for blurred vision, photophobia, pain, discharge, redness and itching.  Respiratory: Negative for cough, choking, shortness of breath, wheezing and stridor.   Cardiovascular: Negative for chest pain, palpitations, orthopnea and PND.  Gastrointestinal: Negative for abdominal pain, blood in stool, constipation, diarrhea, dysphagia, heartburn, melena, nausea and vomiting.  Endocrine: Negative for cold intolerance, heat intolerance, polydipsia, polyphagia and polyuria.  Genitourinary: Negative for dysuria, flank pain, frequency, hematuria, menstrual  problem, pelvic pain, urgency, vaginal bleeding and vaginal discharge.  Musculoskeletal: Negative for arthralgias, back pain,  myalgias, muscle weakness and neck pain.  Skin: Negative for rash.  Allergic/Immunologic: Negative for environmental allergies and food allergies.  Neurological: Negative for dizziness, weakness, light-headedness, numbness and headaches.  Hematological: Negative for adenopathy. Does not bruise/bleed easily.  Psychiatric/Behavioral: Negative for dysphoric mood. The patient is not nervous/anxious.     Patient Active Problem List   Diagnosis Date Noted  . Iron deficiency anemia due to chronic blood loss 07/02/2018  . Smoker 03/18/2018  . RLS (restless legs syndrome) 01/18/2018  . Anemia due to blood loss 12/20/2017  . Prediabetes 12/19/2017  . Hyperlipidemia, unspecified 12/19/2017  . Hypertension, essential, benign 12/19/2017  . B12 deficiency 12/19/2017  . DDD (degenerative disc disease), lumbar 12/19/2017  . Multiple gastric ulcers 12/19/2017  . COPD (chronic obstructive pulmonary disease) ([place]) 12/21/2014    Allergies  Allergen Reactions  . Simvastatin Other (See Comments)    Past Surgical History:  Procedure Laterality Date  . CATARACT EXTRACTION W/PHACO Left 06/29/2020   Procedure: CATARACT EXTRACTION PHACO AND INTRAOCULAR LENS PLACEMENT (IOC) LEFT 7.20 00:42.7;  Surgeon: Eulogio Bear, MD;  Location: [place];  Service: Ophthalmology;  Laterality: Left;  . CATARACT EXTRACTION W/PHACO Right 07/26/2020   Procedure: CATARACT EXTRACTION PHACO AND INTRAOCULAR LENS PLACEMENT (IOC) RIGHT 7.97  00:53.0;  Surgeon: Eulogio Bear, MD;  Location: [place];  Service: Ophthalmology;  Laterality: Right;  . COLONOSCOPY WITH PROPOFOL N/A 12/07/2017   Procedure: COLONOSCOPY WITH PROPOFOL;  Surgeon: Lollie Sails, MD;  Location[facility] ENDOSCOPY;  Service: Endoscopy;  Laterality: N/A;  . ESOPHAGOGASTRODUODENOSCOPY (EGD) WITH PROPOFOL N/A 12/07/2017   Procedure: ESOPHAGOGASTRODUODENOSCOPY (EGD) WITH PROPOFOL;  Surgeon: Lollie Sails, MD;  Location[facility]  ENDOSCOPY;  Service: Endoscopy;  Laterality: N/A;  . ESOPHAGOGASTRODUODENOSCOPY (EGD) WITH PROPOFOL N/A 03/21/2018   Procedure: ESOPHAGOGASTRODUODENOSCOPY (EGD) WITH PROPOFOL;  Surgeon: Lollie Sails, MD;  Location[facility] ENDOSCOPY;  Service: Endoscopy;  Laterality: N/A;  . ESOPHAGOGASTRODUODENOSCOPY (EGD) WITH PROPOFOL N/A 07/22/2018   Procedure: ESOPHAGOGASTRODUODENOSCOPY (EGD) WITH PROPOFOL;  Surgeon: Lollie Sails, MD;  Location: [facility] ENDOSCOPY;  Service: Endoscopy;  Laterality: N/A;    Social History   Tobacco Use  . Smoking status: Current Every Day Smoker    Packs/day: 1.00    Years: 54.00    Pack years: 54.00    Types: Cigarettes  . Smokeless tobacco: Never Used  . Tobacco comment: patches and pills discussed   Vaping Use  . Vaping Use: Never used  Substance Use Topics  . Alcohol use: No  . Drug use: No     Medication list has been reviewed and updated.  Current Meds  Medication Sig  . acetaminophen (TYLENOL) 500 MG tablet Take 1,000 mg by mouth 2 (two) times daily as needed.  . Ascorbic Acid (VITAMIN C) 100 MG tablet Take 100 mg by mouth daily.  Marland Kitchen BLACK ELDERBERRY PO Take 1,000 mg by mouth daily.  . Calcium Carbonate-Vitamin D 600-200 MG-UNIT TABS Take by mouth.  . Cyanocobalamin (B-12) 1000 MCG TABS Take 1 tablet by mouth daily.  Marland Kitchen gabapentin (NEURONTIN) 100 MG capsule TAKE (1) CAPSULE BY MOUTH AT BEDTIME  . GLUCOSAMINE-CHONDROITIN PO Take 3 tablets by mouth.  Marland Kitchen lisinopril-hydrochlorothiazide (ZESTORETIC) 20-12.5 MG tablet TAKE (1) TABLET BY MOUTH EVERY DAY  . MELATONIN PO Take by mouth at bedtime as needed.  . pantoprazole (PROTONIX) 40 MG tablet TAKE (1) TABLET BY MOUTH EVERY DAY  PHQ 2/9 Scores 10/07/2020 06/23/2020 04/07/2020 03/19/2019  PHQ - 2 Score 0 0 0 0  PHQ- 9 Score 0 0 0 1    GAD 7 : Generalized Anxiety Score 10/07/2020 06/23/2020 04/07/2020  Nervous, Anxious, on Edge 0 0 0  Control/stop worrying 0 0 0  Worry too much - different things 0 0 0    Trouble relaxing 0 0 0  Restless 0 0 0  Easily annoyed or irritable 0 0 0  Afraid - awful might happen 0 0 0  Total GAD 7 Score 0 0 0    BP Readings from Last 3 Encounters:  10/07/20 120/78  07/26/20 (!) 117/54  06/29/20 (!) 122/55    Physical Exam Vitals and nursing note reviewed.  Constitutional:      General: She is not in acute distress.    Appearance: She is not diaphoretic.  HENT:     Head: Normocephalic and atraumatic.     Right Ear: External ear normal.     Left Ear: External ear normal.     Nose: Nose normal.     Mouth/Throat:     Mouth: Mucous membranes are moist.  Eyes:     General:        Right eye: No discharge.        Left eye: No discharge.     Conjunctiva/sclera: Conjunctivae normal.     Pupils: Pupils are equal, round, and reactive to light.  Neck:     Thyroid: No thyromegaly.     Vascular: No JVD.  Cardiovascular:     Rate and Rhythm: Normal rate and regular rhythm.     Heart sounds: Normal heart sounds. No murmur heard.  No friction rub. No gallop.   Pulmonary:     Effort: Pulmonary effort is normal.     Breath sounds: Normal breath sounds. No wheezing, rhonchi or rales.  Abdominal:     General: Bowel sounds are normal.     Palpations: Abdomen is soft. There is no mass.     Tenderness: There is no abdominal tenderness. There is no guarding.  Musculoskeletal:        General: Normal range of motion.     Cervical back: Normal range of motion and neck supple.  Lymphadenopathy:     Cervical: No cervical adenopathy.  Skin:    General: Skin is warm and dry.  Neurological:     Mental Status: She is alert.     Deep Tendon Reflexes: Reflexes are normal and symmetric.     Wt Readings from Last 3 Encounters:  10/07/20 128 lb (58.1 kg)  07/26/20 128 lb (58.1 kg)  06/29/20 126 lb (57.2 kg)    BP 120/78   Pulse 76   Ht 5\' 2"  (1.575 m)   Wt 128 lb (58.1 kg)   BMI 23.41 kg/m   Assessment and Plan:  1. Hypertension, essential,  benign Chronic.  Controlled.  Stable.  Continue lisinopril hydrochlorothiazide 20/12.5 mg once a day.  Will check renal function panel for electrolytes and GFR. - lisinopril-hydrochlorothiazide (ZESTORETIC) 20-12.5 MG tablet; TAKE (1) TABLET BY MOUTH EVERY DAY  Dispense: 90 tablet; Refill: 1 - Renal Function Panel  2. RLS (restless legs syndrome) Chronic.  Controlled.  Stable.  Patient has feelings of her legs underneath the covers at night.  We will continue gabapentin 100 mg nightly as this seems to control it well. - gabapentin (NEURONTIN) 100 MG capsule; TAKE (1) CAPSULE BY MOUTH AT BEDTIME  Dispense: 90 capsule; Refill:  1  3. Multiple gastric ulcers Chronic.  Controlled.  Stable.  Continue pantoprazole 40 mg once a day. - pantoprazole (PROTONIX) 40 MG tablet; TAKE (1) TABLET BY MOUTH EVERY DAY  Dispense: 90 tablet; Refill: 1  4. Dyspnea on minimal exertion Episodic.  Stable.  No other symptoms or signs on exam suggesting congestive heart failure but we may wait as needed  5. Need for immunization against influenza Gust and administered. - Flu Vaccine QUAD High Dose(Fluad)

## 2020-10-08 LAB — RENAL FUNCTION PANEL
Albumin: 4.3 g/dL (ref 3.7–4.7)
BUN/Creatinine Ratio: 21 (ref 12–28)
BUN: 14 mg/dL (ref 8–27)
CO2: 25 mmol/L (ref 20–29)
Calcium: 9.7 mg/dL (ref 8.7–10.3)
Chloride: 99 mmol/L (ref 96–106)
Creatinine, Ser: 0.68 mg/dL (ref 0.57–1.00)
GFR calc Af Amer: 98 mL/min/{1.73_m2} (ref >59)
GFR calc non Af Amer: 85 mL/min/{1.73_m2} (ref >59)
Glucose: 105 mg/dL — ABNORMAL HIGH (ref 65–99)
Phosphorus: 3.7 mg/dL (ref 3.0–4.3)
Potassium: 4 mmol/L (ref 3.5–5.2)
Sodium: 138 mmol/L (ref 134–144)

## 2020-10-11 ENCOUNTER — Inpatient Hospital Stay: Payer: Medicare Other | Attending: Hematology and Oncology

## 2020-10-11 ENCOUNTER — Other Ambulatory Visit: Payer: Self-pay

## 2020-10-11 DIAGNOSIS — Z803 Family history of malignant neoplasm of breast: Secondary | ICD-10-CM | POA: Diagnosis not present

## 2020-10-11 DIAGNOSIS — F17210 Nicotine dependence, cigarettes, uncomplicated: Secondary | ICD-10-CM | POA: Insufficient documentation

## 2020-10-11 DIAGNOSIS — D509 Iron deficiency anemia, unspecified: Secondary | ICD-10-CM | POA: Diagnosis not present

## 2020-10-11 DIAGNOSIS — Z8711 Personal history of peptic ulcer disease: Secondary | ICD-10-CM | POA: Insufficient documentation

## 2020-10-11 DIAGNOSIS — D500 Iron deficiency anemia secondary to blood loss (chronic): Secondary | ICD-10-CM

## 2020-10-11 LAB — CBC WITH DIFFERENTIAL/PLATELET
Abs Immature Granulocytes: 0.03 10*3/uL (ref 0.00–0.07)
Basophils Absolute: 0.1 10*3/uL (ref 0.0–0.1)
Basophils Relative: 1 %
Eosinophils Absolute: 0.2 10*3/uL (ref 0.0–0.5)
Eosinophils Relative: 2 %
HCT: 38.6 % (ref 36.0–46.0)
Hemoglobin: 12.8 g/dL (ref 12.0–15.0)
Immature Granulocytes: 0 %
Lymphocytes Relative: 26 %
Lymphs Abs: 2 10*3/uL (ref 0.7–4.0)
MCH: 30.2 pg (ref 26.0–34.0)
MCHC: 33.2 g/dL (ref 30.0–36.0)
MCV: 91 fL (ref 80.0–100.0)
Monocytes Absolute: 0.7 10*3/uL (ref 0.1–1.0)
Monocytes Relative: 9 %
Neutro Abs: 4.8 10*3/uL (ref 1.7–7.7)
Neutrophils Relative %: 62 %
Platelets: 302 10*3/uL (ref 150–400)
RBC: 4.24 MIL/uL (ref 3.87–5.11)
RDW: 14.2 % (ref 11.5–15.5)
WBC: 7.6 10*3/uL (ref 4.0–10.5)
nRBC: 0 % (ref 0.0–0.2)

## 2020-10-11 LAB — FERRITIN: Ferritin: 22 ng/mL (ref 11–307)

## 2020-10-11 NOTE — Progress Notes (Signed)
[facility]  [address] [place], [place] [address] Phone: [phone]  Fax: [phone]   Clinic Day:  10/12/2020  Referring physician: Juline Patch, MD  Chief Complaint: Angelica Walker is a 79 y.o. female with iron deficiency anemia who is seen for 6 month assessment.    HPI: The patient was last seen in the hematology clinic on 04/12/2020. At that time, she felt "ok".  She denied any bleeding.  Exam was stable. Hematocrit was 40.4, hemoglobin 13.1, platelets 309,000, WBC 7,700. Ferritin was 47.  Low dose chest CT on 05/07/2020 revealed Lung-RADS 2, benign appearance or behavior.  The patient underwent left cataract extraction on 06/29/2020 and right cataract extraction on 07/26/2020 by Dr. Edison Pace.  Labs followed:  07/12/2020: Hematocrit 39.7, hemoglobin 13,3, platelets 311,000, WBC 7,100. Ferritin 32. 10/11/2020: Hematocrit 38.6, hemoglobin 12.8, platelets 302,000, WBC 7,600. Ferritin 22.  During the interim, she has been "good." She is not very happy with her cataract surgery because she now has to use glasses when she reads. She states that her glasses make her swimmy-headed. She reports back pain. She has intermittent left arm soreness. Denies abdominal pain, blood in the stool, black stools, and hematuria. Her appetite has improved.  She does not remember getting the low dose chest CT scan in 04/2020 and does not remember being told about the chest CT program.   Past Medical History:  Diagnosis Date  . Anemia   . COPD (chronic obstructive pulmonary disease) ([place])   . GERD (gastroesophageal reflux disease)   . Hyperlipidemia   . Hypertension   . Multilevel degenerative disc disease   . Serum lipids high     Past Surgical History:  Procedure Laterality Date  . CATARACT EXTRACTION W/PHACO Left 06/29/2020   Procedure: CATARACT EXTRACTION PHACO AND INTRAOCULAR LENS PLACEMENT (IOC) LEFT 7.20 00:42.7;  Surgeon: Eulogio Bear, MD;   Location: [place];  Service: Ophthalmology;  Laterality: Left;  . CATARACT EXTRACTION W/PHACO Right 07/26/2020   Procedure: CATARACT EXTRACTION PHACO AND INTRAOCULAR LENS PLACEMENT (IOC) RIGHT 7.97  00:53.0;  Surgeon: Eulogio Bear, MD;  Location: [place];  Service: Ophthalmology;  Laterality: Right;  . COLONOSCOPY WITH PROPOFOL N/A 12/07/2017   Procedure: COLONOSCOPY WITH PROPOFOL;  Surgeon: Lollie Sails, MD;  Location: [facility] ENDOSCOPY;  Service: Endoscopy;  Laterality: N/A;  . ESOPHAGOGASTRODUODENOSCOPY (EGD) WITH PROPOFOL N/A 12/07/2017   Procedure: ESOPHAGOGASTRODUODENOSCOPY (EGD) WITH PROPOFOL;  Surgeon: Lollie Sails, MD;  Location: [facility] ENDOSCOPY;  Service: Endoscopy;  Laterality: N/A;  . ESOPHAGOGASTRODUODENOSCOPY (EGD) WITH PROPOFOL N/A 03/21/2018   Procedure: ESOPHAGOGASTRODUODENOSCOPY (EGD) WITH PROPOFOL;  Surgeon: Lollie Sails, MD;  Location: [facility] ENDOSCOPY;  Service: Endoscopy;  Laterality: N/A;  . ESOPHAGOGASTRODUODENOSCOPY (EGD) WITH PROPOFOL N/A 07/22/2018   Procedure: ESOPHAGOGASTRODUODENOSCOPY (EGD) WITH PROPOFOL;  Surgeon: Lollie Sails, MD;  Location: [facility] ENDOSCOPY;  Service: Endoscopy;  Laterality: N/A;    Family History  Problem Relation Age of Onset  . Breast cancer Maternal Aunt     Social History:  reports that she has been smoking cigarettes. She has a 54.00 pack-year smoking history. She has never used smokeless tobacco. She reports that she does not drink alcohol and does not use drugs. She is smoking 1 pack per day for 20 years. She has one child. She used to work in [employer] for 27 years; she is retired. The patient is alone today.  Allergies:  Allergies  Allergen Reactions  . Simvastatin Other (See Comments)  Current Medications: Current Outpatient Medications  Medication Sig Dispense Refill  . acetaminophen (TYLENOL) 500 MG tablet Take 1,000 mg by mouth 2 (two) times daily as needed.    . Ascorbic Acid  (VITAMIN C) 100 MG tablet Take 100 mg by mouth daily.    Marland Kitchen BLACK ELDERBERRY PO Take 1,000 mg by mouth daily.    . Calcium Carbonate-Vitamin D 600-200 MG-UNIT TABS Take by mouth.    . Cyanocobalamin (B-12) 1000 MCG TABS Take 1 tablet by mouth daily. 30 tablet   . gabapentin (NEURONTIN) 100 MG capsule TAKE (1) CAPSULE BY MOUTH AT BEDTIME 90 capsule 1  . GLUCOSAMINE-CHONDROITIN PO Take 3 tablets by mouth.    Marland Kitchen lisinopril-hydrochlorothiazide (ZESTORETIC) 20-12.5 MG tablet TAKE (1) TABLET BY MOUTH EVERY DAY 90 tablet 1  . MELATONIN PO Take by mouth at bedtime as needed.    . pantoprazole (PROTONIX) 40 MG tablet TAKE (1) TABLET BY MOUTH EVERY DAY 90 tablet 1   No current facility-administered medications for this visit.    Review of Systems  Constitutional: Negative for chills, fever, malaise/fatigue and weight loss (up 1 lb).       Feels "good."  HENT: Negative for congestion, ear pain, hearing loss, nosebleeds, sinus pain, sore throat and tinnitus.   Eyes: Negative for blurred vision.       S/p cataract surgery  Respiratory: Negative for cough and hemoptysis.        COPD.  Cardiovascular: Negative.  Negative for chest pain, palpitations and leg swelling.  Gastrointestinal: Negative for abdominal pain, blood in stool, constipation, diarrhea, heartburn, melena, nausea and vomiting.       Appetite improved.  Genitourinary: Negative.  Negative for dysuria, frequency, hematuria and urgency.  Musculoskeletal: Positive for back pain (degenerative disc disease, intermittent). Negative for joint pain, myalgias and neck pain.       Intermittent left arm soreness.  Skin: Negative.  Negative for rash.  Neurological: Negative for dizziness, tingling, sensory change, weakness and headaches.       Glasses make her swimmy-headed.  Endo/Heme/Allergies: Negative.  Does not bruise/bleed easily.  Psychiatric/Behavioral: Negative.  Negative for depression and memory loss. The patient is not nervous/anxious and  does not have insomnia.   All other systems reviewed and are negative.  Performance status (ECOG):  1  Vitals Blood pressure 103/68, pulse 79, temperature (!) 97.4 F (36.3 C), temperature source Tympanic, resp. rate 18, height 5\' 2"  (1.575 m), weight 129 lb 6.6 oz (58.7 kg), SpO2 99 %.   Physical Exam Vitals and nursing note reviewed.  Constitutional:      General: She is not in acute distress.    Appearance: She is well-developed. She is not diaphoretic.  HENT:     Head: Normocephalic and atraumatic.     Comments: Curly dark auburn hair.    Mouth/Throat:     Pharynx: No oropharyngeal exudate.  Eyes:     General: No scleral icterus.    Conjunctiva/sclera: Conjunctivae normal.     Pupils: Pupils are equal, round, and reactive to light.     Comments: Glasses.  Blue eyes.  Neck:     Vascular: No JVD.  Cardiovascular:     Rate and Rhythm: Normal rate and regular rhythm.     Heart sounds: Normal heart sounds. No murmur heard.   Pulmonary:     Effort: Pulmonary effort is normal. No respiratory distress.     Breath sounds: Normal breath sounds. No wheezing or rales.  Chest:  Chest wall: No tenderness.  Abdominal:     General: Bowel sounds are normal. There is no distension.     Palpations: Abdomen is soft. There is no mass.     Tenderness: There is no abdominal tenderness. There is no guarding or rebound.  Musculoskeletal:        General: No tenderness. Normal range of motion.     Cervical back: Normal range of motion and neck supple.     Comments: Arthritic changes in hands.  Lymphadenopathy:     Head:     Right side of head: No preauricular, posterior auricular or occipital adenopathy.     Left side of head: No preauricular, posterior auricular or occipital adenopathy.     Cervical: No cervical adenopathy.     Upper Body:     Right upper body: No supraclavicular or axillary adenopathy.     Left upper body: No supraclavicular or axillary adenopathy.     Lower Body: No  right inguinal adenopathy. No left inguinal adenopathy.  Skin:    General: Skin is warm and dry.     Coloration: Skin is not pale.     Findings: No erythema or rash.  Neurological:     Mental Status: She is alert and oriented to person, place, and time.     Deep Tendon Reflexes: Reflexes are normal and symmetric.  Psychiatric:        Behavior: Behavior normal.        Thought Content: Thought content normal.        Judgment: Judgment normal.    Appointment on 10/11/2020  Component Date Value Ref Range Status  . Ferritin 10/11/2020 22  11 - 307 ng/mL Final   Performed at [facility], [address] [place], [place] [address]  . WBC 10/11/2020 7.6  4.0 - 10.5 K/uL Final  . RBC 10/11/2020 4.24  3.87 - 5.11 MIL/uL Final  . Hemoglobin 10/11/2020 12.8  12.0 - 15.0 g/dL Final  . HCT 10/11/2020 38.6  36 - 46 % Final  . MCV 10/11/2020 91.0  80.0 - 100.0 fL Final  . MCH 10/11/2020 30.2  26.0 - 34.0 pg Final  . MCHC 10/11/2020 33.2  30.0 - 36.0 g/dL Final  . RDW 10/11/2020 14.2  11.5 - 15.5 % Final  . Platelets 10/11/2020 302  150 - 400 K/uL Final  . nRBC 10/11/2020 0.0  0.0 - 0.2 % Final  . Neutrophils Relative % 10/11/2020 62  % Final  . Neutro Abs 10/11/2020 4.8  1.7 - 7.7 K/uL Final  . Lymphocytes Relative 10/11/2020 26  % Final  . Lymphs Abs 10/11/2020 2.0  0.7 - 4.0 K/uL Final  . Monocytes Relative 10/11/2020 9  % Final  . Monocytes Absolute 10/11/2020 0.7  0.1 - 1.0 K/uL Final  . Eosinophils Relative 10/11/2020 2  % Final  . Eosinophils Absolute 10/11/2020 0.2  0.0 - 0.5 K/uL Final  . Basophils Relative 10/11/2020 1  % Final  . Basophils Absolute 10/11/2020 0.1  0.0 - 0.1 K/uL Final  . Immature Granulocytes 10/11/2020 0  % Final  . Abs Immature Granulocytes 10/11/2020 0.03  0.00 - 0.07 K/uL Final   Performed at [facility], [address] [place], [place] [address]    Assessment:  Angelica Walker is a 79 y.o. female with iron deficiency anemia secondary  to gastric ulcer and GI bleed in 03/2018.  She is intolerant of oral iron.  She has received Venofer weekly x  4 (10/08/2018 -10/29/2018), 07/17/2019, and x 3 (01/19/2020 - 02/02/2020).  Ferritin has been followed: 9 on 01/18/2018, 11 on 03/18/2018, 13 on 09/30/2018, 27 on 07/14/2019, 17 on 01/14/2020, 83 on 03/01/2020, 47 on 04/09/2020, 32 on 07/12/2020 and 22 on 10/11/2020.  EGD on 12/07/2017 revealed  Z-line variable.  There was a non-obstructing non-bleeding gastric ulcer with no stigmata of bleeding. The duodenum was normal. Biopsies showed chronic nonspecific gastritis.  There was no H. pylori, dysplasia or malignancy. EGD on 03/21/2018 revealed Z-line variable.  There was erosive gastritis with healing ulceration in the stomach.  There was no H. pylori, dysplasia or malignancy.  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 EGD on 07/22/2018 revealed Z-line variable.  There was non-bleeding gastric ulcer with no stigmata of bleeding.  Acquired deformity in the gastric body (posterior wall).  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     Colonoscopy on 12/07/2017 revealed three 1 to 2 mm polyps in the rectum, removed. Diverticulosis in the sigmoid colon. Tortuous colon. 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  Pathology revealed 3 hyperplastic polyps.  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       She has a smoking history.  Low dose chest CT on 01/21/2019 was Lung-RADS 1.  Low dose chest CT on 05/07/2020 revealed Lung-RADS 2, benign appearance or behavior.  There was a 2.9 mm RUL pulmonary nodule.  She received the [place] COVID-19 booster vaccine on 10/04/2020. She received the high-dose flu vaccine on 10/07/2020.  Symptomatically, she has been "good."  She denies abdominal pain, blood in the  stool, black stools, and hematuria. Appetite has improved.  Exam is stable.  Plan: 1.   Review labs from 10/11/2020. 2.   Iron deficiency anemia  Hematocrit 40.4.  Hemoglobin 13.1.  MCV 91.8 on 04/09/2020.     Ferritin 47.    Hematocrit 38.6.  Hemoglobin 12.8.  MCV 91.0 on 10/11/2020.   Ferritin 22.  She has a history of gastric ulcers, gastritis, and angiectasias.              She denies any bleeding.   Urinalysis today to ensure no arthroscopic hematuria.  She receives Venofer if ferritin < 30.             Venofer today and weekly x1 (total 2). 3.   Health maintenance  She has a smoking history.  Low-dose chest CT on 05/03/2020 revealed lung RADS-2.  Anticipate low-dose chest CT in 04/2021. 4.   Urinalysis today. 5.   Venofer today and weekly x 1 (total 2). 6.   RTC in 3 months or labs (CBC with diff, ferritin) 7.   RTC in 6 months for MD assessment, labs (CBC with diff, ferritin- day before), and +/- Venofer.  I discussed the assessment and treatment plan with the patient.  The patient was provided an opportunity to ask questions and all were answered.  The patient agreed with the plan and demonstrated an understanding of the instructions.  The patient was advised to call back if the symptoms worsen or if the condition fails to improve as anticipated.   Lequita Asal, MD, PhD    10/12/2020, 11:02 AM   I, Mirian Mo Tufford, am acting as a Education administrator for Lequita Asal, MD.  I, Trafford Mike Gip, MD, have reviewed the above documentation for accuracy and  completeness, and I agree with the above.

## 2020-10-12 ENCOUNTER — Encounter: Payer: Self-pay | Admitting: Hematology and Oncology

## 2020-10-12 ENCOUNTER — Inpatient Hospital Stay: Payer: Medicare Other | Admitting: Hematology and Oncology

## 2020-10-12 ENCOUNTER — Inpatient Hospital Stay: Payer: Medicare Other

## 2020-10-12 VITALS — BP 150/69 | HR 78

## 2020-10-12 DIAGNOSIS — D509 Iron deficiency anemia, unspecified: Secondary | ICD-10-CM | POA: Diagnosis not present

## 2020-10-12 DIAGNOSIS — Z8711 Personal history of peptic ulcer disease: Secondary | ICD-10-CM | POA: Diagnosis not present

## 2020-10-12 DIAGNOSIS — D500 Iron deficiency anemia secondary to blood loss (chronic): Secondary | ICD-10-CM

## 2020-10-12 DIAGNOSIS — F17210 Nicotine dependence, cigarettes, uncomplicated: Secondary | ICD-10-CM | POA: Diagnosis not present

## 2020-10-12 DIAGNOSIS — F17200 Nicotine dependence, unspecified, uncomplicated: Secondary | ICD-10-CM | POA: Diagnosis not present

## 2020-10-12 DIAGNOSIS — Z803 Family history of malignant neoplasm of breast: Secondary | ICD-10-CM | POA: Diagnosis not present

## 2020-10-12 MED ORDER — SODIUM CHLORIDE 0.9 % IV SOLN
Freq: Once | INTRAVENOUS | Status: AC
  Filled 2020-10-12: qty 250

## 2020-10-12 MED ORDER — IRON SUCROSE 20 MG/ML IV SOLN
200.0000 mg | Freq: Once | INTRAVENOUS | Status: AC
  Administered 2020-10-12: 200 mg via INTRAVENOUS
  Filled 2020-10-12: qty 10

## 2020-10-12 MED ORDER — SODIUM CHLORIDE 0.9 % IV SOLN: 200.0000 mg | Freq: Once | INTRAVENOUS | Status: DC

## 2020-10-12 NOTE — Progress Notes (Signed)
No new changes noted today 

## 2020-10-19 ENCOUNTER — Other Ambulatory Visit: Payer: Self-pay

## 2020-10-19 ENCOUNTER — Inpatient Hospital Stay: Payer: Medicare Other | Attending: Hematology and Oncology

## 2020-10-19 VITALS — BP 127/78 | HR 78 | Temp 97.0°F | Resp 18

## 2020-10-19 DIAGNOSIS — F17210 Nicotine dependence, cigarettes, uncomplicated: Secondary | ICD-10-CM | POA: Insufficient documentation

## 2020-10-19 DIAGNOSIS — D509 Iron deficiency anemia, unspecified: Secondary | ICD-10-CM | POA: Diagnosis not present

## 2020-10-19 DIAGNOSIS — Z803 Family history of malignant neoplasm of breast: Secondary | ICD-10-CM | POA: Insufficient documentation

## 2020-10-19 DIAGNOSIS — D500 Iron deficiency anemia secondary to blood loss (chronic): Secondary | ICD-10-CM

## 2020-10-19 DIAGNOSIS — Z8711 Personal history of peptic ulcer disease: Secondary | ICD-10-CM | POA: Insufficient documentation

## 2020-10-19 LAB — URINALYSIS, COMPLETE (UACMP) WITH MICROSCOPIC
Bilirubin Urine: NEGATIVE
Glucose, UA: NEGATIVE mg/dL
Hgb urine dipstick: NEGATIVE
Ketones, ur: NEGATIVE mg/dL
Leukocytes,Ua: NEGATIVE
Nitrite: NEGATIVE
Protein, ur: NEGATIVE mg/dL
Specific Gravity, Urine: <1.005 — ABNORMAL LOW (ref 1.005–1.030)
pH: 5.5 (ref 5.0–8.0)

## 2020-10-19 MED ORDER — SODIUM CHLORIDE 0.9 % IV SOLN: 200.0000 mg | Freq: Once | INTRAVENOUS | Status: DC

## 2020-10-19 MED ORDER — IRON SUCROSE 20 MG/ML IV SOLN
200.0000 mg | Freq: Once | INTRAVENOUS | Status: AC
  Administered 2020-10-19: 200 mg via INTRAVENOUS
  Filled 2020-10-19: qty 10

## 2020-10-19 MED ORDER — SODIUM CHLORIDE 0.9 % IV SOLN
Freq: Once | INTRAVENOUS | Status: AC
  Filled 2020-10-19: qty 250

## 2020-10-19 NOTE — Progress Notes (Signed)
Patient received prescribed treatment in clinic; tolerated well. Patient discharged in stable condition.

## 2020-12-13 DIAGNOSIS — I700 Atherosclerosis of aorta: Secondary | ICD-10-CM | POA: Diagnosis not present

## 2020-12-13 DIAGNOSIS — E7800 Pure hypercholesterolemia, unspecified: Secondary | ICD-10-CM | POA: Diagnosis not present

## 2020-12-13 DIAGNOSIS — I10 Essential (primary) hypertension: Secondary | ICD-10-CM | POA: Diagnosis not present

## 2020-12-29 ENCOUNTER — Other Ambulatory Visit: Payer: Self-pay | Admitting: Family Medicine

## 2020-12-29 DIAGNOSIS — Z1231 Encounter for screening mammogram for malignant neoplasm of breast: Secondary | ICD-10-CM

## 2021-01-04 ENCOUNTER — Inpatient Hospital Stay: Payer: Medicare Other

## 2021-01-10 ENCOUNTER — Inpatient Hospital Stay: Payer: Medicare Other | Attending: Hematology and Oncology

## 2021-01-10 ENCOUNTER — Other Ambulatory Visit: Payer: Self-pay

## 2021-01-10 DIAGNOSIS — D500 Iron deficiency anemia secondary to blood loss (chronic): Secondary | ICD-10-CM | POA: Diagnosis not present

## 2021-01-10 LAB — CBC WITH DIFFERENTIAL/PLATELET
Abs Immature Granulocytes: 0.03 10*3/uL (ref 0.00–0.07)
Basophils Absolute: 0.1 10*3/uL (ref 0.0–0.1)
Basophils Relative: 1 %
Eosinophils Absolute: 0.2 10*3/uL (ref 0.0–0.5)
Eosinophils Relative: 2 %
HCT: 38 % (ref 36.0–46.0)
Hemoglobin: 12.7 g/dL (ref 12.0–15.0)
Immature Granulocytes: 0 %
Lymphocytes Relative: 34 %
Lymphs Abs: 2.5 10*3/uL (ref 0.7–4.0)
MCH: 30.7 pg (ref 26.0–34.0)
MCHC: 33.4 g/dL (ref 30.0–36.0)
MCV: 91.8 fL (ref 80.0–100.0)
Monocytes Absolute: 0.8 10*3/uL (ref 0.1–1.0)
Monocytes Relative: 10 %
Neutro Abs: 3.8 10*3/uL (ref 1.7–7.7)
Neutrophils Relative %: 53 %
Platelets: 310 10*3/uL (ref 150–400)
RBC: 4.14 MIL/uL (ref 3.87–5.11)
RDW: 14.4 % (ref 11.5–15.5)
WBC: 7.3 10*3/uL (ref 4.0–10.5)
nRBC: 0 % (ref 0.0–0.2)

## 2021-01-10 LAB — FERRITIN: Ferritin: 52 ng/mL (ref 11–307)

## 2021-01-13 ENCOUNTER — Ambulatory Visit
Admission: RE | Admit: 2021-01-13 | Discharge: 2021-01-13 | Disposition: A | Discharge status: Home / Self Care | Payer: Medicare Other | Source: Ambulatory Visit | Attending: Family Medicine | Admitting: Family Medicine

## 2021-01-13 ENCOUNTER — Other Ambulatory Visit: Payer: Self-pay

## 2021-01-13 DIAGNOSIS — Z1231 Encounter for screening mammogram for malignant neoplasm of breast: Secondary | ICD-10-CM | POA: Insufficient documentation

## 2021-04-07 ENCOUNTER — Encounter: Payer: Self-pay | Admitting: Family Medicine

## 2021-04-07 ENCOUNTER — Other Ambulatory Visit: Payer: Self-pay

## 2021-04-07 ENCOUNTER — Ambulatory Visit (INDEPENDENT_AMBULATORY_CARE_PROVIDER_SITE_OTHER): Payer: Medicare Other | Admitting: Family Medicine

## 2021-04-07 VITALS — BP 130/76 | HR 64 | Ht 62.0 in | Wt 125.0 lb

## 2021-04-07 DIAGNOSIS — G2581 Restless legs syndrome: Secondary | ICD-10-CM | POA: Diagnosis not present

## 2021-04-07 DIAGNOSIS — K259 Gastric ulcer, unspecified as acute or chronic, without hemorrhage or perforation: Secondary | ICD-10-CM

## 2021-04-07 DIAGNOSIS — I10 Essential (primary) hypertension: Secondary | ICD-10-CM

## 2021-04-07 DIAGNOSIS — R739 Hyperglycemia, unspecified: Secondary | ICD-10-CM

## 2021-04-07 MED ORDER — PANTOPRAZOLE SODIUM 40 MG PO TBEC
DELAYED_RELEASE_TABLET | ORAL | 1 refills | Status: DC
Start: 2021-04-07 — End: 2021-09-27

## 2021-04-07 MED ORDER — LISINOPRIL-HYDROCHLOROTHIAZIDE 20-12.5 MG PO TABS: ORAL_TABLET | ORAL | 1 refills | Status: DC

## 2021-04-07 MED ORDER — GABAPENTIN 100 MG PO CAPS
ORAL_CAPSULE | ORAL | 1 refills | Status: DC
Start: 2021-04-07 — End: 2021-07-15

## 2021-04-07 NOTE — Progress Notes (Signed)
Date:  04/07/2021   Name:  Angelica Walker   DOB:  [birth year]   MRN:  [id number]   Chief Complaint: Hypertension, Gastroesophageal Reflux, restless leg, and Prediabetes  Hypertension This is a chronic problem. The current episode started more than 1 year ago. The problem has been gradually improving since onset. The problem is controlled. Pertinent negatives include no anxiety, blurred vision, chest pain, headaches, malaise/fatigue, neck pain, orthopnea, palpitations, peripheral edema, PND, shortness of breath or sweats. There are no associated agents to hypertension. Risk factors for coronary artery disease include dyslipidemia. Past treatments include ACE inhibitors and diuretics. The current treatment provides moderate improvement. There are no compliance problems.  There is no history of angina, kidney disease, CAD/MI, CVA, heart failure, left ventricular hypertrophy or PVD. There is no history of chronic renal disease, a hypertension causing med or renovascular disease.  Gastroesophageal Reflux She reports no abdominal pain, no belching, no chest pain, no choking, no coughing, no dysphagia, no early satiety, no globus sensation, no heartburn, no hoarse voice, no nausea, no sore throat, no stridor, no tooth decay, no water brash or no wheezing. This is a chronic problem. The problem occurs occasionally. The symptoms are aggravated by certain foods. Pertinent negatives include no anemia, fatigue, melena, muscle weakness, orthopnea or weight loss. She has tried a PPI for the symptoms. The treatment provided moderate relief.  Neurologic Problem The patient's pertinent negatives include no altered mental status, clumsiness, focal sensory loss, focal weakness, loss of balance, memory loss, near-syncope, slurred speech, syncope, visual change or weakness. This is a chronic problem. The current episode started more than 1 year ago. The problem has been gradually improving since onset. Pertinent negatives  include no abdominal pain, back pain, chest pain, confusion, diaphoresis, dizziness, fatigue, fever, headaches, light-headedness, nausea, neck pain, palpitations, shortness of breath or vomiting.    Lab Results  Component Value Date   CREATININE 0.68 10/07/2020   BUN 14 10/07/2020   NA 138 10/07/2020   K 4.0 10/07/2020   CL 99 10/07/2020   CO2 25 10/07/2020   Lab Results  Component Value Date   CHOL 186 04/07/2020   HDL 51 04/07/2020   LDLCALC 113 (H) 04/07/2020   TRIG 123 04/07/2020   CHOLHDL 3.5 12/19/2017   Lab Results  Component Value Date   TSH 1.900 12/19/2017   Lab Results  Component Value Date   HGBA1C 5.7 (H) 04/07/2020   Lab Results  Component Value Date   WBC 7.3 01/10/2021   HGB 12.7 01/10/2021   HCT 38.0 01/10/2021   MCV 91.8 01/10/2021   PLT 310 01/10/2021   Lab Results  Component Value Date   ALT 8 12/19/2017   AST 18 12/19/2017   ALKPHOS 87 12/19/2017   BILITOT 0.4 12/19/2017     Review of Systems  Constitutional: Negative.  Negative for chills, diaphoresis, fatigue, fever, malaise/fatigue, unexpected weight change and weight loss.  HENT: Negative for congestion, ear discharge, ear pain, hoarse voice, rhinorrhea, sinus pressure, sneezing and sore throat.   Eyes: Negative for blurred vision, photophobia, pain, discharge, redness and itching.  Respiratory: Negative for cough, choking, shortness of breath, wheezing and stridor.   Cardiovascular: Negative for chest pain, palpitations, orthopnea, PND and near-syncope.  Gastrointestinal: Negative for abdominal pain, blood in stool, constipation, diarrhea, dysphagia, heartburn, melena, nausea and vomiting.  Endocrine: Negative for cold intolerance, heat intolerance, polydipsia, polyphagia and polyuria.  Genitourinary: Negative for dysuria, flank pain, frequency, hematuria, menstrual problem, pelvic pain,  urgency, vaginal bleeding and vaginal discharge.  Musculoskeletal: Negative for arthralgias, back  pain, myalgias, muscle weakness and neck pain.  Skin: Negative for rash.  Allergic/Immunologic: Negative for environmental allergies and food allergies.  Neurological: Negative for dizziness, focal weakness, syncope, weakness, light-headedness, numbness, headaches and loss of balance.  Hematological: Negative for adenopathy. Does not bruise/bleed easily.  Psychiatric/Behavioral: Negative for confusion, dysphoric mood and memory loss. The patient is not nervous/anxious.     Patient Active Problem List   Diagnosis Date Noted  . Iron deficiency anemia due to chronic blood loss 07/02/2018  . Smoker 03/18/2018  . RLS (restless legs syndrome) 01/18/2018  . Anemia due to blood loss 12/20/2017  . Prediabetes 12/19/2017  . Hyperlipidemia, unspecified 12/19/2017  . Hypertension, essential, benign 12/19/2017  . B12 deficiency 12/19/2017  . DDD (degenerative disc disease), lumbar 12/19/2017  . Multiple gastric ulcers 12/19/2017  . COPD (chronic obstructive pulmonary disease) ([place]) 12/21/2014    Allergies  Allergen Reactions  . Simvastatin Other (See Comments)    Past Surgical History:  Procedure Laterality Date  . CATARACT EXTRACTION W/PHACO Left 06/29/2020   Procedure: CATARACT EXTRACTION PHACO AND INTRAOCULAR LENS PLACEMENT (IOC) LEFT 7.20 00:42.7;  Surgeon: Eulogio Bear, MD;  Location: [facility];  Service: Ophthalmology;  Laterality: Left;  . CATARACT EXTRACTION W/PHACO Right 07/26/2020   Procedure: CATARACT EXTRACTION PHACO AND INTRAOCULAR LENS PLACEMENT (IOC) RIGHT 7.97  00:53.0;  Surgeon: Eulogio Bear, MD;  Location: [facility];  Service: Ophthalmology;  Laterality: Right;  . COLONOSCOPY WITH PROPOFOL N/A 12/07/2017   Procedure: COLONOSCOPY WITH PROPOFOL;  Surgeon: Lollie Sails, MD;  Location: [facility] ENDOSCOPY;  Service: Endoscopy;  Laterality: N/A;  . ESOPHAGOGASTRODUODENOSCOPY (EGD) WITH PROPOFOL N/A 12/07/2017   Procedure: ESOPHAGOGASTRODUODENOSCOPY  (EGD) WITH PROPOFOL;  Surgeon: Lollie Sails, MD;  Location[facility] ENDOSCOPY;  Service: Endoscopy;  Laterality: N/A;  . ESOPHAGOGASTRODUODENOSCOPY (EGD) WITH PROPOFOL N/A 03/21/2018   Procedure: ESOPHAGOGASTRODUODENOSCOPY (EGD) WITH PROPOFOL;  Surgeon: Lollie Sails, MD;  Location[facility] ENDOSCOPY;  Service: Endoscopy;  Laterality: N/A;  . ESOPHAGOGASTRODUODENOSCOPY (EGD) WITH PROPOFOL N/A 07/22/2018   Procedure: ESOPHAGOGASTRODUODENOSCOPY (EGD) WITH PROPOFOL;  Surgeon: Lollie Sails, MD;  Location: [facility] ENDOSCOPY;  Service: Endoscopy;  Laterality: N/A;    Social History   Tobacco Use  . Smoking status: Current Every Day Smoker    Packs/day: 1.00    Years: 54.00    Pack years: 54.00    Types: Cigarettes  . Smokeless tobacco: Never Used  . Tobacco comment: patches and pills discussed   Vaping Use  . Vaping Use: Never used  Substance Use Topics  . Alcohol use: No  . Drug use: No     Medication list has been reviewed and updated.  Current Meds  Medication Sig  . acetaminophen (TYLENOL) 500 MG tablet Take 1,000 mg by mouth 2 (two) times daily as needed.  . Ascorbic Acid (VITAMIN C) 100 MG tablet Take 100 mg by mouth daily.  . Calcium Carbonate-Vitamin D 600-200 MG-UNIT TABS Take by mouth.  . Cyanocobalamin (B-12) 1000 MCG TABS Take 1 tablet by mouth daily.  Marland Kitchen gabapentin (NEURONTIN) 100 MG capsule TAKE (1) CAPSULE BY MOUTH AT BEDTIME  . GLUCOSAMINE-CHONDROITIN PO Take 3 tablets by mouth.  Marland Kitchen lisinopril-hydrochlorothiazide (ZESTORETIC) 20-12.5 MG tablet TAKE (1) TABLET BY MOUTH EVERY DAY  . MELATONIN PO Take by mouth at bedtime as needed.  . pantoprazole (PROTONIX) 40 MG tablet TAKE (1) TABLET BY MOUTH EVERY DAY  . [DISCONTINUED] BLACK ELDERBERRY  PO Take 1,000 mg by mouth daily.    PHQ 2/9 Scores 04/07/2021 10/07/2020 06/23/2020 04/07/2020  PHQ - 2 Score 0 0 0 0  PHQ- 9 Score 0 0 0 0    GAD 7 : Generalized Anxiety Score 04/07/2021 10/07/2020 06/23/2020 04/07/2020  Nervous,  Anxious, on Edge 0 0 0 0  Control/stop worrying 0 0 0 0  Worry too much - different things 0 0 0 0  Trouble relaxing 0 0 0 0  Restless 0 0 0 0  Easily annoyed or irritable 0 0 0 0  Afraid - awful might happen 0 0 0 0  Total GAD 7 Score 0 0 0 0    BP Readings from Last 3 Encounters:  04/07/21 130/76  10/19/20 127/78  10/12/20 (!) 150/69    Physical Exam Vitals and nursing note reviewed.  Constitutional:      Appearance: She is well-developed.  HENT:     Head: Normocephalic.     Right Ear: Tympanic membrane, ear canal and external ear normal.     Left Ear: Tympanic membrane, ear canal and external ear normal.     Nose: Nose normal.     Mouth/Throat:     Mouth: Mucous membranes are moist.  Eyes:     General: Lids are everted, no foreign bodies appreciated. No scleral icterus.       Left eye: No foreign body or hordeolum.     Conjunctiva/sclera: Conjunctivae normal.     Right eye: Right conjunctiva is not injected.     Left eye: Left conjunctiva is not injected.     Pupils: Pupils are equal, round, and reactive to light.  Neck:     Thyroid: No thyromegaly.     Vascular: No JVD.     Trachea: No tracheal deviation.  Cardiovascular:     Rate and Rhythm: Normal rate and regular rhythm.     Heart sounds: Normal heart sounds, S1 normal and S2 normal. No murmur heard.  No systolic murmur is present.  No diastolic murmur is present. No friction rub. No gallop. No S3 or S4 sounds.   Pulmonary:     Effort: Pulmonary effort is normal. No respiratory distress.     Breath sounds: Normal breath sounds. No wheezing, rhonchi or rales.  Abdominal:     General: Bowel sounds are normal.     Palpations: Abdomen is soft. There is no mass.     Tenderness: There is no abdominal tenderness. There is no guarding or rebound.  Musculoskeletal:        General: No tenderness. Normal range of motion.     Cervical back: Normal range of motion and neck supple.     Right lower leg: No edema.      Left lower leg: No edema.  Lymphadenopathy:     Cervical: No cervical adenopathy.  Skin:    General: Skin is warm.     Findings: No rash.  Neurological:     Mental Status: She is alert and oriented to person, place, and time.     Cranial Nerves: No cranial nerve deficit.     Deep Tendon Reflexes: Reflexes normal.  Psychiatric:        Mood and Affect: Mood is not anxious or depressed.     Wt Readings from Last 3 Encounters:  04/07/21 125 lb (56.7 kg)  10/12/20 129 lb 6.6 oz (58.7 kg)  10/07/20 128 lb (58.1 kg)    BP 130/76   Pulse 64  Ht 5\' 2"  (1.575 m)   Wt 125 lb (56.7 kg)   BMI 22.86 kg/m   Assessment and Plan: 1. Multiple gastric ulcers Chronic.  Controlled.  Stable.  Patient has a history of gastric ulcers which is controlled with her pantoprazole 40 mg once a day. - pantoprazole (PROTONIX) 40 MG tablet; TAKE (1) TABLET BY MOUTH EVERY DAY  Dispense: 90 tablet; Refill: 1  2. Hypertension, essential, benign Chronic.  Controlled.  Stable.  Blood pressure is 130/76.  We will continue on lisinopril hydrochlorothiazide 20-12.5 mg once a day.  Will check renal function panel for electrolytes and GFR status. - Renal Function Panel - lisinopril-hydrochlorothiazide (ZESTORETIC) 20-12.5 MG tablet; TAKE (1) TABLET BY MOUTH EVERY DAY  Dispense: 90 tablet; Refill: 1  3. RLS (restless legs syndrome) Chronic.  Controlled.  Stable.  Patient is controlled well with 100 mg gabapentin nightly and we will continue with this regimen. - gabapentin (NEURONTIN) 100 MG capsule; TAKE (1) CAPSULE BY MOUTH AT BEDTIME  Dispense: 90 capsule; Refill: 1  4. Hyperglycemia New onset.  Patient's had elevated glucose readings in the most recent past and we will check hemoglobin A1c for ruling out prediabetes/early diabetes. - HgB A1c - Lipid Panel With LDL/HDL Ratio

## 2021-04-08 LAB — RENAL FUNCTION PANEL
Albumin: 4.6 g/dL (ref 3.7–4.7)
BUN/Creatinine Ratio: 15 (ref 12–28)
BUN: 12 mg/dL (ref 8–27)
CO2: 24 mmol/L (ref 20–29)
Calcium: 9.8 mg/dL (ref 8.7–10.3)
Chloride: 100 mmol/L (ref 96–106)
Creatinine, Ser: 0.78 mg/dL (ref 0.57–1.00)
Glucose: 99 mg/dL (ref 65–99)
Phosphorus: 3.5 mg/dL (ref 3.0–4.3)
Potassium: 4.1 mmol/L (ref 3.5–5.2)
Sodium: 140 mmol/L (ref 134–144)
eGFR: 78 mL/min/{1.73_m2} (ref >59)

## 2021-04-08 LAB — LIPID PANEL WITH LDL/HDL RATIO
Cholesterol, Total: 195 mg/dL (ref 100–199)
HDL: 47 mg/dL (ref >39)
LDL Chol Calc (NIH): 121 mg/dL — ABNORMAL HIGH (ref 0–99)
LDL/HDL Ratio: 2.6 ratio (ref 0.0–3.2)
Triglycerides: 150 mg/dL — ABNORMAL HIGH (ref 0–149)
VLDL Cholesterol Cal: 27 mg/dL (ref 5–40)

## 2021-04-08 LAB — HEMOGLOBIN A1C
Est. average glucose Bld gHb Est-mCnc: 114 mg/dL
Hgb A1c MFr Bld: 5.6 % (ref 4.8–5.6)

## 2021-04-11 ENCOUNTER — Other Ambulatory Visit: Payer: Self-pay

## 2021-04-11 ENCOUNTER — Inpatient Hospital Stay: Payer: Medicare Other

## 2021-04-11 DIAGNOSIS — D500 Iron deficiency anemia secondary to blood loss (chronic): Secondary | ICD-10-CM | POA: Insufficient documentation

## 2021-04-11 LAB — CBC WITH DIFFERENTIAL/PLATELET
Abs Immature Granulocytes: 0.02 10*3/uL (ref 0.00–0.07)
Basophils Absolute: 0.1 10*3/uL (ref 0.0–0.1)
Basophils Relative: 2 %
Eosinophils Absolute: 0.2 10*3/uL (ref 0.0–0.5)
Eosinophils Relative: 3 %
HCT: 37.6 % (ref 36.0–46.0)
Hemoglobin: 12.6 g/dL (ref 12.0–15.0)
Immature Granulocytes: 0 %
Lymphocytes Relative: 39 %
Lymphs Abs: 2.4 10*3/uL (ref 0.7–4.0)
MCH: 30.4 pg (ref 26.0–34.0)
MCHC: 33.5 g/dL (ref 30.0–36.0)
MCV: 90.8 fL (ref 80.0–100.0)
Monocytes Absolute: 0.7 10*3/uL (ref 0.1–1.0)
Monocytes Relative: 12 %
Neutro Abs: 2.8 10*3/uL (ref 1.7–7.7)
Neutrophils Relative %: 44 %
Platelets: 301 10*3/uL (ref 150–400)
RBC: 4.14 MIL/uL (ref 3.87–5.11)
RDW: 13.7 % (ref 11.5–15.5)
WBC: 6.3 10*3/uL (ref 4.0–10.5)
nRBC: 0 % (ref 0.0–0.2)

## 2021-04-11 LAB — FERRITIN: Ferritin: 28 ng/mL (ref 11–307)

## 2021-04-12 ENCOUNTER — Inpatient Hospital Stay: Payer: Medicare Other | Attending: Hematology and Oncology | Admitting: Oncology

## 2021-04-12 ENCOUNTER — Inpatient Hospital Stay: Payer: Medicare Other

## 2021-04-12 ENCOUNTER — Encounter: Payer: Self-pay | Admitting: Oncology

## 2021-04-12 VITALS — BP 103/70 | HR 87 | Temp 99.0°F | Resp 16 | Wt 118.4 lb

## 2021-04-12 VITALS — BP 161/68 | HR 78

## 2021-04-12 DIAGNOSIS — D500 Iron deficiency anemia secondary to blood loss (chronic): Secondary | ICD-10-CM | POA: Diagnosis not present

## 2021-04-12 MED ORDER — SODIUM CHLORIDE 0.9 % IV SOLN: 200.0000 mg | Freq: Once | INTRAVENOUS | Status: DC

## 2021-04-12 MED ORDER — IRON SUCROSE 20 MG/ML IV SOLN
200.0000 mg | Freq: Once | INTRAVENOUS | Status: AC
  Administered 2021-04-12: 200 mg via INTRAVENOUS
  Filled 2021-04-12: qty 10

## 2021-04-12 MED ORDER — SODIUM CHLORIDE 0.9 % IV SOLN
Freq: Once | INTRAVENOUS | Status: AC
  Filled 2021-04-12: qty 250

## 2021-04-12 NOTE — Progress Notes (Signed)
[facility]  [address] [place], [place] [address] Phone: [phone]  Fax: [phone]   Clinic Day:  04/12/2021  Referring physician: Juline Patch, MD  Chief Complaint: AALEIGHA BOZZA is a 80 y.o. female with iron deficiency anemia who is seen for 6 month assessment.    HPI: The patient was last seen in the hematology clinic on 10/12/2020. At that time, she has been "good." She is not very happy with her cataract surgery because she now has to use glasses when she reads. She states that her glasses make her swimmy-headed. She reports back pain. She has intermittent left arm soreness. Denies abdominal pain, blood in the stool, black stools, and hematuria. Her appetite has improved.  She does not remember getting the low dose chest CT scan in 04/2020 and does not remember being told about the LDCT program.  Low dose chest CT on 05/07/2020 revealed Lung-RADS 2, benign appearance or behavior.  She was seen by Dr. Ronnald Ramp on 04/07/2021 for chronic comorbidities including hypertension, GERD and neurological problem.  She was instructed to continue Protonix, senna Pearl hydrochlorothiazide and gabapentin for restless leg syndrome.  During the interim, she has done well.  She admits to some worsening fatigue and feels that she may need some IV iron.  She is unable to tolerate oral iron.  Denies any bleeding.  Past Medical History:  Diagnosis Date  . Anemia   . COPD (chronic obstructive pulmonary disease) ([place])   . GERD (gastroesophageal reflux disease)   . Hyperlipidemia   . Hypertension   . Multilevel degenerative disc disease   . Serum lipids high     Past Surgical History:  Procedure Laterality Date  . CATARACT EXTRACTION W/PHACO Left 06/29/2020   Procedure: CATARACT EXTRACTION PHACO AND INTRAOCULAR LENS PLACEMENT (IOC) LEFT 7.20 00:42.7;  Surgeon: Eulogio Bear, MD;  Location: [place];  Service: Ophthalmology;  Laterality:  Left;  . CATARACT EXTRACTION W/PHACO Right 07/26/2020   Procedure: CATARACT EXTRACTION PHACO AND INTRAOCULAR LENS PLACEMENT (IOC) RIGHT 7.97  00:53.0;  Surgeon: Eulogio Bear, MD;  Location: [place];  Service: Ophthalmology;  Laterality: Right;  . COLONOSCOPY WITH PROPOFOL N/A 12/07/2017   Procedure: COLONOSCOPY WITH PROPOFOL;  Surgeon: Lollie Sails, MD;  Location: [facility] ENDOSCOPY;  Service: Endoscopy;  Laterality: N/A;  . ESOPHAGOGASTRODUODENOSCOPY (EGD) WITH PROPOFOL N/A 12/07/2017   Procedure: ESOPHAGOGASTRODUODENOSCOPY (EGD) WITH PROPOFOL;  Surgeon: Lollie Sails, MD;  Location[facility] END[facility]COPY;  Service: Endoscopy;  Laterality: N/A;  . ESOPHAGOGASTRODUODENOSCOPY (EGD) WITH PROPOFOL N/A 03/21/2018   Procedure: ESOPHAGOGASTRODUODENOSCOPY (EGD) WITH PROPOFOL;  Surgeon: Lollie Sails, MD;  Location: [facility] ENDOSCOPY;  Service: Endoscopy;  Laterality: N/A;  . ESOPHAGOGASTRODUODENOSCOPY (EGD) WITH PROPOFOL N/A 07/22/2018   Procedure: ESOPHAGOGASTRODUODENOSCOPY (EGD) WITH PROPOFOL;  Surgeon: Lollie Sails, MD;  Location: [facility] ENDOSCOPY;  Service: Endoscopy;  Laterality: N/A;    Family History  Problem Relation Age of Onset  . Breast cancer Maternal Aunt     Social History:  reports that she has been smoking cigarettes. She has a 54.00 pack-year smoking history. She has never used smokeless tobacco. She reports that she does not drink alcohol and does not use drugs. She is smoking 1 pack per day for 20 years. She has one child. She used to work in [employer] for 27 years; she is retired. The patient is alone today.  Allergies:  Allergies  Allergen Reactions  . Simvastatin Other (See Comments)    Current Medications:  Current Outpatient Medications  Medication Sig Dispense Refill  . acetaminophen (TYLENOL) 500 MG tablet Take 1,000 mg by mouth 2 (two) times daily as needed.    . Ascorbic Acid (VITAMIN C) 100 MG tablet Take 100 mg by mouth daily.    . Calcium  Carbonate-Vitamin D 600-200 MG-UNIT TABS Take by mouth.    . Cyanocobalamin (B-12) 1000 MCG TABS Take 1 tablet by mouth daily. 30 tablet   . gabapentin (NEURONTIN) 100 MG capsule TAKE (1) CAPSULE BY MOUTH AT BEDTIME 90 capsule 1  . GLUCOSAMINE-CHONDROITIN PO Take 3 tablets by mouth.    Marland Kitchen lisinopril-hydrochlorothiazide (ZESTORETIC) 20-12.5 MG tablet TAKE (1) TABLET BY MOUTH EVERY DAY 90 tablet 1  . Melatonin 10 MG TABS Take 1 tablet by mouth at bedtime.    Marland Kitchen MELATONIN PO Take by mouth at bedtime as needed.    . pantoprazole (PROTONIX) 40 MG tablet TAKE (1) TABLET BY MOUTH EVERY DAY 90 tablet 1   No current facility-administered medications for this visit.    Review of Systems  Constitutional: Positive for malaise/fatigue. Negative for chills, fever and weight loss.  HENT: Negative for congestion, ear pain and tinnitus.   Eyes: Negative.  Negative for blurred vision and double vision.  Respiratory: Positive for cough. Negative for sputum production and shortness of breath.   Cardiovascular: Negative.  Negative for chest pain, palpitations and leg swelling.  Gastrointestinal: Negative.  Negative for abdominal pain, constipation, diarrhea, nausea and vomiting.  Genitourinary: Negative for dysuria, frequency and urgency.  Musculoskeletal: Negative for back pain and falls.  Skin: Negative.  Negative for rash.  Neurological: Negative.  Negative for weakness and headaches.  Endo/Heme/Allergies: Negative.  Does not bruise/bleed easily.  Psychiatric/Behavioral: Negative.  Negative for depression. The patient is not nervous/anxious and does not have insomnia.    Performance status (ECOG):  1  Vitals Blood pressure 103/70, pulse 87, temperature 99 F (37.2 C), resp. rate 16, weight 118 lb 6.2 oz (53.7 kg), SpO2 97 %.   Physical Exam Vitals and nursing note reviewed.  Constitutional:      General: She is not in acute distress.    Appearance: She is well-developed. She is not diaphoretic.   HENT:     Head: Normocephalic and atraumatic.     Comments: Curly dark auburn hair.    Mouth/Throat:     Pharynx: No oropharyngeal exudate.  Eyes:     General: No scleral icterus.    Conjunctiva/sclera: Conjunctivae normal.     Pupils: Pupils are equal, round, and reactive to light.     Comments: Glasses.  Blue eyes.  Neck:     Vascular: No JVD.  Cardiovascular:     Rate and Rhythm: Normal rate and regular rhythm.     Heart sounds: Normal heart sounds. No murmur heard.   Pulmonary:     Effort: Pulmonary effort is normal. No respiratory distress.     Breath sounds: Normal breath sounds. No wheezing or rales.  Chest:     Chest wall: No tenderness.  Breasts:     Right: No axillary adenopathy or supraclavicular adenopathy.     Left: No axillary adenopathy or supraclavicular adenopathy.    Abdominal:     General: Bowel sounds are normal. There is no distension.     Palpations: Abdomen is soft. There is no mass.     Tenderness: There is no abdominal tenderness. There is no guarding or rebound.  Musculoskeletal:        General: No tenderness.  Normal range of motion.     Cervical back: Normal range of motion and neck supple.     Comments: Arthritic changes in hands.  Lymphadenopathy:     Head:     Right side of head: No preauricular, posterior auricular or occipital adenopathy.     Left side of head: No preauricular, posterior auricular or occipital adenopathy.     Cervical: No cervical adenopathy.     Upper Body:     Right upper body: No supraclavicular or axillary adenopathy.     Left upper body: No supraclavicular or axillary adenopathy.     Lower Body: No right inguinal adenopathy. No left inguinal adenopathy.  Skin:    General: Skin is warm and dry.     Coloration: Skin is not pale.     Findings: No erythema or rash.  Neurological:     Mental Status: She is alert and oriented to person, place, and time.     Deep Tendon Reflexes: Reflexes are normal and symmetric.   Psychiatric:        Behavior: Behavior normal.        Thought Content: Thought content normal.        Judgment: Judgment normal.    Appointment on 04/11/2021  Component Date Value Ref Range Status  . WBC 04/11/2021 6.3  4.0 - 10.5 K/uL Final  . RBC 04/11/2021 4.14  3.87 - 5.11 MIL/uL Final  . Hemoglobin 04/11/2021 12.6  12.0 - 15.0 g/dL Final  . HCT 04/11/2021 37.6  36.0 - 46.0 % Final  . MCV 04/11/2021 90.8  80.0 - 100.0 fL Final  . MCH 04/11/2021 30.4  26.0 - 34.0 pg Final  . MCHC 04/11/2021 33.5  30.0 - 36.0 g/dL Final  . RDW 04/11/2021 13.7  11.5 - 15.5 % Final  . Platelets 04/11/2021 301  150 - 400 K/uL Final  . nRBC 04/11/2021 0.0  0.0 - 0.2 % Final  . Neutrophils Relative % 04/11/2021 44  % Final  . Neutro Abs 04/11/2021 2.8  1.7 - 7.7 K/uL Final  . Lymphocytes Relative 04/11/2021 39  % Final  . Lymphs Abs 04/11/2021 2.4  0.7 - 4.0 K/uL Final  . Monocytes Relative 04/11/2021 12  % Final  . Monocytes Absolute 04/11/2021 0.7  0.1 - 1.0 K/uL Final  . Eosinophils Relative 04/11/2021 3  % Final  . Eosinophils Absolute 04/11/2021 0.2  0.0 - 0.5 K/uL Final  . Basophils Relative 04/11/2021 2  % Final  . Basophils Absolute 04/11/2021 0.1  0.0 - 0.1 K/uL Final  . Immature Granulocytes 04/11/2021 0  % Final  . Abs Immature Granulocytes 04/11/2021 0.02  0.00 - 0.07 K/uL Final   Performed at [facility], [address], [place], [place] [address]  . Ferritin 04/11/2021 28  11 - 307 ng/mL Final   Performed at [facility], [address] [place], [place] [address]    Assessment:  GAURI GALVAO is a 80 y.o. female with iron deficiency anemia secondary to gastric ulcer and GI bleed in 03/2018.  She is intolerant of oral iron.  She has received Venofer weekly x 4 (10/08/2018 -10/29/2018), 07/17/2019, and x 3 (01/19/2020 - 02/02/2020).  Ferritin has been followed: 9 on 01/18/2018, 11 on 03/18/2018, 13 on 09/30/2018, 27 on 07/14/2019, 17 on 01/14/2020, 83 on  03/01/2020, 47 on 04/09/2020, 32 on 07/12/2020 and 22 on 10/11/2020.  EGD on 12/07/2017 revealed  Z-line variable.  There was a non-obstructing non-bleeding gastric ulcer  with no stigmata of bleeding. The duodenum was normal. Biopsies showed chronic nonspecific gastritis.  There was no H. pylori, dysplasia or malignancy. EGD on 03/21/2018 revealed Z-line variable.  There was erosive gastritis with healing ulceration in the stomach.  There was no H. pylori, dysplasia or malignancy.  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 EGD on 07/22/2018 revealed Z-line variable.  There was non-bleeding gastric ulcer with no stigmata of bleeding.  Acquired deformity in the gastric body (posterior wall).  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     Colonoscopy on 12/07/2017 revealed three 1 to 2 mm polyps in the rectum, removed. Diverticulosis in the sigmoid colon. Tortuous colon. 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  Pathology revealed 3 hyperplastic polyps.  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       She has a smoking history.  Low dose chest CT on 01/21/2019 was Lung-RADS 1.  Low dose chest CT on 05/07/2020 revealed Lung-RADS 2, benign appearance or behavior.  There was a 2.9 mm RUL pulmonary nodule.  She received the [facility] COVID-19 booster vaccine on 10/04/2020. She received the high-dose flu vaccine on 10/07/2020.  Symptomatically, she has been "good."  She denies abdominal pain, blood in the stool, black stools, and hematuria. Appetite has improved.  Exam is stable.  Plan: 1.   Review labs from 04/11/2021 2.   Iron deficiency anemia  -She has a history of gastric ulcers, gastritis, and angiectasias.  -Unable to tolerate oral iron.             -She denies any active bleeding.  -She receives Venofer  if ferritin < 30.             -Venofer today and twice per week for a total of 4 doses.   -Ferritin today is 28, hemoglobin 12.6 on 04/12/21. 3.   Health maintenance  -She has a smoking history.  -Low-dose chest CT on 05/03/2020 revealed lung RADS-2.  -Anticipate low-dose chest CT in 04/2021.  Plan:  -Proceed with IV Venofer today.  She will need a total of 4 doses of IV Venofer.  -RTC in 3 months or labs (CBC with diff, ferritin) -RTC in 6 months for MD assessment, labs (CBC with diff, ferritin- day before), and +/- Venofer.  I discussed the assessment and treatment plan with the patient.  The patient was provided an opportunity to ask questions and all were answered.  The patient agreed with the plan and demonstrated an understanding of the instructions.  The patient was advised to call back if the symptoms worsen or if the condition fails to improve as anticipated.  Greater than 50% was spent in counseling and coordination of care with this patient including but not limited to discussion of the relevant topics above (See A&P) including, but not limited to diagnosis and management of acute and chronic medical conditions.   Faythe Casa, NP 04/12/2021 2:03 PM

## 2021-04-19 ENCOUNTER — Inpatient Hospital Stay: Payer: Medicare Other

## 2021-04-21 ENCOUNTER — Other Ambulatory Visit: Payer: Self-pay

## 2021-04-21 ENCOUNTER — Inpatient Hospital Stay: Payer: Medicare Other | Attending: Nurse Practitioner

## 2021-04-21 VITALS — BP 156/71 | HR 78 | Temp 97.8°F | Resp 16

## 2021-04-21 DIAGNOSIS — D500 Iron deficiency anemia secondary to blood loss (chronic): Secondary | ICD-10-CM | POA: Insufficient documentation

## 2021-04-21 MED ORDER — IRON SUCROSE 20 MG/ML IV SOLN
200.0000 mg | Freq: Once | INTRAVENOUS | Status: AC
Start: 2021-04-21 — End: 2021-04-21
  Administered 2021-04-21: 200 mg via INTRAVENOUS
  Filled 2021-04-21: qty 10

## 2021-04-21 MED ORDER — SODIUM CHLORIDE 0.9 % IV SOLN: 200.0000 mg | Freq: Once | INTRAVENOUS | Status: DC

## 2021-04-21 MED ORDER — SODIUM CHLORIDE 0.9 % IV SOLN
Freq: Once | INTRAVENOUS | Status: AC
  Filled 2021-04-21: qty 250

## 2021-04-26 ENCOUNTER — Inpatient Hospital Stay: Payer: Medicare Other

## 2021-05-03 ENCOUNTER — Inpatient Hospital Stay: Payer: Medicare Other

## 2021-05-11 ENCOUNTER — Telehealth: Payer: Self-pay

## 2021-05-11 NOTE — Telephone Encounter (Signed)
Left message for patient to notify them that it is time to schedule annual low dose lung cancer screening CT scan. Instructed patient to call back ([phone]) to verify information and schedule.  

## 2021-05-17 ENCOUNTER — Inpatient Hospital Stay: Payer: Medicare Other

## 2021-05-18 ENCOUNTER — Encounter: Payer: Self-pay | Admitting: Internal Medicine

## 2021-05-25 DIAGNOSIS — L821 Other seborrheic keratosis: Secondary | ICD-10-CM | POA: Diagnosis not present

## 2021-05-25 DIAGNOSIS — Z872 Personal history of diseases of the skin and subcutaneous tissue: Secondary | ICD-10-CM | POA: Diagnosis not present

## 2021-05-25 DIAGNOSIS — Z859 Personal history of malignant neoplasm, unspecified: Secondary | ICD-10-CM | POA: Diagnosis not present

## 2021-05-25 DIAGNOSIS — L570 Actinic keratosis: Secondary | ICD-10-CM | POA: Diagnosis not present

## 2021-05-25 DIAGNOSIS — L578 Other skin changes due to chronic exposure to nonionizing radiation: Secondary | ICD-10-CM | POA: Diagnosis not present

## 2021-06-14 ENCOUNTER — Inpatient Hospital Stay: Payer: Medicare Other

## 2021-06-15 ENCOUNTER — Telehealth: Payer: Self-pay

## 2021-06-15 NOTE — Telephone Encounter (Signed)
Left message for patient to notify them that it is time to schedule annual low dose lung cancer screening CT scan. Instructed patient to call back ([phone]) to verify information and schedule.  

## 2021-06-15 NOTE — Telephone Encounter (Signed)
I put this patient on the schedule for annual lung screening CT scan for July 6th at 3:00 but then she stated that she wanted to go to [place]. Please reach out.

## 2021-06-16 ENCOUNTER — Other Ambulatory Visit: Payer: Self-pay | Admitting: *Deleted

## 2021-06-16 DIAGNOSIS — Z122 Encounter for screening for malignant neoplasm of respiratory organs: Secondary | ICD-10-CM

## 2021-06-16 DIAGNOSIS — Z87891 Personal history of nicotine dependence: Secondary | ICD-10-CM

## 2021-06-16 DIAGNOSIS — F17200 Nicotine dependence, unspecified, uncomplicated: Secondary | ICD-10-CM

## 2021-06-16 NOTE — Progress Notes (Signed)
Contacted and will be scheduled for annual lung screening scan. Patient is a current smoker with a 57 pack year history.

## 2021-06-30 ENCOUNTER — Ambulatory Visit
Admission: RE | Admit: 2021-06-30 | Discharge: 2021-06-30 | Disposition: A | Discharge status: Home / Self Care | Payer: Medicare Other | Source: Ambulatory Visit | Attending: Oncology | Admitting: Oncology

## 2021-06-30 ENCOUNTER — Other Ambulatory Visit: Payer: Self-pay

## 2021-06-30 DIAGNOSIS — K8020 Calculus of gallbladder without cholecystitis without obstruction: Secondary | ICD-10-CM | POA: Diagnosis not present

## 2021-06-30 DIAGNOSIS — J439 Emphysema, unspecified: Secondary | ICD-10-CM | POA: Insufficient documentation

## 2021-06-30 DIAGNOSIS — Z87891 Personal history of nicotine dependence: Secondary | ICD-10-CM

## 2021-06-30 DIAGNOSIS — Z122 Encounter for screening for malignant neoplasm of respiratory organs: Secondary | ICD-10-CM | POA: Diagnosis not present

## 2021-06-30 DIAGNOSIS — I700 Atherosclerosis of aorta: Secondary | ICD-10-CM | POA: Diagnosis not present

## 2021-06-30 DIAGNOSIS — F17210 Nicotine dependence, cigarettes, uncomplicated: Secondary | ICD-10-CM | POA: Diagnosis not present

## 2021-06-30 DIAGNOSIS — F17200 Nicotine dependence, unspecified, uncomplicated: Secondary | ICD-10-CM

## 2021-07-12 ENCOUNTER — Other Ambulatory Visit: Payer: Medicare Other

## 2021-07-15 ENCOUNTER — Other Ambulatory Visit: Payer: Self-pay | Admitting: Family Medicine

## 2021-07-15 DIAGNOSIS — G2581 Restless legs syndrome: Secondary | ICD-10-CM

## 2021-07-15 DIAGNOSIS — I10 Essential (primary) hypertension: Secondary | ICD-10-CM

## 2021-08-27 ENCOUNTER — Telehealth: Payer: Self-pay | Admitting: *Deleted

## 2021-08-27 NOTE — Telephone Encounter (Signed)
Called patient to see if she wants to schedule and complete her AWV for Medicare. Staff spoke with patient and she declined. Per pt she don't do that.

## 2021-09-07 IMAGING — CT CT CHEST LUNG CANCER SCREENING LOW DOSE W/O CM
1 series · 10 of 10 positions shown, 13 images · non-contrast
Comparison: 05/07/2020

CLINICAL DATA: 80-year-old female with 54 pack-year history of
smoking. Lung cancer screening.

EXAM:
CT CHEST WITHOUT CONTRAST LOW-DOSE FOR LUNG CANCER SCREENING
TECHNIQUE: Multidetector CT imaging of the chest was performed following the
standard protocol without IV contrast.

[ct lung segmentation data · axial · 0.62mm/px · z∈[-758,-758]mm · 10 of 321 frames shown]
[frame 1/321  mediastinal]
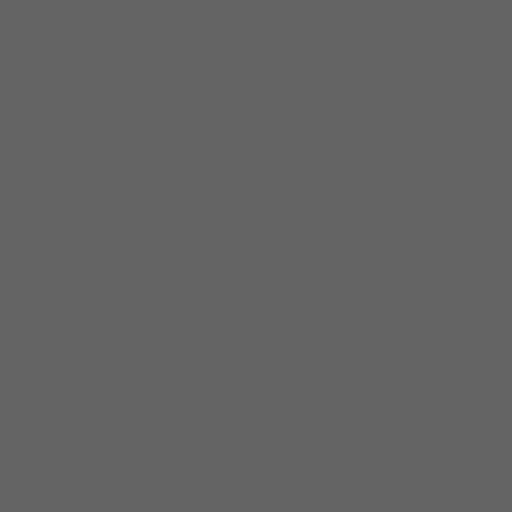
[frame 1/321  lung]
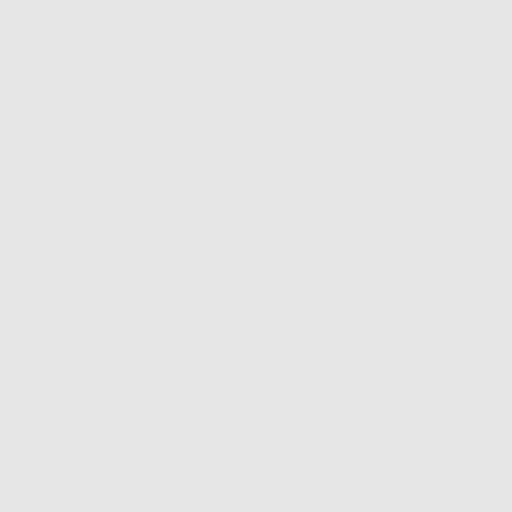
[frame 36/321  lung]
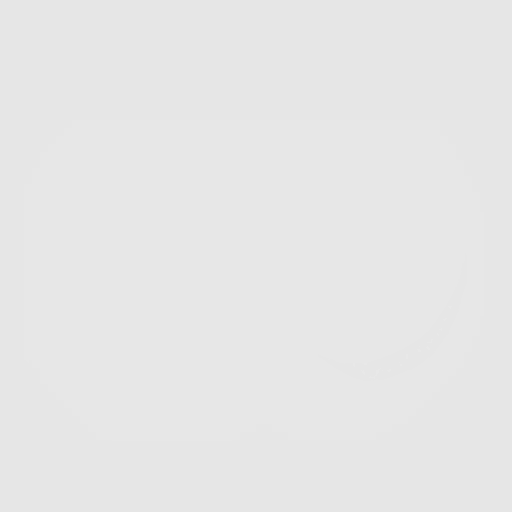
[frame 72/321  lung]
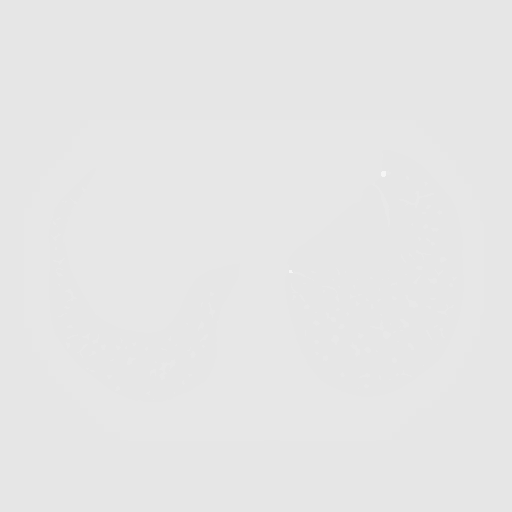
[frame 107/321  lung]
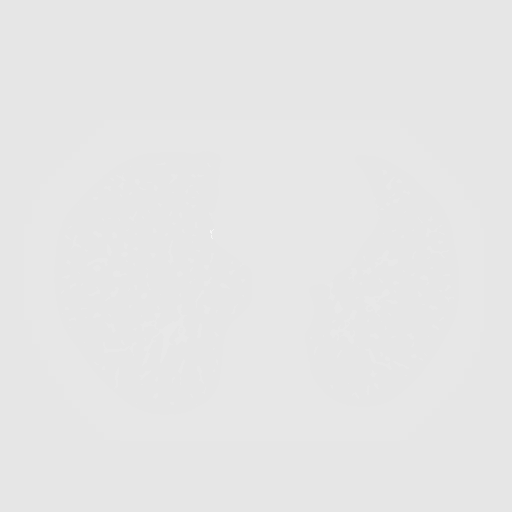
[frame 143/321  mediastinal]
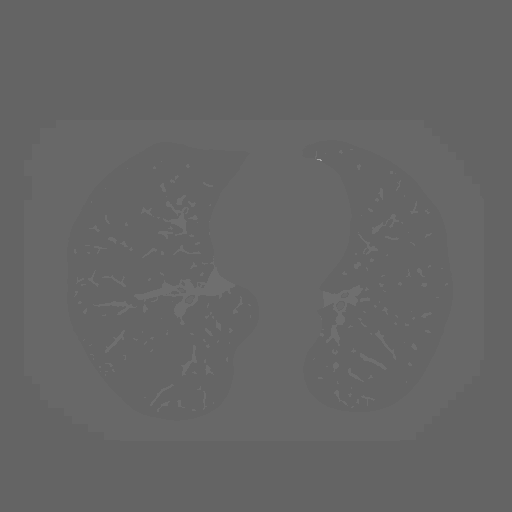
[frame 143/321  lung]
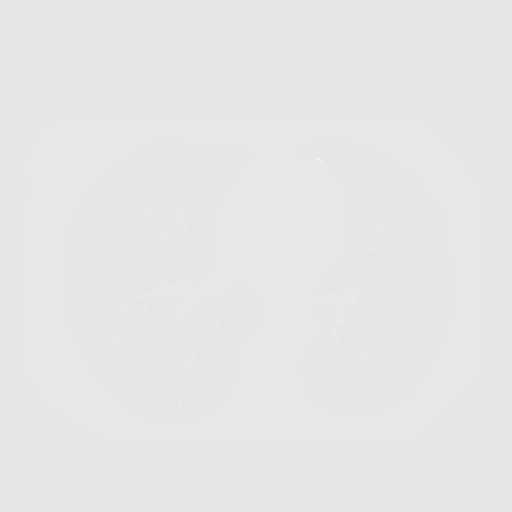
[frame 178/321  lung]
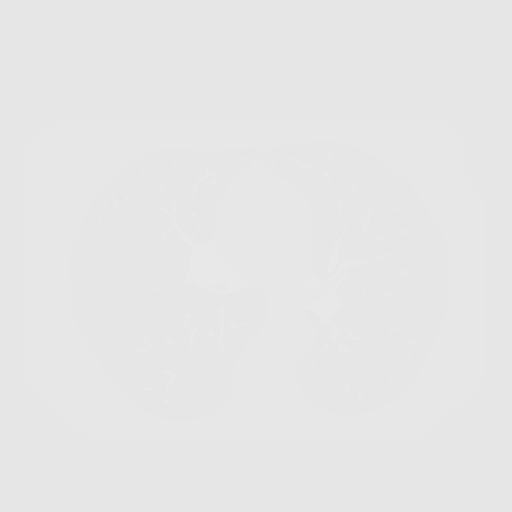
[frame 214/321  lung]
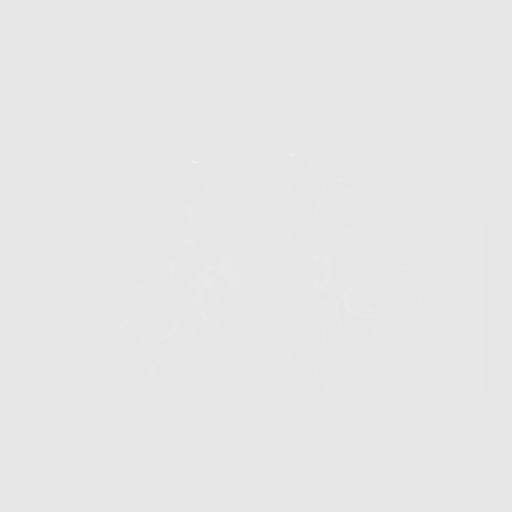
[frame 249/321  lung]
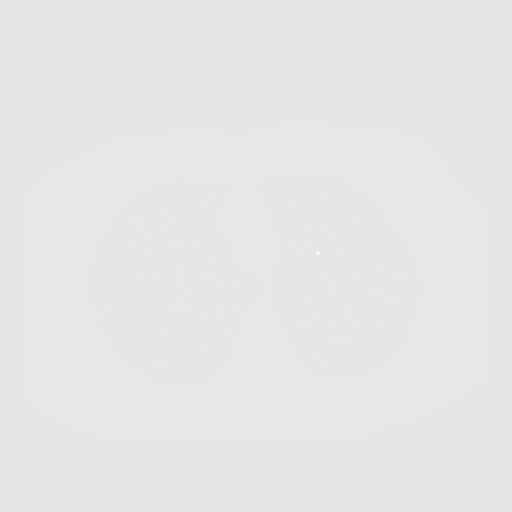
[frame 285/321  mediastinal]
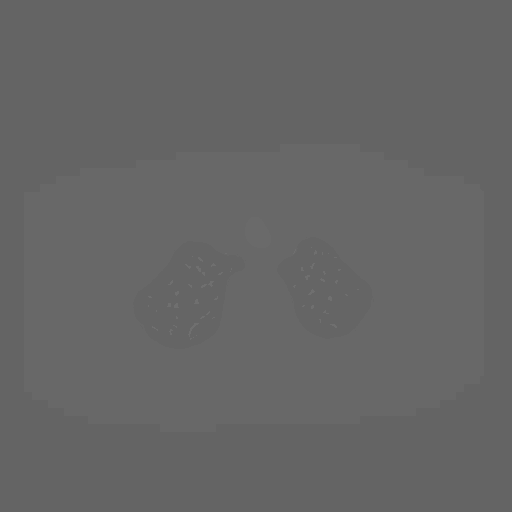
[frame 285/321  lung]
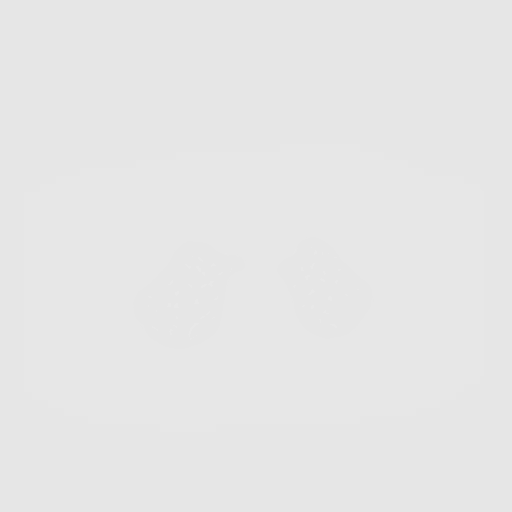
[frame 321/321  lung]
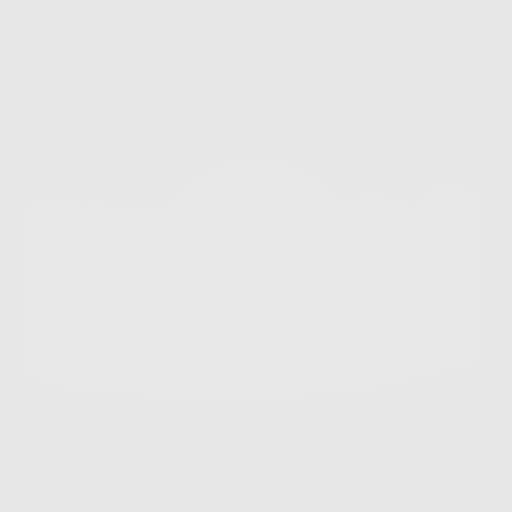

[10 of 10 positions shown; findings below may reference images not displayed]

FINDINGS: Cardiovascular: The heart size is normal. No substantial pericardial
effusion. Coronary artery calcification is evident. Mild
atherosclerotic calcification is noted in the wall of the thoracic
aorta.

Mediastinum/Nodes: No mediastinal lymphadenopathy. No evidence for
gross hilar lymphadenopathy although assessment is limited by the
lack of intravenous contrast on today's study. The esophagus has
normal imaging features. There is no axillary lymphadenopathy.

Lungs/Pleura: Centrilobular emphsyema noted. Previously identified
tiny right lung nodule no longer evident. No new suspicious
pulmonary nodule or mass. No focal airspace consolidation. No
pleural effusion.

Upper Abdomen: 16 mm calcified gallstone evident. Otherwise
unremarkable.

Musculoskeletal: No worrisome lytic or sclerotic osseous
abnormality.
IMPRESSION: 1. Lung-RADS 2, benign appearance or behavior. Continue annual
screening with low-dose chest CT without contrast in 12 months.
2. Cholelithiasis
3.  Emphysema ([id number].V) and Aortic Atherosclerosis (PVFLB-170.0)

## 2021-09-27 ENCOUNTER — Other Ambulatory Visit: Payer: Self-pay

## 2021-09-27 ENCOUNTER — Encounter: Payer: Self-pay | Admitting: Family Medicine

## 2021-09-27 ENCOUNTER — Ambulatory Visit (INDEPENDENT_AMBULATORY_CARE_PROVIDER_SITE_OTHER): Payer: Medicare Other | Admitting: Family Medicine

## 2021-09-27 VITALS — BP 130/70 | HR 80 | Ht 62.0 in | Wt 120.0 lb

## 2021-09-27 DIAGNOSIS — G2581 Restless legs syndrome: Secondary | ICD-10-CM

## 2021-09-27 DIAGNOSIS — Z23 Encounter for immunization: Secondary | ICD-10-CM | POA: Diagnosis not present

## 2021-09-27 DIAGNOSIS — I10 Essential (primary) hypertension: Secondary | ICD-10-CM | POA: Diagnosis not present

## 2021-09-27 DIAGNOSIS — K259 Gastric ulcer, unspecified as acute or chronic, without hemorrhage or perforation: Secondary | ICD-10-CM

## 2021-09-27 MED ORDER — GABAPENTIN 100 MG PO CAPS
ORAL_CAPSULE | ORAL | 1 refills | Status: DC
Start: 2021-09-27 — End: 2022-03-28

## 2021-09-27 MED ORDER — PANTOPRAZOLE SODIUM 40 MG PO TBEC: DELAYED_RELEASE_TABLET | ORAL | 1 refills | Status: DC

## 2021-09-27 MED ORDER — LISINOPRIL-HYDROCHLOROTHIAZIDE 20-12.5 MG PO TABS: ORAL_TABLET | ORAL | 1 refills | Status: DC

## 2021-09-27 NOTE — Progress Notes (Signed)
Date:  09/27/2021   Name:  Angelica Walker   DOB:  1941-06-25   MRN:  [id number]   Chief Complaint: Gastroesophageal Reflux, Hypertension, restless leg, and Flu Vaccine  Gastroesophageal Reflux She reports no abdominal pain, no belching, no chest pain, no choking, no coughing, no dysphagia, no heartburn, no hoarse voice, no nausea, no sore throat or no wheezing. This is a chronic problem. The current episode started more than 1 year ago. The problem occurs rarely. The problem has been resolved. Pertinent negatives include no anemia, fatigue, melena, muscle weakness, orthopnea or weight loss. She has tried a PPI (pantoprazole) for the symptoms. The treatment provided significant relief.  Hypertension This is a chronic problem. The current episode started more than 1 year ago. The problem has been resolved since onset. The problem is controlled. Pertinent negatives include no anxiety, blurred vision, chest pain, headaches, malaise/fatigue, neck pain, palpitations, peripheral edema or shortness of breath. Past treatments include ACE inhibitors and diuretics. The current treatment provides mild improvement. Compliance problems include psychosocial issues.  There is no history of angina, kidney disease, CAD/MI or CVA. There is no history of chronic renal disease, a hypertension causing med or renovascular disease.   Lab Results  Component Value Date   CREATININE 0.78 04/07/2021   BUN 12 04/07/2021   NA 140 04/07/2021   K 4.1 04/07/2021   CL 100 04/07/2021   CO2 24 04/07/2021   Lab Results  Component Value Date   CHOL 195 04/07/2021   HDL 47 04/07/2021   LDLCALC 121 (H) 04/07/2021   TRIG 150 (H) 04/07/2021   CHOLHDL 3.5 12/19/2017   Lab Results  Component Value Date   TSH 1.900 12/19/2017   Lab Results  Component Value Date   HGBA1C 5.6 04/07/2021   Lab Results  Component Value Date   WBC 6.3 04/11/2021   HGB 12.6 04/11/2021   HCT 37.6 04/11/2021   MCV 90.8 04/11/2021   PLT 301  04/11/2021   Lab Results  Component Value Date   ALT 8 12/19/2017   AST 18 12/19/2017   ALKPHOS 87 12/19/2017   BILITOT 0.4 12/19/2017     Review of Systems  Constitutional:  Negative for fatigue, malaise/fatigue and weight loss.  HENT:  Negative for hoarse voice and sore throat.   Eyes:  Negative for blurred vision.  Respiratory:  Negative for cough, choking, shortness of breath and wheezing.   Cardiovascular:  Negative for chest pain and palpitations.  Gastrointestinal:  Negative for abdominal pain, dysphagia, heartburn, melena and nausea.  Musculoskeletal:  Negative for muscle weakness and neck pain.  Neurological:  Negative for weakness, numbness and headaches.   Patient Active Problem List   Diagnosis Date Noted   Iron deficiency anemia due to chronic blood loss 07/02/2018   Smoker 03/18/2018   RLS (restless legs syndrome) 01/18/2018   Anemia due to blood loss 12/20/2017   Prediabetes 12/19/2017   Hyperlipidemia, unspecified 12/19/2017   Hypertension, essential, benign 12/19/2017   B12 deficiency 12/19/2017   DDD (degenerative disc disease), lumbar 12/19/2017   Multiple gastric ulcers 12/19/2017   COPD (chronic obstructive pulmonary disease) ([place]) 12/21/2014    Allergies  Allergen Reactions   Simvastatin Other (See Comments)    Past Surgical History:  Procedure Laterality Date   CATARACT EXTRACTION W/PHACO Left 06/29/2020   Procedure: CATARACT EXTRACTION PHACO AND INTRAOCULAR LENS PLACEMENT (IOC) LEFT 7.20 00:42.7;  Surgeon: Eulogio Bear, MD;  Location: [place];  Service: Ophthalmology;  Laterality: Left;   CATARACT EXTRACTION W/PHACO Right 07/26/2020   Procedure: CATARACT EXTRACTION PHACO AND INTRAOCULAR LENS PLACEMENT (IOC) RIGHT 7.97  00:53.0;  Surgeon: Eulogio Bear, MD;  Location: [place];  Service: Ophthalmology;  Laterality: Right;   COLONOSCOPY WITH PROPOFOL N/A 12/07/2017   Procedure: COLONOSCOPY WITH PROPOFOL;  Surgeon:  Lollie Sails, MD;  Location[facility] ENDOSCOPY;  Service: Endoscopy;  Laterality: N/A;   ESOPHAGOGASTRODUODENOSCOPY (EGD) WITH PROPOFOL N/A 12/07/2017   Procedure: ESOPHAGOGASTRODUODENOSCOPY (EGD) WITH PROPOFOL;  Surgeon: Lollie Sails, MD;  Location: [facility] ENDOSCOPY;  Service: Endoscopy;  Laterality: N/A;   ESOPHAGOGASTRODUODENOSCOPY (EGD) WITH PROPOFOL N/A 03/21/2018   Procedure: ESOPHAGOGASTRODUODENOSCOPY (EGD) WITH PROPOFOL;  Surgeon: Lollie Sails, MD;  Location: [facility] ENDOSCOPY;  Service: Endoscopy;  Laterality: N/A;   ESOPHAGOGASTRODUODENOSCOPY (EGD) WITH PROPOFOL N/A 07/22/2018   Procedure: ESOPHAGOGASTRODUODENOSCOPY (EGD) WITH PROPOFOL;  Surgeon: Lollie Sails, MD;  Location: [facility] ENDOSCOPY;  Service: Endoscopy;  Laterality: N/A;    Social History   Tobacco Use   Smoking status: Every Day    Packs/day: 1.00    Years: 54.00    Pack years: 54.00    Types: Cigarettes   Smokeless tobacco: Never   Tobacco comments:    patches and pills discussed   Vaping Use   Vaping Use: Never used  Substance Use Topics   Alcohol use: No   Drug use: No     Medication list has been reviewed and updated.  Current Meds  Medication Sig   acetaminophen (TYLENOL) 500 MG tablet Take 1,000 mg by mouth 2 (two) times daily as needed.   Ascorbic Acid (VITAMIN C) 100 MG tablet Take 100 mg by mouth daily.   Calcium Carbonate-Vitamin D 600-200 MG-UNIT TABS Take by mouth.   Cyanocobalamin (B-12) 1000 MCG TABS Take 1 tablet by mouth daily.   gabapentin (NEURONTIN) 100 MG capsule TAKE 1 CAPSULE BY MOUTH AT BEDTIME.   GLUCOSAMINE-CHONDROITIN PO Take 3 tablets by mouth.   lisinopril-hydrochlorothiazide (ZESTORETIC) 20-12.5 MG tablet TAKE ONE (1) TABLET BY MOUTH ONCE DAILY   Melatonin 10 MG TABS Take 1 tablet by mouth at bedtime.   MELATONIN PO Take by mouth at bedtime as needed.   pantoprazole (PROTONIX) 40 MG tablet TAKE (1) TABLET BY MOUTH EVERY DAY    PHQ 2/9 Scores 04/07/2021 10/07/2020  06/23/2020 04/07/2020  PHQ - 2 Score 0 0 0 0  PHQ- 9 Score 0 0 0 0    GAD 7 : Generalized Anxiety Score 04/07/2021 10/07/2020 06/23/2020 04/07/2020  Nervous, Anxious, on Edge 0 0 0 0  Control/stop worrying 0 0 0 0  Worry too much - different things 0 0 0 0  Trouble relaxing 0 0 0 0  Restless 0 0 0 0  Easily annoyed or irritable 0 0 0 0  Afraid - awful might happen 0 0 0 0  Total GAD 7 Score 0 0 0 0    BP Readings from Last 3 Encounters:  04/21/21 (!) 156/71  04/12/21 (!) 161/68  04/12/21 103/70    Physical Exam Vitals and nursing note reviewed.  Constitutional:      General: She is not in acute distress.    Appearance: She is not diaphoretic.  HENT:     Head: Normocephalic and atraumatic.     Right Ear: Tympanic membrane, ear canal and external ear normal.     Left Ear: Tympanic membrane and external ear normal.     Nose: Nose normal. No congestion or rhinorrhea.  Eyes:  General:        Right eye: No discharge.        Left eye: No discharge.     Conjunctiva/sclera: Conjunctivae normal.     Pupils: Pupils are equal, round, and reactive to light.  Neck:     Thyroid: No thyromegaly.     Vascular: No carotid bruit or JVD.  Cardiovascular:     Rate and Rhythm: Normal rate and regular rhythm.     Heart sounds: Normal heart sounds. No murmur heard.   No friction rub. No gallop.  Pulmonary:     Effort: Pulmonary effort is normal.     Breath sounds: Normal breath sounds. No wheezing or rhonchi.  Abdominal:     General: Bowel sounds are normal.     Palpations: Abdomen is soft. There is no mass.     Tenderness: There is no abdominal tenderness. There is no guarding.  Musculoskeletal:        General: Normal range of motion.     Cervical back: Normal range of motion and neck supple.  Lymphadenopathy:     Cervical: No cervical adenopathy.  Skin:    General: Skin is warm and dry.  Neurological:     Mental Status: She is alert.     Deep Tendon Reflexes: Reflexes are normal  and symmetric.    Wt Readings from Last 3 Encounters:  09/27/21 120 lb (54.4 kg)  04/12/21 118 lb 6.2 oz (53.7 kg)  04/07/21 125 lb (56.7 kg)    Ht 5\' 2"  (1.575 m)   Wt 120 lb (54.4 kg)   BMI 21.95 kg/m   Assessment and Plan:  1. Hypertension, essential, benign Chronic.  Controlled.  Stable.  Blood pressure 130/70.  Continue lisinopril hydrochlorothiazide 20-12.5 mg once a day.  Review of previous CMP is acceptable and will recheck in 6 months. - lisinopril-hydrochlorothiazide (ZESTORETIC) 20-12.5 MG tablet; TAKE ONE (1) TABLET BY MOUTH ONCE DAILY  Dispense: 90 tablet; Refill: 1  2. RLS (restless legs syndrome) Chronic.  Controlled.  Stable.  Patient has symptoms controlled with gabapentin 100 mg nightly. - gabapentin (NEURONTIN) 100 MG capsule; TAKE 1 CAPSULE BY MOUTH AT BEDTIME.  Dispense: 90 capsule; Refill: 1  3. Multiple gastric ulcers Patient is followed by gastroenterology and is currently on pantoprazole which we will continue 40 mg once a day. - pantoprazole (PROTONIX) 40 MG tablet; TAKE (1) TABLET BY MOUTH EVERY DAY  Dispense: 90 tablet; Refill: 1  4. Need for immunization against influenza Discussed and administered. - Flu Vaccine QUAD High Dose(Fluad)

## 2021-09-30 DIAGNOSIS — H53022 Refractive amblyopia, left eye: Secondary | ICD-10-CM | POA: Diagnosis not present

## 2021-10-05 ENCOUNTER — Telehealth: Payer: Self-pay

## 2021-10-05 NOTE — Telephone Encounter (Signed)
Copied from [place] [id number]. Topic: General - Other >> Oct 05, 2021  3:28 PM Valere Dross wrote: Reason for CRM: Pt called in stating she wanted to speak with Baxter Flattery, about her infusion shots that they have had prior conversation about, and wanted her to give her a call back, please advise.

## 2021-10-11 ENCOUNTER — Other Ambulatory Visit: Payer: Self-pay

## 2021-10-11 ENCOUNTER — Inpatient Hospital Stay: Payer: Medicare Other | Attending: Oncology

## 2021-10-11 DIAGNOSIS — D509 Iron deficiency anemia, unspecified: Secondary | ICD-10-CM | POA: Diagnosis not present

## 2021-10-11 DIAGNOSIS — F17210 Nicotine dependence, cigarettes, uncomplicated: Secondary | ICD-10-CM | POA: Diagnosis not present

## 2021-10-11 DIAGNOSIS — D500 Iron deficiency anemia secondary to blood loss (chronic): Secondary | ICD-10-CM

## 2021-10-11 LAB — CBC WITH DIFFERENTIAL/PLATELET
Abs Immature Granulocytes: 0.02 10*3/uL (ref 0.00–0.07)
Basophils Absolute: 0.1 10*3/uL (ref 0.0–0.1)
Basophils Relative: 1 %
Eosinophils Absolute: 0.2 10*3/uL (ref 0.0–0.5)
Eosinophils Relative: 2 %
HCT: 37.2 % (ref 36.0–46.0)
Hemoglobin: 12.4 g/dL (ref 12.0–15.0)
Immature Granulocytes: 0 %
Lymphocytes Relative: 28 %
Lymphs Abs: 2.6 10*3/uL (ref 0.7–4.0)
MCH: 30.6 pg (ref 26.0–34.0)
MCHC: 33.3 g/dL (ref 30.0–36.0)
MCV: 91.9 fL (ref 80.0–100.0)
Monocytes Absolute: 0.7 10*3/uL (ref 0.1–1.0)
Monocytes Relative: 8 %
Neutro Abs: 5.7 10*3/uL (ref 1.7–7.7)
Neutrophils Relative %: 61 %
Platelets: 316 10*3/uL (ref 150–400)
RBC: 4.05 MIL/uL (ref 3.87–5.11)
RDW: 13.2 % (ref 11.5–15.5)
WBC: 9.3 10*3/uL (ref 4.0–10.5)
nRBC: 0 % (ref 0.0–0.2)

## 2021-10-11 LAB — FERRITIN: Ferritin: 40 ng/mL (ref 11–307)

## 2021-10-12 ENCOUNTER — Ambulatory Visit: Payer: Medicare Other | Admitting: Oncology

## 2021-10-12 ENCOUNTER — Ambulatory Visit: Payer: Medicare Other

## 2021-10-12 ENCOUNTER — Encounter: Payer: Self-pay | Admitting: Oncology

## 2021-10-12 ENCOUNTER — Inpatient Hospital Stay: Payer: Medicare Other | Admitting: Oncology

## 2021-10-12 ENCOUNTER — Other Ambulatory Visit: Payer: Medicare Other

## 2021-10-12 ENCOUNTER — Inpatient Hospital Stay: Payer: Medicare Other

## 2021-10-12 VITALS — BP 118/79 | HR 86 | Temp 98.3°F | Resp 16 | Wt 123.8 lb

## 2021-10-12 DIAGNOSIS — Z122 Encounter for screening for malignant neoplasm of respiratory organs: Secondary | ICD-10-CM | POA: Diagnosis not present

## 2021-10-12 DIAGNOSIS — D509 Iron deficiency anemia, unspecified: Secondary | ICD-10-CM

## 2021-10-12 DIAGNOSIS — F17210 Nicotine dependence, cigarettes, uncomplicated: Secondary | ICD-10-CM | POA: Diagnosis not present

## 2021-10-12 DIAGNOSIS — Z87891 Personal history of nicotine dependence: Secondary | ICD-10-CM

## 2021-10-12 NOTE — Progress Notes (Signed)
Hematology/Oncology Consult note [facility]  [phone] [phone]  Patient Care Team: Juline Patch, MD as PCP - General (Family Medicine) Ok Edwards, NP as Nurse Practitioner (Gastroenterology) Efrain Sella, MD as Consulting Physician (Gastroenterology)   Name of the patient: Angelica Walker  [id number]  23-Jan-1941   Date of visit: 10/12/21  Diagnosis-history of iron deficiency anemia  Chief complaint/ Reason for visit-routine follow-up of iron deficiency anemia  Heme/Onc history: Patient is a 80 year old female with history of iron deficiency anemia secondary to gastric ulcer and GI bleeding back in April 2019.EGD on 12/07/2017 revealed  Z-line variable.  There was a non-obstructing non-bleeding gastric ulcer with no stigmata of bleeding. The duodenum was normal. Biopsies showed chronic nonspecific gastritis.  There was no H. pylori, dysplasia or malignancy. EGD on 03/21/2018 revealed Z-line variable.  There was erosive gastritis with healing ulceration in the stomach.  There was no H. pylori, dysplasia or malignancy.  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 EGD on 07/22/2018 revealed Z-line variable.  There was non-bleeding gastric ulcer with no stigmata of bleeding.  Acquired deformity in the gastric body (posterior wall).  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      Colonoscopy on 12/07/2017 revealed three 1 to 2 mm polyps in the rectum, removed. Diverticulosis in the sigmoid colon. Tortuous colon. 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  Pathology revealed 3 hyperplastic polyps.  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      She has required IV iron periodically.  He also gets low-dose screening CT chest on a yearly  basis given her history of smoking  Interval history-has baseline fatigue but denies any new complaints at this time.  Denies any bleeding or bruising.  Denies any dark melanotic stools for routine use of NSAIDs. ECOG PS- 1 Pain scale- 0  Review of systems- Review of Systems  Constitutional:  Positive for malaise/fatigue. Negative for chills, fever and weight loss.  HENT:  Negative for congestion, ear discharge and nosebleeds.   Eyes:  Negative for blurred vision.  Respiratory:  Negative for cough, hemoptysis, sputum production, shortness of breath and wheezing.   Cardiovascular:  Negative for chest pain, palpitations, orthopnea and claudication.  Gastrointestinal:  Negative for abdominal pain, blood in stool, constipation, diarrhea, heartburn, melena, nausea and vomiting.  Genitourinary:  Negative for dysuria, flank pain, frequency, hematuria and urgency.  Musculoskeletal:  Negative for back pain, joint pain and myalgias.  Skin:  Negative for rash.  Neurological:  Negative for dizziness, tingling, focal weakness, seizures, weakness and headaches.  Endo/Heme/Allergies:  Does not bruise/bleed easily.  Psychiatric/Behavioral:  Negative for depression and suicidal ideas. The patient does not have insomnia.      Allergies  Allergen Reactions   Simvastatin Other (See Comments)     Past Medical History:  Diagnosis Date   Anemia    COPD (chronic obstructive pulmonary disease) ([facility])    GERD (gastroesophageal reflux disease)    Hyperlipidemia    Hypertension    Multilevel degenerative disc disease    Serum lipids high      Past Surgical History:  Procedure Laterality Date   CATARACT EXTRACTION W/PHACO Left 06/29/2020   Procedure: CATARACT EXTRACTION PHACO AND INTRAOCULAR LENS PLACEMENT (IOC) LEFT 7.20 00:42.7;  Surgeon: Eulogio Bear, MD;  Location: [place];  Service: Ophthalmology;  Laterality: Left;   CATARACT EXTRACTION W/PHACO Right 07/26/2020   Procedure:  CATARACT EXTRACTION PHACO AND INTRAOCULAR LENS PLACEMENT (IOC) RIGHT 7.97  00:53.0;  Surgeon: Eulogio Bear, MD;  Location: [place];  Service: Ophthalmology;  Laterality: Right;   COLONOSCOPY WITH PROPOFOL N/A 12/07/2017   Procedure: COLONOSCOPY WITH PROPOFOL;  Surgeon: Lollie Sails, MD;  Location: [facility] ENDOSCOPY;  Service: Endoscopy;  Laterality: N/A;   ESOPHAGOGASTRODUODENOSCOPY (EGD) WITH PROPOFOL N/A 12/07/2017   Procedure: ESOPHAGOGASTRODUODENOSCOPY (EGD) WITH PROPOFOL;  Surgeon: Lollie Sails, MD;  Location: [facility] ENDOSCOPY;  Service: Endoscopy;  Laterality: N/A;   ESOPHAGOGASTRODUODENOSCOPY (EGD) WITH PROPOFOL N/A 03/21/2018   Procedure: ESOPHAGOGASTRODUODENOSCOPY (EGD) WITH PROPOFOL;  Surgeon: Lollie Sails, MD;  Location: [facility] ENDOSCOPY;  Service: Endoscopy;  Laterality: N/A;   ESOPHAGOGASTRODUODENOSCOPY (EGD) WITH PROPOFOL N/A 07/22/2018   Procedure: ESOPHAGOGASTRODUODENOSCOPY (EGD) WITH PROPOFOL;  Surgeon: Lollie Sails, MD;  Location: [facility] ENDOSCOPY;  Service: Endoscopy;  Laterality: N/A;    Social History   Socioeconomic History   Marital status: Divorced    Spouse name: Not on file   Number of children: 1   Years of education: some college   Highest education level: 12th grade  Occupational History   Occupation: Retired  Tobacco Use   Smoking status: Every Day    Packs/day: 1.00    Years: 54.00    Pack years: 54.00    Types: Cigarettes   Smokeless tobacco: Never   Tobacco comments:    patches and pills discussed   Vaping Use   Vaping Use: Never used  Substance and Sexual Activity   Alcohol use: No   Drug use: No   Sexual activity: Not Currently  Other Topics Concern   Not on file  Social History Narrative   Not on file   Social Determinants of Health   Financial Resource Strain: Not on file  Food Insecurity: Not on file  Transportation Needs: Not on file  Physical Activity: Not on file  Stress: Not on file  Social  Connections: Not on file  Intimate Partner Violence: Not on file    Family History  Problem Relation Age of Onset   Breast cancer Maternal Aunt      Current Outpatient Medications:    acetaminophen (TYLENOL) 500 MG tablet, Take 1,000 mg by mouth 2 (two) times daily as needed., Disp: , Rfl:    Ascorbic Acid (VITAMIN C) 100 MG tablet, Take 100 mg by mouth daily., Disp: , Rfl:    Calcium Carbonate-Vitamin D 600-200 MG-UNIT TABS, Take by mouth., Disp: , Rfl:    Cyanocobalamin (B-12) 1000 MCG TABS, Take 1 tablet by mouth daily., Disp: 30 tablet, Rfl:    gabapentin (NEURONTIN) 100 MG capsule, TAKE 1 CAPSULE BY MOUTH AT BEDTIME., Disp: 90 capsule, Rfl: 1   GLUCOSAMINE-CHONDROITIN PO, Take 3 tablets by mouth., Disp: , Rfl:    lisinopril-hydrochlorothiazide (ZESTORETIC) 20-12.5 MG tablet, TAKE ONE (1) TABLET BY MOUTH ONCE DAILY, Disp: 90 tablet, Rfl: 1   Melatonin 10 MG TABS, Take 1 tablet by mouth at bedtime., Disp: , Rfl:    pantoprazole (PROTONIX) 40 MG tablet, TAKE (1) TABLET BY MOUTH EVERY DAY, Disp: 90 tablet, Rfl: 1   ELDERBERRY PO, Take by mouth. (Patient not taking: Reported on 10/12/2021), Disp: , Rfl:    MELATONIN PO, Take by mouth at bedtime as needed. (Patient not taking: Reported on 10/12/2021), Disp: , Rfl:   Physical exam:  Vitals:   10/12/21 1249  BP: 118/79  Pulse: 86  Resp: 16  Temp: 98.3 F (36.8 C)  SpO2: 97%  Weight: 123 lb 12.8 oz (56.2 kg)   Physical Exam Constitutional:      General: She is not in acute distress. Cardiovascular:     Rate and Rhythm: Normal rate and regular rhythm.     Heart sounds: Normal heart sounds.  Pulmonary:     Effort: Pulmonary effort is normal.     Breath sounds: Normal breath sounds.  Abdominal:     General: Bowel sounds are normal.     Palpations: Abdomen is soft.  Skin:    General: Skin is warm and dry.  Neurological:     Mental Status: She is alert and oriented to person, place, and time.     CMP Latest Ref Rng & Units  04/07/2021  Glucose 65 - 99 mg/dL 99  BUN 8 - 27 mg/dL 12  Creatinine 0.57 - 1.00 mg/dL 0.78  Sodium 134 - 144 mmol/L 140  Potassium 3.5 - 5.2 mmol/L 4.1  Chloride 96 - 106 mmol/L 100  CO2 20 - 29 mmol/L 24  Calcium 8.7 - 10.3 mg/dL 9.8  Total Protein 6.0 - 8.5 g/dL -  Total Bilirubin 0.0 - 1.2 mg/dL -  Alkaline Phos 39 - 117 IU/L -  AST 0 - 40 IU/L -  ALT 0 - 32 IU/L -   CBC Latest Ref Rng & Units 10/11/2021  WBC 4.0 - 10.5 K/uL 9.3  Hemoglobin 12.0 - 15.0 g/dL 12.4  Hematocrit 36.0 - 46.0 % 37.2  Platelets 150 - 400 K/uL 316     Assessment and plan- Patient is a 80 y.o. female with history of iron deficiency anemia here for routine follow-up  Patient is not currently anemic with a hemoglobin of 12.4.  Her ferritin levels are normal at 40.  She does not require any IV iron at this time.Repeat CBC ferritin and iron studies in 6 months in 1 year and I will see her back in 1 year.  She would be due for low-dose lung cancer screening CT in July 2023 which I will order.  Recent July 2022 CT did not reveal any evidence of lung nodules or malignancy.   Visit Diagnosis 1. Personal history of tobacco use, presenting hazards to health   2. Screening for malignant neoplasm of respiratory organ   3. Iron deficiency anemia, unspecified iron deficiency anemia type      Dr. Randa Evens, MD, MPH [facility] [phone] 10/12/2021 2:09 PM

## 2021-10-13 ENCOUNTER — Other Ambulatory Visit: Payer: Self-pay | Admitting: *Deleted

## 2021-10-13 DIAGNOSIS — Z87891 Personal history of nicotine dependence: Secondary | ICD-10-CM

## 2021-10-13 DIAGNOSIS — F17200 Nicotine dependence, unspecified, uncomplicated: Secondary | ICD-10-CM

## 2022-02-23 ENCOUNTER — Other Ambulatory Visit: Payer: Self-pay | Admitting: Family Medicine

## 2022-02-23 DIAGNOSIS — Z1231 Encounter for screening mammogram for malignant neoplasm of breast: Secondary | ICD-10-CM

## 2022-03-13 ENCOUNTER — Ambulatory Visit: Payer: Medicare Other

## 2022-03-28 ENCOUNTER — Encounter: Payer: Self-pay | Admitting: Family Medicine

## 2022-03-28 ENCOUNTER — Ambulatory Visit (INDEPENDENT_AMBULATORY_CARE_PROVIDER_SITE_OTHER): Payer: Medicare Other | Admitting: Family Medicine

## 2022-03-28 VITALS — BP 112/64 | HR 80 | Ht 62.0 in | Wt 125.0 lb

## 2022-03-28 DIAGNOSIS — I10 Essential (primary) hypertension: Secondary | ICD-10-CM

## 2022-03-28 DIAGNOSIS — G2581 Restless legs syndrome: Secondary | ICD-10-CM | POA: Diagnosis not present

## 2022-03-28 DIAGNOSIS — R7303 Prediabetes: Secondary | ICD-10-CM | POA: Diagnosis not present

## 2022-03-28 DIAGNOSIS — K259 Gastric ulcer, unspecified as acute or chronic, without hemorrhage or perforation: Secondary | ICD-10-CM | POA: Diagnosis not present

## 2022-03-28 DIAGNOSIS — E785 Hyperlipidemia, unspecified: Secondary | ICD-10-CM | POA: Diagnosis not present

## 2022-03-28 MED ORDER — PANTOPRAZOLE SODIUM 40 MG PO TBEC: DELAYED_RELEASE_TABLET | ORAL | 1 refills | Status: DC

## 2022-03-28 MED ORDER — LISINOPRIL-HYDROCHLOROTHIAZIDE 20-12.5 MG PO TABS: ORAL_TABLET | ORAL | 1 refills | Status: DC

## 2022-03-28 MED ORDER — GABAPENTIN 100 MG PO CAPS: ORAL_CAPSULE | ORAL | 1 refills | Status: DC

## 2022-03-28 NOTE — Progress Notes (Signed)
? ? ?Date:  03/28/2022  ? ?Name:  Angelica Walker   DOB:  12-05-1941   MRN:  [id number] ? ? ?Chief Complaint: Hypertension, Gastroesophageal Reflux, and Prediabetes ? ?Hypertension ?This is a chronic problem. The current episode started more than 1 year ago. The problem is controlled. Pertinent negatives include no anxiety, blurred vision, chest pain, headaches, malaise/fatigue, neck pain, orthopnea, palpitations, peripheral edema, PND, shortness of breath or sweats. There are no associated agents to hypertension. Risk factors for coronary artery disease include dyslipidemia. Past treatments include ACE inhibitors and diuretics. The current treatment provides moderate improvement. There are no compliance problems.  There is no history of angina, kidney disease, CAD/MI, CVA, heart failure, left ventricular hypertrophy, PVD or retinopathy. There is no history of chronic renal disease, a hypertension causing med or renovascular disease.  ?Gastroesophageal Reflux ?She reports no abdominal pain, no chest pain, no dysphagia or no heartburn. for restless leg. This is a chronic problem. The current episode started more than 1 year ago. The problem has been waxing and waning. The symptoms are aggravated by certain foods. Pertinent negatives include no fatigue. She has tried a PPI for the symptoms.  ?Diabetes ?She has type 2 diabetes mellitus. Her disease course has been stable. There are no hypoglycemic associated symptoms. Pertinent negatives for hypoglycemia include no dizziness, headaches, nervousness/anxiousness or sweats. Pertinent negatives for diabetes include no blurred vision, no chest pain, no fatigue, no polydipsia, no polyphagia, no polyuria and no weakness. Pertinent negatives for diabetic complications include no CVA, PVD or retinopathy. Current diabetic treatment includes diet.  ?Neurologic Problem ?The patient's pertinent negatives include no focal sensory loss, focal weakness or weakness. Primary symptoms  comment: for restless leg. The current episode started more than 1 year ago. The problem has been gradually improving since onset. Pertinent negatives include no abdominal pain, back pain, chest pain, dizziness, fatigue, fever, headaches, neck pain, palpitations or shortness of breath. The treatment provided mild relief. There is no history of liver disease.  ?Hyperlipidemia ?This is a chronic problem. The current episode started more than 1 year ago. The problem is controlled. Recent lipid tests were reviewed and are normal. She has no history of chronic renal disease, diabetes, hypothyroidism, liver disease, obesity or nephrotic syndrome. Factors aggravating her hyperlipidemia include thiazides. Pertinent negatives include no chest pain, focal sensory loss, focal weakness, leg pain, myalgias or shortness of breath. Current antihyperlipidemic treatment includes diet change. The current treatment provides moderate improvement of lipids. There are no compliance problems.  Risk factors for coronary artery disease include diabetes mellitus.  ? ?Lab Results  ?Component Value Date  ? NA 140 04/07/2021  ? K 4.1 04/07/2021  ? CO2 24 04/07/2021  ? GLUCOSE 99 04/07/2021  ? BUN 12 04/07/2021  ? CREATININE 0.78 04/07/2021  ? CALCIUM 9.8 04/07/2021  ? EGFR 78 04/07/2021  ? GFRNONAA 85 10/07/2020  ? ?Lab Results  ?Component Value Date  ? CHOL 195 04/07/2021  ? HDL 47 04/07/2021  ? LDLCALC 121 (H) 04/07/2021  ? TRIG 150 (H) 04/07/2021  ? CHOLHDL 3.5 12/19/2017  ? ?Lab Results  ?Component Value Date  ? TSH 1.900 12/19/2017  ? ?Lab Results  ?Component Value Date  ? HGBA1C 5.6 04/07/2021  ? ?Lab Results  ?Component Value Date  ? WBC 9.3 10/11/2021  ? HGB 12.4 10/11/2021  ? HCT 37.2 10/11/2021  ? MCV 91.9 10/11/2021  ? PLT 316 10/11/2021  ? ?Lab Results  ?Component Value Date  ? ALT 8 12/19/2017  ?  AST 18 12/19/2017  ? ALKPHOS 87 12/19/2017  ? BILITOT 0.4 12/19/2017  ? ?No results found for: 25OHVITD2, [place], VD25OH  ? ?Review of  Systems  ?Constitutional:  Negative for chills, fatigue, fever and malaise/fatigue.  ?HENT:  Negative for drooling, ear discharge and ear pain.   ?Eyes:  Negative for blurred vision.  ?Respiratory:  Negative for shortness of breath.   ?Cardiovascular:  Negative for chest pain, palpitations, orthopnea, leg swelling and PND.  ?Gastrointestinal:  Negative for abdominal pain, blood in stool, constipation, diarrhea, dysphagia and heartburn.  ?Endocrine: Negative for polydipsia, polyphagia and polyuria.  ?Genitourinary:  Negative for dysuria, frequency, hematuria and urgency.  ?Musculoskeletal:  Negative for back pain, myalgias and neck pain.  ?Skin:  Negative for rash.  ?Allergic/Immunologic: Negative for environmental allergies.  ?Neurological:  Negative for dizziness, focal weakness, weakness and headaches.  ?Hematological:  Does not bruise/bleed easily.  ?Psychiatric/Behavioral:  Negative for suicidal ideas. The patient is not nervous/anxious.   ? ?Patient Active Problem List  ? Diagnosis Date Noted  ? Iron deficiency anemia due to chronic blood loss 07/02/2018  ? Smoker 03/18/2018  ? RLS (restless legs syndrome) 01/18/2018  ? Anemia due to blood loss 12/20/2017  ? Prediabetes 12/19/2017  ? Hyperlipidemia, unspecified 12/19/2017  ? Hypertension, essential, benign 12/19/2017  ? B12 deficiency 12/19/2017  ? DDD (degenerative disc disease), lumbar 12/19/2017  ? Multiple gastric ulcers 12/19/2017  ? COPD (chronic obstructive pulmonary disease) ([place]) 12/21/2014  ? ? ?Allergies  ?Allergen Reactions  ? Simvastatin Other (See Comments)  ? ? ?Past Surgical History:  ?Procedure Laterality Date  ? CATARACT EXTRACTION W/PHACO Left 06/29/2020  ? Procedure: CATARACT EXTRACTION PHACO AND INTRAOCULAR LENS PLACEMENT (IOC) LEFT 7.20 00:42.7;  Surgeon: Eulogio Bear, MD;  Location: [facility];  Service: Ophthalmology;  Laterality: Left;  ? CATARACT EXTRACTION W/PHACO Right 07/26/2020  ? Procedure: CATARACT EXTRACTION PHACO  AND INTRAOCULAR LENS PLACEMENT (IOC) RIGHT 7.97  00:53.0;  Surgeon: Eulogio Bear, MD;  Location: [facility];  Service: Ophthalmology;  Laterality: Right;  ? COLONOSCOPY WITH PROPOFOL N/A 12/07/2017  ? Procedure: COLONOSCOPY WITH PROPOFOL;  Surgeon: Lollie Sails, MD;  Location: [facility] ENDOSCOPY;  Service: Endoscopy;  Laterality: N/A;  ? ESOPHAGOGASTRODUODENOSCOPY (EGD) WITH PROPOFOL N/A 12/07/2017  ? Procedure: ESOPHAGOGASTRODUODENOSCOPY (EGD) WITH PROPOFOL;  Surgeon: Lollie Sails, MD;  Location: [facility] ENDOSCOPY;  Service: Endoscopy;  Laterality: N/A;  ? ESOPHAGOGASTRODUODENOSCOPY (EGD) WITH PROPOFOL N/A 03/21/2018  ? Procedure: ESOPHAGOGASTRODUODENOSCOPY (EGD) WITH PROPOFOL;  Surgeon: Lollie Sails, MD;  Location: [facility] ENDOSCOPY;  Service: Endoscopy;  Laterality: N/A;  ? ESOPHAGOGASTRODUODENOSCOPY (EGD) WITH PROPOFOL N/A 07/22/2018  ? Procedure: ESOPHAGOGASTRODUODENOSCOPY (EGD) WITH PROPOFOL;  Surgeon: Lollie Sails, MD;  Location: [facility] ENDOSCOPY;  Service: Endoscopy;  Laterality: N/A;  ? ? ?Social History  ? ?Tobacco Use  ? Smoking status: Every Day  ?  Packs/day: 1.00  ?  Years: 54.00  ?  Pack years: 54.00  ?  Types: Cigarettes  ? Smokeless tobacco: Never  ? Tobacco comments:  ?  patches and pills discussed   ?Vaping Use  ? Vaping Use: Never used  ?Substance Use Topics  ? Alcohol use: No  ? Drug use: No  ? ? ? ?Medication list has been reviewed and updated. ? ?Current Meds  ?Medication Sig  ? acetaminophen (TYLENOL) 500 MG tablet Take 1,000 mg by mouth 2 (two) times daily as needed.  ? Ascorbic Acid (VITAMIN C) 100 MG tablet Take 100 mg by mouth daily.  ?  Calcium Carbonate-Vitamin D 600-200 MG-UNIT TABS Take by mouth.  ? Cyanocobalamin (B-12) 1000 MCG TABS Take 1 tablet by mouth daily.  ? gabapentin (NEURONTIN) 100 MG capsule TAKE 1 CAPSULE BY MOUTH AT BEDTIME.  ? GLUCOSAMINE-CHONDROITIN PO Take 3 tablets by mouth.  ? lisinopril-hydrochlorothiazide (ZESTORETIC) 20-12.5 MG tablet TAKE  ONE (1) TABLET BY MOUTH ONCE DAILY  ? Melatonin 10 MG TABS Take 1 tablet by mouth at bedtime.  ? MELATONIN PO Take by mouth at bedtime as needed.  ? pantoprazole (PROTONIX) 40 MG tablet TAKE (1) TABLET BY MO

## 2022-03-29 LAB — LIPID PANEL WITH LDL/HDL RATIO
Cholesterol, Total: 212 mg/dL — ABNORMAL HIGH (ref 100–199)
HDL: 49 mg/dL (ref >39)
LDL Chol Calc (NIH): 136 mg/dL — ABNORMAL HIGH (ref 0–99)
LDL/HDL Ratio: 2.8 ratio (ref 0.0–3.2)
Triglycerides: 152 mg/dL — ABNORMAL HIGH (ref 0–149)
VLDL Cholesterol Cal: 27 mg/dL (ref 5–40)

## 2022-03-29 LAB — RENAL FUNCTION PANEL
Albumin: 4.7 g/dL (ref 3.7–4.7)
BUN/Creatinine Ratio: 22 (ref 12–28)
BUN: 18 mg/dL (ref 8–27)
CO2: 24 mmol/L (ref 20–29)
Calcium: 9.8 mg/dL (ref 8.7–10.3)
Chloride: 100 mmol/L (ref 96–106)
Creatinine, Ser: 0.81 mg/dL (ref 0.57–1.00)
Glucose: 93 mg/dL (ref 70–99)
Phosphorus: 4 mg/dL (ref 3.0–4.3)
Potassium: 4 mmol/L (ref 3.5–5.2)
Sodium: 141 mmol/L (ref 134–144)
eGFR: 74 mL/min/{1.73_m2} (ref >59)

## 2022-03-29 LAB — HEMOGLOBIN A1C
Est. average glucose Bld gHb Est-mCnc: 120 mg/dL
Hgb A1c MFr Bld: 5.8 % — ABNORMAL HIGH (ref 4.8–5.6)

## 2022-03-31 ENCOUNTER — Telehealth: Payer: Self-pay | Admitting: Oncology

## 2022-03-31 NOTE — Telephone Encounter (Signed)
pt called in  to cancel  LAB appt, will call in to [place]  ?

## 2022-04-12 ENCOUNTER — Other Ambulatory Visit: Payer: Medicare Other

## 2022-04-26 ENCOUNTER — Ambulatory Visit
Admission: RE | Admit: 2022-04-26 | Discharge: 2022-04-26 | Disposition: A | Discharge status: Home / Self Care | Payer: Medicare Other | Source: Ambulatory Visit | Attending: Family Medicine | Admitting: Family Medicine

## 2022-04-26 DIAGNOSIS — Z1231 Encounter for screening mammogram for malignant neoplasm of breast: Secondary | ICD-10-CM | POA: Diagnosis not present

## 2022-05-25 DIAGNOSIS — L821 Other seborrheic keratosis: Secondary | ICD-10-CM | POA: Diagnosis not present

## 2022-05-25 DIAGNOSIS — L570 Actinic keratosis: Secondary | ICD-10-CM | POA: Diagnosis not present

## 2022-05-25 DIAGNOSIS — Z859 Personal history of malignant neoplasm, unspecified: Secondary | ICD-10-CM | POA: Diagnosis not present

## 2022-05-25 DIAGNOSIS — L578 Other skin changes due to chronic exposure to nonionizing radiation: Secondary | ICD-10-CM | POA: Diagnosis not present

## 2022-05-25 DIAGNOSIS — Z872 Personal history of diseases of the skin and subcutaneous tissue: Secondary | ICD-10-CM | POA: Diagnosis not present

## 2022-06-05 ENCOUNTER — Telehealth: Payer: Self-pay | Admitting: Family Medicine

## 2022-06-05 ENCOUNTER — Telehealth: Payer: Self-pay | Admitting: Oncology

## 2022-06-05 NOTE — Telephone Encounter (Signed)
Copied from [place] [id number]. Topic: General - Call Back - No Documentation >> Jun 05, 2022  2:27 PM Sabas Sous wrote: Reason for CRM: Pt called requesting to speak to Tillie Rung, she has a question about her last visit (lab)  Best contact: [phone]

## 2022-06-05 NOTE — Telephone Encounter (Signed)
Pt called and stated she thinks she needs an iron fusion, she feels the same was she did the last time she needed them. How would you like to proceed?

## 2022-06-05 NOTE — Telephone Encounter (Signed)
She can come for cbc ferritin and iron studies this week or next week and we can decide based on labs

## 2022-06-05 NOTE — Telephone Encounter (Signed)
Called pt let her know that we did not check her iron on her last visit. Pt wanted to know if she could get an iron infusion. Told pt to call the office where she got the infusions done. Pt verbalized understanding.  KP

## 2022-06-06 ENCOUNTER — Other Ambulatory Visit: Payer: Self-pay

## 2022-06-06 ENCOUNTER — Inpatient Hospital Stay: Payer: Medicare Other | Attending: Oncology

## 2022-06-06 ENCOUNTER — Encounter: Payer: Self-pay | Admitting: Internal Medicine

## 2022-06-06 DIAGNOSIS — F17210 Nicotine dependence, cigarettes, uncomplicated: Secondary | ICD-10-CM | POA: Insufficient documentation

## 2022-06-06 DIAGNOSIS — D509 Iron deficiency anemia, unspecified: Secondary | ICD-10-CM | POA: Diagnosis not present

## 2022-06-06 DIAGNOSIS — Z122 Encounter for screening for malignant neoplasm of respiratory organs: Secondary | ICD-10-CM

## 2022-06-06 LAB — CBC WITH DIFFERENTIAL/PLATELET
Abs Immature Granulocytes: 0.03 10*3/uL (ref 0.00–0.07)
Basophils Absolute: 0.1 10*3/uL (ref 0.0–0.1)
Basophils Relative: 1 %
Eosinophils Absolute: 0.1 10*3/uL (ref 0.0–0.5)
Eosinophils Relative: 1 %
HCT: 36.3 % (ref 36.0–46.0)
Hemoglobin: 11.7 g/dL — ABNORMAL LOW (ref 12.0–15.0)
Immature Granulocytes: 0 %
Lymphocytes Relative: 24 %
Lymphs Abs: 2.4 10*3/uL (ref 0.7–4.0)
MCH: 27.9 pg (ref 26.0–34.0)
MCHC: 32.2 g/dL (ref 30.0–36.0)
MCV: 86.6 fL (ref 80.0–100.0)
Monocytes Absolute: 0.8 10*3/uL (ref 0.1–1.0)
Monocytes Relative: 8 %
Neutro Abs: 6.8 10*3/uL (ref 1.7–7.7)
Neutrophils Relative %: 66 %
Platelets: 321 10*3/uL (ref 150–400)
RBC: 4.19 MIL/uL (ref 3.87–5.11)
RDW: 14.5 % (ref 11.5–15.5)
WBC: 10.1 10*3/uL (ref 4.0–10.5)
nRBC: 0 % (ref 0.0–0.2)

## 2022-06-06 LAB — IRON AND TIBC
Iron: 52 ug/dL (ref 28–170)
Saturation Ratios: 12 % (ref 10.4–31.8)
TIBC: 428 ug/dL (ref 250–450)
UIBC: 376 ug/dL

## 2022-06-06 LAB — FERRITIN: Ferritin: 16 ng/mL (ref 11–307)

## 2022-06-07 ENCOUNTER — Telehealth: Payer: Self-pay | Admitting: *Deleted

## 2022-06-07 NOTE — Telephone Encounter (Signed)
Called the patient and told her that the labs were low and dr Janese Banks suggested that you get IV iron. Patient is great with that . I told her that we need to check to see what kind of iron they approve and then [facility] the scheduler would call with the appts. Pt is ok with that and if pt not at home to please leave a message and she can call back if needed

## 2022-06-07 NOTE — Telephone Encounter (Signed)
-----   Message from Sindy Guadeloupe, MD sent at 06/06/2022 12:34 PM EDT ----- She has low ferritin and iron studies. Mild anemia. Ok to do IV iron. Sherry please call her. She will keep next appt in oct as planned. After that we will check labs q3 months as per her request

## 2022-06-08 ENCOUNTER — Encounter: Payer: Self-pay | Admitting: Internal Medicine

## 2022-06-08 NOTE — Telephone Encounter (Signed)
Anderson Malta the scheduler called pt and got her set up with 2 appts taking feraheme and pt aware

## 2022-06-09 ENCOUNTER — Other Ambulatory Visit: Payer: Self-pay | Admitting: Oncology

## 2022-06-14 ENCOUNTER — Inpatient Hospital Stay: Payer: Medicare Other

## 2022-06-14 VITALS — BP 107/91 | HR 74 | Temp 98.3°F | Resp 18

## 2022-06-14 DIAGNOSIS — F17210 Nicotine dependence, cigarettes, uncomplicated: Secondary | ICD-10-CM | POA: Diagnosis not present

## 2022-06-14 DIAGNOSIS — D509 Iron deficiency anemia, unspecified: Secondary | ICD-10-CM | POA: Diagnosis not present

## 2022-06-14 DIAGNOSIS — D500 Iron deficiency anemia secondary to blood loss (chronic): Secondary | ICD-10-CM

## 2022-06-14 MED ORDER — SODIUM CHLORIDE 0.9 % IV SOLN
Freq: Once | INTRAVENOUS | Status: AC
  Filled 2022-06-14: qty 250

## 2022-06-14 MED ORDER — SODIUM CHLORIDE 0.9 % IV SOLN
510.0000 mg | INTRAVENOUS | Status: DC
  Administered 2022-06-14: 510 mg via INTRAVENOUS
  Filled 2022-06-14: qty 17

## 2022-06-14 NOTE — Patient Instructions (Signed)

## 2022-06-19 ENCOUNTER — Ambulatory Visit: Payer: Medicare Other

## 2022-06-22 ENCOUNTER — Inpatient Hospital Stay: Payer: Medicare Other | Attending: Oncology

## 2022-06-22 VITALS — BP 111/71 | HR 91 | Temp 98.6°F

## 2022-06-22 DIAGNOSIS — F17210 Nicotine dependence, cigarettes, uncomplicated: Secondary | ICD-10-CM | POA: Diagnosis not present

## 2022-06-22 DIAGNOSIS — D500 Iron deficiency anemia secondary to blood loss (chronic): Secondary | ICD-10-CM

## 2022-06-22 DIAGNOSIS — D509 Iron deficiency anemia, unspecified: Secondary | ICD-10-CM | POA: Diagnosis not present

## 2022-06-22 MED ORDER — SODIUM CHLORIDE 0.9 % IV SOLN
Freq: Once | INTRAVENOUS | Status: AC
  Filled 2022-06-22: qty 250

## 2022-06-22 MED ORDER — SODIUM CHLORIDE 0.9 % IV SOLN
510.0000 mg | INTRAVENOUS | Status: DC
  Administered 2022-06-22: 510 mg via INTRAVENOUS
  Filled 2022-06-22: qty 17

## 2022-06-22 NOTE — Patient Instructions (Signed)
[facility]  Discharge Instructions: Thank you for choosing  Cancer Center to provide your oncology and hematology care.  If you have a lab appointment with the Cancer Center, please go directly to the Cancer Center and check in at the registration area.  Wear comfortable clothing and clothing appropriate for easy access to any Portacath or PICC line.   We strive to give you quality time with your provider. You may need to reschedule your appointment if you arrive late (15 or more minutes).  Arriving late affects you and other patients whose appointments are after yours.  Also, if you miss three or more appointments without notifying the office, you may be dismissed from the clinic at the provider's discretion.      For prescription refill requests, have your pharmacy contact our office and allow 72 hours for refills to be completed.    Today you received the following chemotherapy and/or immunotherapy agents Feraheme.      To help prevent nausea and vomiting after your treatment, we encourage you to take your nausea medication as directed.  BELOW ARE SYMPTOMS THAT SHOULD BE REPORTED IMMEDIATELY: *FEVER GREATER THAN 100.4 F (38 C) OR HIGHER *CHILLS OR SWEATING *NAUSEA AND VOMITING THAT IS NOT CONTROLLED WITH YOUR NAUSEA MEDICATION *UNUSUAL SHORTNESS OF BREATH *UNUSUAL BRUISING OR BLEEDING *URINARY PROBLEMS (pain or burning when urinating, or frequent urination) *BOWEL PROBLEMS (unusual diarrhea, constipation, pain near the anus) TENDERNESS IN MOUTH AND THROAT WITH OR WITHOUT PRESENCE OF ULCERS (sore throat, sores in mouth, or a toothache) UNUSUAL RASH, SWELLING OR PAIN  UNUSUAL VAGINAL DISCHARGE OR ITCHING   Items with * indicate a potential emergency and should be followed up as soon as possible or go to the Emergency Department if any problems should occur.  Please show the CHEMOTHERAPY ALERT CARD or IMMUNOTHERAPY ALERT CARD at check-in to  the Emergency Department and triage nurse.  Should you have questions after your visit or need to cancel or reschedule your appointment, please contact [facility]  [phone] and follow the prompts.  Office hours are 8:00 a.m. to 4:30 p.m. Monday - Friday. Please note that voicemails left after 4:00 p.m. may not be returned until the following business day.  We are closed weekends and major holidays. You have access to a nurse at all times for urgent questions. Please call the main number to the clinic [phone] and follow the prompts.  For any non-urgent questions, you may also contact your provider using MyChart. We now offer e-Visits for anyone 18 and older to request care online for non-urgent symptoms. For details visit [email or web address].   Also download the MyChart app! Go to the app store, search "MyChart", open the app, select , and log in with your MyChart username and password.  Masks are optional in the cancer centers. If you would like for your care team to wear a mask while they are taking care of you, please let them know. For doctor visits, patients may have with them one support person who is at least 79 years old. At this time, visitors are not allowed in the infusion area.   

## 2022-06-29 ENCOUNTER — Ambulatory Visit: Payer: Medicare Other

## 2022-08-17 ENCOUNTER — Telehealth: Payer: Self-pay | Admitting: Family Medicine

## 2022-08-17 NOTE — Telephone Encounter (Signed)
Copied from [place] [phone]. Topic: Medicare AWV >> Aug 17, 2022 10:02 AM Jae Dire wrote: Reason for CRM:  Left message for patient to call back and schedule Medicare Annual Wellness Visit (AWV) in office.   If unable to come into the office for AWV,  please offer to do virtually or by telephone.  Last AWV: 03/13/2018  Please schedule at anytime with [facility] Health Advisor.      30 minute appointment for Virtual or phone 45 minute appointment for in office or Initial virtual/phone  Any questions, please call me at [phone]

## 2022-08-30 ENCOUNTER — Ambulatory Visit (INDEPENDENT_AMBULATORY_CARE_PROVIDER_SITE_OTHER): Payer: Medicare Other

## 2022-08-30 DIAGNOSIS — Z0000 Encounter for general adult medical examination without abnormal findings: Secondary | ICD-10-CM

## 2022-08-30 NOTE — Progress Notes (Signed)
Subjective:   MARYJANE BENEDICT is a 81 y.o. female who presents for Medicare Annual (Subsequent) preventive examination.  I connected with  JAEDEN WESTBAY on 08/30/22 by a audio enabled telemedicine application and verified that I am speaking with the correct person using two identifiers.  Patient Location: Home  Provider Location: Office/Clinic  I discussed the limitations of evaluation and management by telemedicine. The patient expressed understanding and agreed to proceed.   Review of Systems    Defer to PCP Cardiac Risk Factors include: advanced age (>61mn, >>59women);hypertension     Objective:    Today's Vitals   08/30/22 1536  PainSc: 7    There is no height or weight on file to calculate BMI.     08/30/2022    3:40 PM 04/12/2021    1:00 PM 10/12/2020   10:32 AM 07/26/2020    8:27 AM 06/29/2020    8:45 AM 04/11/2020    1:29 PM 01/16/2020    1:36 PM  Advanced Directives  Does Patient Have a Medical Advance Directive? No No No No No No No  Would patient like information on creating a medical advance directive? Yes (Inpatient - patient defers creating a medical advance directive at this time - Information given) No - Patient declined No - Patient declined No - Patient declined No - Patient declined No - Patient declined No - Patient declined    Current Medications (verified) Outpatient Encounter Medications as of 08/30/2022  Medication Sig   acetaminophen (TYLENOL) 500 MG tablet Take 1,000 mg by mouth 2 (two) times daily as needed.   Ascorbic Acid (VITAMIN C) 100 MG tablet Take 100 mg by mouth daily.   Calcium Carbonate-Vitamin D 600-200 MG-UNIT TABS Take by mouth.   Cyanocobalamin (B-12) 1000 MCG TABS Take 1 tablet by mouth daily.   gabapentin (NEURONTIN) 100 MG capsule TAKE 1 CAPSULE BY MOUTH AT BEDTIME.   GLUCOSAMINE-CHONDROITIN PO Take 3 tablets by mouth.   lisinopril-hydrochlorothiazide (ZESTORETIC) 20-12.5 MG tablet TAKE ONE (1) TABLET BY MOUTH ONCE DAILY    Melatonin 10 MG TABS Take 1 tablet by mouth at bedtime.   MELATONIN PO Take by mouth at bedtime as needed.   pantoprazole (PROTONIX) 40 MG tablet TAKE (1) TABLET BY MOUTH EVERY DAY   No facility-administered encounter medications on file as of 08/30/2022.    Allergies (verified) Simvastatin   History: Past Medical History:  Diagnosis Date   Anemia    COPD (chronic obstructive pulmonary disease) (HCC)    GERD (gastroesophageal reflux disease)    Hyperlipidemia    Hypertension    Multilevel degenerative disc disease    Serum lipids high    Past Surgical History:  Procedure Laterality Date   CATARACT EXTRACTION W/PHACO Left 06/29/2020   Procedure: CATARACT EXTRACTION PHACO AND INTRAOCULAR LENS PLACEMENT (IOC) LEFT 7.20 00:42.7;  Surgeon: KEulogio Bear MD;  [facility]  Service: Ophthalmology;  Laterality: Left;   CATARACT EXTRACTION W/PHACO Right 07/26/2020   Procedure: CATARACT EXTRACTION PHACO AND INTRAOCULAR LENS PLACEMENT (IOC) RIGHT 7.97  00:53.0;  Surgeon: KEulogio Bear MD;  [facility] [facility]  Service: Ophthalmology;  Laterality: Right;   COLONOSCOPY WITH PROPOFOL N/A 12/07/2017   Procedure: COLONOSCOPY WITH PROPOFOL;  Surgeon: SLollie Sails MD;  [facility]  Service: Endoscopy;  Laterality: N/A;   ESOPHAGOGASTRODUODENOSCOPY (EGD) WITH PROPOFOL N/A 12/07/2017   Procedure: ESOPHAGOGASTRODUODENOSCOPY (EGD) WITH PROPOFOL;  Surgeon: SLollie Sails MD;  [facility]  Service: Endoscopy;  Laterality: N/A;   ESOPHAGOGASTRODUODENOSCOPY (EGD) WITH PROPOFOL N/A 03/21/2018   Procedure: ESOPHAGOGASTRODUODENOSCOPY (EGD) WITH PROPOFOL;  Surgeon: Lollie Sails, MD;  [facility];  Service: Endoscopy;  Laterality: N/A;   ESOPHAGOGASTRODUODENOSCOPY (EGD) WITH PROPOFOL N/A 07/22/2018   Procedure: ESOPHAGOGASTRODUODENOSCOPY (EGD) WITH PROPOFOL;  Surgeon: Lollie Sails, MD;  Location: [facility] ENDOSCOPY;   Service: Endoscopy;  Laterality: N/A;   Family History  Problem Relation Age of Onset   Breast cancer Maternal Aunt    Social History   Socioeconomic History   Marital status: Divorced    Spouse name: Not on file   Number of children: 1   Years of education: some college   Highest education level: 12th grade  Occupational History   Occupation: Retired  Tobacco Use   Smoking status: Every Day    Packs/day: 1.00    Years: 54.00    Total pack years: 54.00    Types: Cigarettes   Smokeless tobacco: Never   Tobacco comments:    patches and pills discussed   Vaping Use   Vaping Use: Never used  Substance and Sexual Activity   Alcohol use: No   Drug use: No   Sexual activity: Not Currently  Other Topics Concern   Not on file  Social History Narrative   Not on file   Social Determinants of Health   Financial Resource Strain: Low Risk  (08/30/2022)   Overall Financial Resource Strain (CARDIA)    Difficulty of Paying Living Expenses: Not hard at all  Food Insecurity: No Food Insecurity (08/30/2022)   Hunger Vital Sign    Worried About Running Out of Food in the Last Year: Never true    Ran Out of Food in the Last Year: Never true  Transportation Needs: No Transportation Needs (08/30/2022)   PRAPARE - Hydrologist (Medical): No    Lack of Transportation (Non-Medical): No  Physical Activity: Sufficiently Active (08/30/2022)   Exercise Vital Sign    Days of Exercise per Week: 7 days    Minutes of Exercise per Session: 60 min  Stress: No Stress Concern Present (08/30/2022)   [place]    Feeling of Stress : Not at all  Social Connections: Socially Isolated (08/30/2022)   Social Connection and Isolation Panel [NHANES]    Frequency of Communication with Friends and Family: More than three times a week    Frequency of Social Gatherings with Friends and Family: More than three times a week     Attends Religious Services: Never    Marine scientist or Organizations: No    Attends Music therapist: Never    Marital Status: Never married    Tobacco Counseling Ready to quit: Not Answered Counseling given: Not Answered Tobacco comments: patches and pills discussed    Clinical Intake:  Pre-visit preparation completed: Yes  Pain : 0-10 Pain Score: 7  Pain Type: Chronic pain Pain Location: Back     Nutritional Risks: None Diabetes: No     Diabetic? No.     Information entered by :: Wyatt Haste, [place] of Daily Living    08/30/2022    3:40 PM  In your present state of health, do you have any difficulty performing the following activities:  Hearing? 0  Vision? 0  Difficulty concentrating or making decisions? 0  Walking or climbing stairs? 0  Dressing or bathing? 0  Doing errands, shopping? 0  Preparing Food and eating ? N  Using the Toilet? N  In the past six months, have you accidently leaked urine? Y  Do you have problems with loss of bowel control? N  Managing your Medications? N  Managing your Finances? N  Housekeeping or managing your Housekeeping? N    Patient Care Team: Juline Patch, MD as PCP - General (Family Medicine) Ok Edwards, NP as Nurse Practitioner (Gastroenterology) Efrain Sella, MD as Consulting Physician (Gastroenterology) Sindy Guadeloupe, MD as Consulting Physician (Oncology)  Indicate any recent Medical Services you may have received from other than [facility] providers in the past year (date may be approximate).     Assessment:   This is a routine wellness examination for Annalise.  Hearing/Vision screen No concerns for hearing. Patient wears prescription eye glasses.  Dietary issues and exercise activities discussed:     Goals Addressed   None   Depression Screen    08/30/2022    3:39 PM 03/28/2022    1:20 PM 04/07/2021   11:32 AM 10/07/2020   10:43 AM 06/23/2020     8:37 AM 04/07/2020   10:41 AM 03/19/2019   10:55 AM  PHQ 2/9 Scores  PHQ - 2 Score 0 0 0 0 0 0 0  PHQ- 9 Score 3 0 0 0 0 0 1    Fall Risk    08/30/2022    3:40 PM 03/28/2022    1:19 PM 10/07/2020   10:43 AM 06/23/2020    8:37 AM 03/19/2019   10:55 AM  Fall Risk   Falls in the past year? 0 0 0 0 0  Number falls in past yr: 0 0     Injury with Fall? 0 0     Risk for fall due to : No Fall Risks No Fall Risks     Follow up Falls evaluation completed Falls evaluation completed Falls evaluation completed Falls evaluation completed Falls evaluation completed    [place]:  Any stairs in or around the home? Yes  If so, are there any without handrails? Yes  Home free of loose throw rugs in walkways, pet beds, electrical cords, etc? Yes  Adequate lighting in your home to reduce risk of falls? Yes   ASSISTIVE DEVICES UTILIZED TO PREVENT FALLS:  Life alert? No  Use of a cane, walker or w/c? No  Grab bars in the bathroom? No  Shower chair or bench in shower? No  Elevated toilet seat or a handicapped toilet? No    Cognitive Function:        08/30/2022    3:40 PM 03/13/2018   11:53 AM  6CIT Screen  What Year? 4 points 0 points  What month? 0 points 0 points  What time? 0 points 3 points  Count back from 20 4 points 0 points  Months in reverse 0 points 0 points  Repeat phrase 0 points 4 points  Total Score 8 points 7 points    Immunizations Immunization History  Administered Date(s) Administered   Fluad Quad(high Dose 65+) 10/07/2020, 09/27/2021   PFIZER(Purple Top)SARS-COV-2 Vaccination 02/20/2020, 03/12/2020, 10/04/2020, 04/07/2021   Pneumococcal Conjugate-13 02/07/2017   Pneumococcal Polysaccharide-23 12/21/2014   Tdap 12/21/2014    TDAP status: Up to date  Flu Vaccine status: Due, Education has been provided regarding the importance of this vaccine. Advised may receive this vaccine at local pharmacy or Health Dept. Aware to provide a copy  of the vaccination record if obtained  from local pharmacy or Health Dept. Verbalized acceptance and understanding.  Pneumococcal vaccine status: Up to date  Covid-19 vaccine status: Completed vaccines  Qualifies for Shingles Vaccine? Yes   Zostavax completed No   Shingrix Completed?: No.    Education has been provided regarding the importance of this vaccine. Patient has been advised to call insurance company to determine out of pocket expense if they have not yet received this vaccine. Advised may also receive vaccine at local pharmacy or Health Dept. Verbalized acceptance and understanding.  Screening Tests Health Maintenance  Topic Date Due   Diabetic kidney evaluation - Urine ACR  Never done   Zoster Vaccines- Shingrix (1 of 2) Never done   COVID-19 Vaccine (5 - Pfizer series) 06/02/2021   OPHTHALMOLOGY EXAM  06/14/2021   Hepatitis C Screening  09/27/2022 (Originally 04/19/1959)   INFLUENZA VACCINE  03/18/2023 (Originally 07/18/2022)   HEMOGLOBIN A1C  09/27/2022   Diabetic kidney evaluation - GFR measurement  03/29/2023   MAMMOGRAM  04/27/2023   TETANUS/TDAP  12/21/2024   Pneumonia Vaccine 67+ Years old  Completed   DEXA SCAN  Completed   HPV VACCINES  Aged Out   FOOT EXAM  Discontinued   COLONOSCOPY (Pts 45-63yr Insurance coverage will need to be confirmed)  Discontinued    Health Maintenance  Health Maintenance Due  Topic Date Due   Diabetic kidney evaluation - Urine ACR  Never done   Zoster Vaccines- Shingrix (1 of 2) Never done   COVID-19 Vaccine (5 - Pfizer series) 06/02/2021   OPHTHALMOLOGY EXAM  06/14/2021    Colorectal cancer screening: No longer required.   Mammogram status: No longer required due to age.  Bone Density status: Completed 02/22/2017. Results reflect: Bone density results: OSTEOPENIA. Repeat every 3 years.  Lung Cancer Screening: (Low Dose CT Chest recommended if Age 79-80years, 30 pack-year currently smoking OR have quit w/in 15years.) does  qualify.   Lung Cancer Screening Referral: Patient Declines.  Additional Screening:  Hepatitis C Screening: does qualify; Completed OVERDUE  Vision Screening: Recommended annual ophthalmology exams for early detection of glaucoma and other disorders of the eye. Is the patient up to date with their annual eye exam?  Yes  Who is the provider or what is the name of the office in which the patient attends annual eye exams[facility] pt is not established with a provider, would they like to be referred to a provider to establish care?  N/A .   Dental Screening: Recommended annual dental exams for proper oral hygiene  Community Resource Referral / Chronic Care Management: CRR required this visit?  No   CCM required this visit?  No      Plan:     I have personally reviewed and noted the following in the patient's chart:   Medical and social history Use of alcohol, tobacco or illicit drugs  Current medications and supplements including opioid prescriptions. Patient is not currently taking opioid prescriptions. Functional ability and status Nutritional status Physical activity Advanced directives List of other physicians Hospitalizations, surgeries, and ER visits in previous 12 months Vitals Screenings to include cognitive, depression, and falls Referrals and appointments  In addition, I have reviewed and discussed with patient certain preventive protocols, quality metrics, and best practice recommendations. A written personalized care plan for preventive services as well as general preventive health recommendations were provided to patient.     CClista Bernhardt CRuth  08/30/2022   Nurse Notes: None.

## 2022-09-28 ENCOUNTER — Telehealth: Payer: Self-pay | Admitting: *Deleted

## 2022-09-28 ENCOUNTER — Encounter: Payer: Self-pay | Admitting: Family Medicine

## 2022-09-28 ENCOUNTER — Ambulatory Visit (INDEPENDENT_AMBULATORY_CARE_PROVIDER_SITE_OTHER): Payer: Medicare Other | Admitting: Family Medicine

## 2022-09-28 VITALS — BP 128/64 | HR 76 | Ht 62.0 in | Wt 118.0 lb

## 2022-09-28 DIAGNOSIS — F17200 Nicotine dependence, unspecified, uncomplicated: Secondary | ICD-10-CM

## 2022-09-28 DIAGNOSIS — I10 Essential (primary) hypertension: Secondary | ICD-10-CM

## 2022-09-28 DIAGNOSIS — G2581 Restless legs syndrome: Secondary | ICD-10-CM

## 2022-09-28 DIAGNOSIS — Z23 Encounter for immunization: Secondary | ICD-10-CM | POA: Diagnosis not present

## 2022-09-28 DIAGNOSIS — R7303 Prediabetes: Secondary | ICD-10-CM | POA: Diagnosis not present

## 2022-09-28 DIAGNOSIS — R634 Abnormal weight loss: Secondary | ICD-10-CM | POA: Diagnosis not present

## 2022-09-28 DIAGNOSIS — K259 Gastric ulcer, unspecified as acute or chronic, without hemorrhage or perforation: Secondary | ICD-10-CM

## 2022-09-28 MED ORDER — GABAPENTIN 100 MG PO CAPS: ORAL_CAPSULE | ORAL | 1 refills | Status: DC

## 2022-09-28 MED ORDER — LISINOPRIL-HYDROCHLOROTHIAZIDE 20-12.5 MG PO TABS: ORAL_TABLET | ORAL | 1 refills | Status: DC

## 2022-09-28 MED ORDER — PANTOPRAZOLE SODIUM 40 MG PO TBEC: DELAYED_RELEASE_TABLET | ORAL | 1 refills | Status: DC

## 2022-09-28 NOTE — Progress Notes (Signed)
Date:  09/28/2022   Name:  Angelica Walker   DOB:  January 18, 1941   MRN:  [id number]   Chief Complaint: Gastroesophageal Reflux, Hypertension, and restless leg  Gastroesophageal Reflux She reports no abdominal pain, no belching, no chest pain, no choking, no coughing, no dysphagia, no early satiety, no globus sensation, no heartburn, no hoarse voice, no nausea, no sore throat, no stridor, no tooth decay, no water brash or no wheezing. RESTLESS LEG. This is a chronic problem. The current episode started in the past 7 days. The problem has been gradually improving. The symptoms are aggravated by certain foods. Associated symptoms include fatigue. She has tried a PPI for the symptoms. The treatment provided moderate relief.  Hypertension This is a chronic problem. The current episode started more than 1 year ago. The problem is controlled. Pertinent negatives include no chest pain, headaches, orthopnea, palpitations or shortness of breath. The current treatment provides moderate improvement. There are no compliance problems.  There is no history of angina, kidney disease, CAD/MI, CVA, heart failure, left ventricular hypertrophy, PVD or retinopathy. There is no history of chronic renal disease, a hypertension causing med or renovascular disease.  Neurologic Problem The patient's pertinent negatives include no weakness. Primary symptoms comment: RESTLESS LEG. Associated symptoms include dizziness, fatigue and light-headedness. Pertinent negatives include no abdominal pain, back pain, chest pain, fever, headaches, nausea, palpitations or shortness of breath.    Lab Results  Component Value Date   NA 141 03/28/2022   K 4.0 03/28/2022   CO2 24 03/28/2022   GLUCOSE 93 03/28/2022   BUN 18 03/28/2022   CREATININE 0.81 03/28/2022   CALCIUM 9.8 03/28/2022   EGFR 74 03/28/2022   GFRNONAA 85 10/07/2020   Lab Results  Component Value Date   CHOL 212 (H) 03/28/2022   HDL 49 03/28/2022   LDLCALC 136 (H)  03/28/2022   TRIG 152 (H) 03/28/2022   CHOLHDL 3.5 12/19/2017   Lab Results  Component Value Date   TSH 1.900 12/19/2017   Lab Results  Component Value Date   HGBA1C 5.8 (H) 03/28/2022   Lab Results  Component Value Date   WBC 10.1 06/06/2022   HGB 11.7 (L) 06/06/2022   HCT 36.3 06/06/2022   MCV 86.6 06/06/2022   PLT 321 06/06/2022   Lab Results  Component Value Date   ALT 8 12/19/2017   AST 18 12/19/2017   ALKPHOS 87 12/19/2017   BILITOT 0.4 12/19/2017   No results found for: "25OHVITD2", "25OHVITD3", "VD25OH"   Review of Systems  Constitutional:  Positive for fatigue. Negative for chills, fever and unexpected weight change.  HENT:  Negative for congestion, ear discharge, ear pain, hoarse voice, rhinorrhea, sinus pressure, sneezing and sore throat.   Respiratory:  Negative for cough, choking, chest tightness, shortness of breath, wheezing and stridor.   Cardiovascular:  Negative for chest pain, palpitations and orthopnea.  Gastrointestinal:  Negative for abdominal pain, blood in stool, constipation, diarrhea, dysphagia, heartburn and nausea.  Genitourinary:  Negative for dysuria, flank pain, frequency, hematuria, urgency and vaginal discharge.  Musculoskeletal:  Negative for arthralgias, back pain and myalgias.  Skin:  Negative for rash.  Neurological:  Positive for dizziness and light-headedness. Negative for weakness and headaches.  Hematological:  Negative for adenopathy. Does not bruise/bleed easily.  Psychiatric/Behavioral:  Negative for dysphoric mood. The patient is not nervous/anxious.     Patient Active Problem List   Diagnosis Date Noted   Iron deficiency anemia due to chronic blood loss  07/02/2018   Smoker 03/18/2018   RLS (restless legs syndrome) 01/18/2018   Anemia due to blood loss 12/20/2017   Prediabetes 12/19/2017   Hyperlipidemia, unspecified 12/19/2017   Hypertension, essential, benign 12/19/2017   B12 deficiency 12/19/2017   DDD (degenerative  disc disease), lumbar 12/19/2017   Multiple gastric ulcers 12/19/2017   COPD (chronic obstructive pulmonary disease) ([place]) 12/21/2014    Allergies  Allergen Reactions   Simvastatin Other (See Comments)    Past Surgical History:  Procedure Laterality Date   CATARACT EXTRACTION W/PHACO Left 06/29/2020   Procedure: CATARACT EXTRACTION PHACO AND INTRAOCULAR LENS PLACEMENT (IOC) LEFT 7.20 00:42.7;  Surgeon: Eulogio Bear, MD;  Location: [place];  Service: Ophthalmology;  Laterality: Left;   CATARACT EXTRACTION W/PHACO Right 07/26/2020   Procedure: CATARACT EXTRACTION PHACO AND INTRAOCULAR LENS PLACEMENT (IOC) RIGHT 7.97  00:53.0;  Surgeon: Eulogio Bear, MD;  Location: [place];  Service: Ophthalmology;  Laterality: Right;   COLONOSCOPY WITH PROPOFOL N/A 12/07/2017   Procedure: COLONOSCOPY WITH PROPOFOL;  Surgeon: Lollie Sails, MD;  Location: [facility] ENDOSCOPY;  Service: Endoscopy;  Laterality: N/A;   ESOPHAGOGASTRODUODENOSCOPY (EGD) WITH PROPOFOL N/A 12/07/2017   Procedure: ESOPHAGOGASTRODUODENOSCOPY (EGD) WITH PROPOFOL;  Surgeon: Lollie Sails, MD;  Location: [facility] ENDOSCOPY;  Service: Endoscopy;  Laterality: N/A;   ESOPHAGOGASTRODUODENOSCOPY (EGD) WITH PROPOFOL N/A 03/21/2018   Procedure: ESOPHAGOGASTRODUODENOSCOPY (EGD) WITH PROPOFOL;  Surgeon: Lollie Sails, MD;  Location: [facility] ENDOSCOPY;  Service: Endoscopy;  Laterality: N/A;   ESOPHAGOGASTRODUODENOSCOPY (EGD) WITH PROPOFOL N/A 07/22/2018   Procedure: ESOPHAGOGASTRODUODENOSCOPY (EGD) WITH PROPOFOL;  Surgeon: Lollie Sails, MD;  Location: [facility] ENDOSCOPY;  Service: Endoscopy;  Laterality: N/A;    Social History   Tobacco Use   Smoking status: Every Day    Packs/day: 1.00    Years: 54.00    Total pack years: 54.00    Types: Cigarettes   Smokeless tobacco: Never   Tobacco comments:    patches and pills discussed   Vaping Use   Vaping Use: Never used  Substance Use Topics   Alcohol use:  No   Drug use: No     Medication list has been reviewed and updated.  Current Meds  Medication Sig   acetaminophen (TYLENOL) 500 MG tablet Take 1,000 mg by mouth 2 (two) times daily as needed.   Ascorbic Acid (VITAMIN C) 100 MG tablet Take 100 mg by mouth daily.   Calcium Carbonate-Vitamin D 600-200 MG-UNIT TABS Take by mouth.   Cyanocobalamin (B-12) 1000 MCG TABS Take 1 tablet by mouth daily.   gabapentin (NEURONTIN) 100 MG capsule TAKE 1 CAPSULE BY MOUTH AT BEDTIME.   GLUCOSAMINE-CHONDROITIN PO Take 3 tablets by mouth.   lisinopril-hydrochlorothiazide (ZESTORETIC) 20-12.5 MG tablet TAKE ONE (1) TABLET BY MOUTH ONCE DAILY   Melatonin 10 MG TABS Take 1 tablet by mouth at bedtime.   MELATONIN PO Take by mouth at bedtime as needed.   pantoprazole (PROTONIX) 40 MG tablet TAKE (1) TABLET BY MOUTH EVERY DAY       09/28/2022    1:52 PM 03/28/2022    1:20 PM 04/07/2021   11:33 AM 10/07/2020   10:43 AM  GAD 7 : Generalized Anxiety Score  Nervous, Anxious, on Edge 0 0 0 0  Control/stop worrying 0 0 0 0  Worry too much - different things 0 0 0 0  Trouble relaxing 0 0 0 0  Restless 0 0 0 0  Easily annoyed or irritable 0 0 0 0  Afraid -  awful might happen 0 0 0 0  Total GAD 7 Score 0 0 0 0  Anxiety Difficulty Not difficult at all Not difficult at all         09/28/2022    1:52 PM 08/30/2022    3:39 PM 03/28/2022    1:20 PM  Depression screen PHQ 2/9  Decreased Interest 0 0 0  Down, Depressed, Hopeless 0 0 0  PHQ - 2 Score 0 0 0  Altered sleeping 0 0 0  Tired, decreased energy 0 0 0  Change in appetite 0 0 0  Feeling bad or failure about yourself  0 0 0  Trouble concentrating 0 3 0  Moving slowly or fidgety/restless 0 0 0  Suicidal thoughts 0 0 0  PHQ-9 Score 0 3 0  Difficult doing work/chores Not difficult at all Not difficult at all Not difficult at all    BP Readings from Last 3 Encounters:  09/28/22 128/64  06/22/22 111/71  06/14/22 (!) 107/91    Physical  Exam Vitals and nursing note reviewed. Exam conducted with a chaperone present.  Constitutional:      General: She is not in acute distress.    Appearance: She is not diaphoretic.  HENT:     Head: Normocephalic and atraumatic.     Right Ear: Tympanic membrane, ear canal and external ear normal. There is no impacted cerumen.     Left Ear: Tympanic membrane, ear canal and external ear normal. There is no impacted cerumen.     Nose: Nose normal. No congestion or rhinorrhea.     Mouth/Throat:     Mouth: Mucous membranes are moist.  Eyes:     General:        Right eye: No discharge.        Left eye: No discharge.     Conjunctiva/sclera: Conjunctivae normal.     Pupils: Pupils are equal, round, and reactive to light.  Neck:     Thyroid: No thyromegaly.     Vascular: No JVD.  Cardiovascular:     Rate and Rhythm: Normal rate and regular rhythm.     Heart sounds: Normal heart sounds. No murmur heard.    No friction rub. No gallop.  Pulmonary:     Effort: Pulmonary effort is normal.     Breath sounds: Normal breath sounds. No wheezing or rhonchi.  Abdominal:     General: Bowel sounds are normal.     Palpations: Abdomen is soft. There is no mass.     Tenderness: There is no abdominal tenderness. There is no guarding or rebound.  Musculoskeletal:        General: Normal range of motion.     Cervical back: Normal range of motion and neck supple.  Lymphadenopathy:     Cervical: No cervical adenopathy.  Skin:    General: Skin is warm and dry.  Neurological:     Mental Status: She is alert.     Deep Tendon Reflexes: Reflexes are normal and symmetric.     Wt Readings from Last 3 Encounters:  09/28/22 118 lb (53.5 kg)  03/28/22 125 lb (56.7 kg)  10/12/21 123 lb 12.8 oz (56.2 kg)    BP 128/64   Pulse 76   Ht _0  (1.575 m)   Wt 118 lb (53.5 kg)   BMI 21.58 kg/m   Assessment and Plan: 1. Hypertension, essential, benign Chronic.  Controlled.  Stable.  Blood pressure is 128/64.   Continue lisinopril hydrochlorothiazide 20-12.5 mg  once a day.  We will check renal function panel for electrolytes and GFR.  We will recheck in 6 months. - lisinopril-hydrochlorothiazide (ZESTORETIC) 20-12.5 MG tablet; TAKE ONE (1) TABLET BY MOUTH ONCE DAILY  Dispense: 90 tablet; Refill: 1 - Renal Function Panel  2. Multiple gastric ulcers Chronic.  Controlled.  Stable.  Symptoms are controlled with pantoprazole 40 mg once a day.  We will recheck in 6 months. - pantoprazole (PROTONIX) 40 MG tablet; TAKE (1) TABLET BY MOUTH EVERY DAY  Dispense: 90 tablet; Refill: 1  3. RLS (restless legs syndrome) Chronic.  Controlled.  Stable.  Symptomatic relief at night with 100 mg gabapentin nightly.  We will recheck in 6 months. - gabapentin (NEURONTIN) 100 MG capsule; TAKE 1 CAPSULE BY MOUTH AT BEDTIME.  Dispense: 90 capsule; Refill: 1  4. Prediabetes Chronic.  Controlled.  This is diet controlled and will check A1c for current level of control. - HgB A1c  5. Smoker Patient has been advised of the health risks of smoking and counseled concerning cessation of tobacco products. I spent over 3 minutes for discussion and to answer questions.  It was noted that patient was not notified or did not go to her screening for lung cancer.  We have called and we have rescheduled this for patient with the cancer center to call patient with reappointment.  We will also check urinalysis microscopic at this time to rule out any hematuria. - Urinalysis, microscopic only  6. Need for immunization against influenza Discussed and administered - Flu Vaccine QUAD High Dose(Fluad)     Otilio Miu, MD

## 2022-09-28 NOTE — Telephone Encounter (Signed)
Left message for patient to call back to schedule follow up lung cancer screening CT scan.  

## 2022-09-29 ENCOUNTER — Other Ambulatory Visit: Payer: Self-pay

## 2022-09-29 ENCOUNTER — Encounter: Payer: Self-pay | Admitting: Family Medicine

## 2022-09-29 DIAGNOSIS — Z87891 Personal history of nicotine dependence: Secondary | ICD-10-CM

## 2022-09-29 DIAGNOSIS — Z122 Encounter for screening for malignant neoplasm of respiratory organs: Secondary | ICD-10-CM

## 2022-09-29 DIAGNOSIS — F17210 Nicotine dependence, cigarettes, uncomplicated: Secondary | ICD-10-CM

## 2022-09-29 LAB — RENAL FUNCTION PANEL
Albumin: 4.7 g/dL (ref 3.8–4.8)
BUN/Creatinine Ratio: 21 (ref 12–28)
BUN: 18 mg/dL (ref 8–27)
CO2: 22 mmol/L (ref 20–29)
Calcium: 10.2 mg/dL (ref 8.7–10.3)
Chloride: 97 mmol/L (ref 96–106)
Creatinine, Ser: 0.85 mg/dL (ref 0.57–1.00)
Glucose: 118 mg/dL — ABNORMAL HIGH (ref 70–99)
Phosphorus: 3.7 mg/dL (ref 3.0–4.3)
Potassium: 4.3 mmol/L (ref 3.5–5.2)
Sodium: 138 mmol/L (ref 134–144)
eGFR: 70 mL/min/{1.73_m2} (ref >59)

## 2022-09-29 LAB — HEMOGLOBIN A1C
Est. average glucose Bld gHb Est-mCnc: 123 mg/dL
Hgb A1c MFr Bld: 5.9 % — ABNORMAL HIGH (ref 4.8–5.6)

## 2022-09-29 LAB — URINALYSIS, MICROSCOPIC ONLY
Bacteria, UA: NONE SEEN
Casts: NONE SEEN /lpf
Epithelial Cells (non renal): NONE SEEN /hpf (ref 0–10)
RBC, Urine: NONE SEEN /hpf (ref 0–2)
WBC, UA: NONE SEEN /hpf (ref 0–5)

## 2022-09-29 NOTE — Telephone Encounter (Signed)
Returned call from VM.  No anwer. Left message.  Placed new LDCT order for scheduling

## 2022-10-02 ENCOUNTER — Telehealth: Payer: Self-pay | Admitting: *Deleted

## 2022-10-02 NOTE — Telephone Encounter (Signed)
Called and spoke with pt and let her know that her insurance has denied coverage for the lung cancer screening CT due to her current age of 20. I explained that some medicare plans will cover up until age 79 and some will cover up to age 66 and her carrier is one that stops coverage at age 41. PT verbalized understanding and is aware that Dr Ronnald Ramp office will contact her for any further follow up that is needed.  Sending this to Dr Ronnald Ramp as an Juluis Rainier.

## 2022-10-05 ENCOUNTER — Other Ambulatory Visit: Payer: Self-pay

## 2022-10-05 DIAGNOSIS — F17200 Nicotine dependence, unspecified, uncomplicated: Secondary | ICD-10-CM

## 2022-10-05 DIAGNOSIS — R918 Other nonspecific abnormal finding of lung field: Secondary | ICD-10-CM

## 2022-10-06 ENCOUNTER — Telehealth: Payer: Self-pay | Admitting: Family Medicine

## 2022-10-06 NOTE — Telephone Encounter (Signed)
Copied from [place] [id number]. Topic: Referral - Status >> Oct 06, 2022  2:55 PM Tiffany B wrote: Caller wanted to inform PCP Dr. Mortimer Fries does not have any availability until December but can schedule patient with Dr. Patsey Berthold sooner then December possible in the next 2 weeks. Requesting a call back back

## 2022-10-09 ENCOUNTER — Telehealth: Payer: Self-pay | Admitting: Family Medicine

## 2022-10-09 NOTE — Telephone Encounter (Signed)
Copied from [place] [id number]. Topic: General - Other >> Oct 09, 2022 12:30 PM Ludger Nutting wrote: Patient called to speak with Baxter Flattery. Please follow up with patient.

## 2022-10-10 ENCOUNTER — Ambulatory Visit: Payer: Medicare Other

## 2022-10-13 ENCOUNTER — Inpatient Hospital Stay: Payer: Medicare Other

## 2022-10-13 ENCOUNTER — Inpatient Hospital Stay: Payer: Medicare Other | Admitting: Oncology

## 2022-10-17 ENCOUNTER — Institutional Professional Consult (permissible substitution): Payer: Medicare Other | Admitting: Pulmonary Disease

## 2022-11-08 ENCOUNTER — Ambulatory Visit: Payer: Medicare Other | Admitting: Oncology

## 2022-11-08 ENCOUNTER — Other Ambulatory Visit: Payer: Medicare Other

## 2022-11-21 ENCOUNTER — Ambulatory Visit: Payer: Medicare Other | Admitting: Student in an Organized Health Care Education/Training Program

## 2022-11-21 ENCOUNTER — Encounter: Payer: Self-pay | Admitting: Student in an Organized Health Care Education/Training Program

## 2022-11-21 VITALS — BP 132/76 | HR 110 | Temp 98.0°F | Ht 63.0 in | Wt 120.4 lb

## 2022-11-21 DIAGNOSIS — F17210 Nicotine dependence, cigarettes, uncomplicated: Secondary | ICD-10-CM | POA: Diagnosis not present

## 2022-11-21 DIAGNOSIS — Z122 Encounter for screening for malignant neoplasm of respiratory organs: Secondary | ICD-10-CM

## 2022-11-21 NOTE — Progress Notes (Signed)
Synopsis: Referred in  Juline Patch, MD  Assessment & Plan:   1. Moderate smoker (20 or less per day) 2. Lung Cancer Screening  Patient has a long standing history of smoking and is 81 years of age. She qualifies for low dose CT for lung cancer screening per USPSTF guidelines from 2021. I will order a low dose CT for screening for her today. Should this CT be normal, it would be the last scan given she would turn 82 in May.  I did discuss her symptoms of exertional dyspnea with her. The patient feels fine overall and is not bothered by any symptoms. I offered her a PRN inhaler and she would rather not use one. She would also prefer to minimize any tests and interventions, and would rather not have any PFT's. I will hold off on further testing and did tell the patient to reach out to [place] as needed should she develop worsening symptoms.  - CT CHEST LUNG CA SCREEN LOW DOSE W/O CM; Future   Return if symptoms worsen or fail to improve.  I spent 45 minutes caring for this patient today, including preparing to see the patient, obtaining a medical history , reviewing a separately obtained history, performing a medically appropriate examination and/or evaluation, counseling and educating the patient/family/caregiver, ordering medications, tests, or procedures, and documenting clinical information in the electronic health record  Armando Reichert, MD [facility] 11/21/2022 2:25 PM    End of visit medications:  No orders of the defined types were placed in this encounter.    Current Outpatient Medications:    acetaminophen (TYLENOL) 500 MG tablet, Take 1,000 mg by mouth 2 (two) times daily as needed., Disp: , Rfl:    Ascorbic Acid (VITAMIN C) 100 MG tablet, Take 100 mg by mouth daily., Disp: , Rfl:    Calcium Carbonate-Vitamin D 600-200 MG-UNIT TABS, Take by mouth., Disp: , Rfl:    Cyanocobalamin (B-12) 1000 MCG TABS, Take 1 tablet by mouth daily., Disp: 30 tablet, Rfl:     gabapentin (NEURONTIN) 100 MG capsule, TAKE 1 CAPSULE BY MOUTH AT BEDTIME., Disp: 90 capsule, Rfl: 1   GLUCOSAMINE-CHONDROITIN PO, Take 3 tablets by mouth., Disp: , Rfl:    lisinopril-hydrochlorothiazide (ZESTORETIC) 20-12.5 MG tablet, TAKE ONE (1) TABLET BY MOUTH ONCE DAILY, Disp: 90 tablet, Rfl: 1   Melatonin 10 MG TABS, Take 1 tablet by mouth at bedtime., Disp: , Rfl:    MELATONIN PO, Take by mouth at bedtime as needed., Disp: , Rfl:    pantoprazole (PROTONIX) 40 MG tablet, TAKE (1) TABLET BY MOUTH EVERY DAY, Disp: 90 tablet, Rfl: 1   Subjective:   PATIENT ID: Angelica Walker GENDER: female DOB: Jan 25, 1941, MRN: [id number]  Chief Complaint  Patient presents with   pulmonary consult    CT 06/2021-occ dry cough.     HPI  Angelica Walker is a pleasant 81 year old female smoker presenting to clinic for an evaluation. She is followed closely by her primary care physician and was undergoing lung cancer screening by our group. She most recently had her LDCT denied by insurance.  She tells me that she is fairly asymptomatic and has plenty of energy. She only feels short of breath with severe exertion, something that does not bother her at all. She is able to do all the activities of her daily living. She reports a chronic cough that is sporadic, and denies any wheezing, chest pain, or chest tightness. She does not have  any episodes of bronchitis and feels that she rarely gets ill with URTI's. She is a life long smoker, with over 50 pack years, and continues to smoke. She now smokes up to a pack a day and does not intend on quitting. She used to work in a warehouse in the past and denies significant exposures.  Ancillary information including prior medications, full medical/surgical/family/social histories, and PFTs (when available) are listed below and have been reviewed.   Review of Systems  Constitutional:  Negative for chills, diaphoresis, fever and malaise/fatigue.  HENT:  Negative for hearing  loss.   Respiratory:  Positive for cough. Negative for hemoptysis, sputum production, shortness of breath and wheezing.   Cardiovascular:  Negative for chest pain, palpitations, leg swelling and PND.  Gastrointestinal:  Negative for abdominal pain, nausea and vomiting.  Neurological:  Negative for dizziness.  Psychiatric/Behavioral:  Negative for depression.      Objective:   Vitals:   11/21/22 1406  BP: 132/76  Pulse: (!) 110  Temp: 98 F (36.7 C)  TempSrc: Temporal  SpO2: 97%  Weight: 120 lb 6.4 oz (54.6 kg)  Height: '5\' 3"'$  (1.6 m)   97% on RA  BMI Readings from Last 3 Encounters:  11/21/22 21.33 kg/m  09/28/22 21.58 kg/m  03/28/22 22.86 kg/m   Wt Readings from Last 3 Encounters:  11/21/22 120 lb 6.4 oz (54.6 kg)  09/28/22 118 lb (53.5 kg)  03/28/22 125 lb (56.7 kg)    Physical Exam Constitutional:      General: She is not in acute distress.    Appearance: Normal appearance. She is not ill-appearing or toxic-appearing.  HENT:     Mouth/Throat:     Mouth: Mucous membranes are moist.  Eyes:     Extraocular Movements: Extraocular movements intact.     Pupils: Pupils are equal, round, and reactive to light.  Cardiovascular:     Rate and Rhythm: Normal rate and regular rhythm.     Pulses: Normal pulses.     Heart sounds: Normal heart sounds.  Pulmonary:     Effort: Pulmonary effort is normal.     Breath sounds: Normal breath sounds.  Abdominal:     Palpations: Abdomen is soft.  Neurological:     General: No focal deficit present.     Mental Status: She is alert. Mental status is at baseline.       Ancillary Information    Past Medical History:  Diagnosis Date   Anemia    COPD (chronic obstructive pulmonary disease) (HCC)    GERD (gastroesophageal reflux disease)    Hyperlipidemia    Hypertension    Multilevel degenerative disc disease    Serum lipids high      Family History  Problem Relation Age of Onset   Breast cancer Maternal Aunt       Past Surgical History:  Procedure Laterality Date   CATARACT EXTRACTION W/PHACO Left 06/29/2020   Procedure: CATARACT EXTRACTION PHACO AND INTRAOCULAR LENS PLACEMENT (IOC) LEFT 7.20 00:42.7;  Surgeon: Eulogio Bear, MD;  Location: [place];  Service: Ophthalmology;  Laterality: Left;   CATARACT EXTRACTION W/PHACO Right 07/26/2020   Procedure: CATARACT EXTRACTION PHACO AND INTRAOCULAR LENS PLACEMENT (IOC) RIGHT 7.97  00:53.0;  Surgeon: Eulogio Bear, MD;  Location: [place];  Service: Ophthalmology;  Laterality: Right;   COLONOSCOPY WITH PROPOFOL N/A 12/07/2017   Procedure: COLONOSCOPY WITH PROPOFOL;  Surgeon: Lollie Sails, MD;  Location: [facility] ENDOSCOPY;  Service: Endoscopy;  Laterality: N/A;  ESOPHAGOGASTRODUODENOSCOPY (EGD) WITH PROPOFOL N/A 12/07/2017   Procedure: ESOPHAGOGASTRODUODENOSCOPY (EGD) WITH PROPOFOL;  Surgeon: Lollie Sails, MD;  Location: [facility] ENDOSCOPY;  Service: Endoscopy;  Laterality: N/A;   ESOPHAGOGASTRODUODENOSCOPY (EGD) WITH PROPOFOL N/A 03/21/2018   Procedure: ESOPHAGOGASTRODUODENOSCOPY (EGD) WITH PROPOFOL;  Surgeon: Lollie Sails, MD;  Location: [facility] ENDOSCOPY;  Service: Endoscopy;  Laterality: N/A;   ESOPHAGOGASTRODUODENOSCOPY (EGD) WITH PROPOFOL N/A 07/22/2018   Procedure: ESOPHAGOGASTRODUODENOSCOPY (EGD) WITH PROPOFOL;  Surgeon: Lollie Sails, MD;  Location: [facility] ENDOSCOPY;  Service: Endoscopy;  Laterality: N/A;    Social History   Socioeconomic History   Marital status: Divorced    Spouse name: Not on file   Number of children: 1   Years of education: some college   Highest education level: 12th grade  Occupational History   Occupation: Retired  Tobacco Use   Smoking status: Every Day    Packs/day: 1.00    Years: 54.00    Total pack years: 54.00    Types: Cigarettes   Smokeless tobacco: Never   Tobacco comments:    patches and pills discussed   Vaping Use   Vaping Use: Never used  Substance and Sexual  Activity   Alcohol use: No   Drug use: No   Sexual activity: Not Currently  Other Topics Concern   Not on file  Social History Narrative   Not on file   Social Determinants of Health   Financial Resource Strain: Low Risk  (08/30/2022)   Overall Financial Resource Strain (CARDIA)    Difficulty of Paying Living Expenses: Not hard at all  Food Insecurity: No Food Insecurity (08/30/2022)   Hunger Vital Sign    Worried About Running Out of Food in the Last Year: Never true    Ran Out of Food in the Last Year: Never true  Transportation Needs: No Transportation Needs (08/30/2022)   PRAPARE - Hydrologist (Medical): No    Lack of Transportation (Non-Medical): No  Physical Activity: Sufficiently Active (08/30/2022)   Exercise Vital Sign    Days of Exercise per Week: 7 days    Minutes of Exercise per Session: 60 min  Stress: No Stress Concern Present (08/30/2022)   [place]    Feeling of Stress : Not at all  Social Connections: Socially Isolated (08/30/2022)   Social Connection and Isolation Panel [NHANES]    Frequency of Communication with Friends and Family: More than three times a week    Frequency of Social Gatherings with Friends and Family: More than three times a week    Attends Religious Services: Never    Marine scientist or Organizations: No    Attends Archivist Meetings: Never    Marital Status: Never married  Intimate Partner Violence: Not At Risk (08/30/2022)   Humiliation, Afraid, Rape, and Kick questionnaire    Fear of Current or Ex-Partner: No    Emotionally Abused: No    Physically Abused: No    Sexually Abused: No     Allergies  Allergen Reactions   Simvastatin Other (See Comments)     CBC    Component Value Date/Time   WBC 10.1 06/06/2022 1038   RBC 4.19 06/06/2022 1038   HGB 11.7 (L) 06/06/2022 1038   HGB 9.3 (L) 03/18/2018 1109   HCT 36.3  06/06/2022 1038   HCT 32.0 (L) 03/18/2018 1109   PLT 321 06/06/2022 1038   PLT 465 (H) 03/18/2018 1109  MCV 86.6 06/06/2022 1038   MCV 75 (L) 03/18/2018 1109   MCH 27.9 06/06/2022 1038   MCHC 32.2 06/06/2022 1038   RDW 14.5 06/06/2022 1038   RDW 21.0 (H) 03/18/2018 1109   LYMPHSABS 2.4 06/06/2022 1038   MONOABS 0.8 06/06/2022 1038   EOSABS 0.1 06/06/2022 1038   BASOSABS 0.1 06/06/2022 1038    Pulmonary Functions Testing Results:     No data to display          Outpatient Medications Prior to Visit  Medication Sig Dispense Refill   acetaminophen (TYLENOL) 500 MG tablet Take 1,000 mg by mouth 2 (two) times daily as needed.     Ascorbic Acid (VITAMIN C) 100 MG tablet Take 100 mg by mouth daily.     Calcium Carbonate-Vitamin D 600-200 MG-UNIT TABS Take by mouth.     Cyanocobalamin (B-12) 1000 MCG TABS Take 1 tablet by mouth daily. 30 tablet    gabapentin (NEURONTIN) 100 MG capsule TAKE 1 CAPSULE BY MOUTH AT BEDTIME. 90 capsule 1   GLUCOSAMINE-CHONDROITIN PO Take 3 tablets by mouth.     lisinopril-hydrochlorothiazide (ZESTORETIC) 20-12.5 MG tablet TAKE ONE (1) TABLET BY MOUTH ONCE DAILY 90 tablet 1   Melatonin 10 MG TABS Take 1 tablet by mouth at bedtime.     MELATONIN PO Take by mouth at bedtime as needed.     pantoprazole (PROTONIX) 40 MG tablet TAKE (1) TABLET BY MOUTH EVERY DAY 90 tablet 1   No facility-administered medications prior to visit.

## 2022-11-22 ENCOUNTER — Other Ambulatory Visit: Payer: Medicare Other

## 2022-11-22 ENCOUNTER — Ambulatory Visit: Payer: Medicare Other | Admitting: Oncology

## 2022-11-27 ENCOUNTER — Other Ambulatory Visit: Payer: Self-pay | Admitting: *Deleted

## 2022-11-27 DIAGNOSIS — D509 Iron deficiency anemia, unspecified: Secondary | ICD-10-CM

## 2022-11-28 ENCOUNTER — Inpatient Hospital Stay: Payer: Medicare Other | Attending: Oncology

## 2022-11-28 ENCOUNTER — Inpatient Hospital Stay (HOSPITAL_BASED_OUTPATIENT_CLINIC_OR_DEPARTMENT_OTHER): Payer: Medicare Other | Admitting: Oncology

## 2022-11-28 ENCOUNTER — Encounter: Payer: Self-pay | Admitting: Oncology

## 2022-11-28 VITALS — BP 172/93 | Temp 97.3°F | Wt 119.0 lb

## 2022-11-28 DIAGNOSIS — D509 Iron deficiency anemia, unspecified: Secondary | ICD-10-CM

## 2022-11-28 DIAGNOSIS — F17210 Nicotine dependence, cigarettes, uncomplicated: Secondary | ICD-10-CM | POA: Diagnosis not present

## 2022-11-28 LAB — IRON AND TIBC
Iron: 106 ug/dL (ref 28–170)
Saturation Ratios: 31 % (ref 10.4–31.8)
TIBC: 344 ug/dL (ref 250–450)
UIBC: 238 ug/dL

## 2022-11-28 LAB — CBC
HCT: 39.7 % (ref 36.0–46.0)
Hemoglobin: 13.3 g/dL (ref 12.0–15.0)
MCH: 32 pg (ref 26.0–34.0)
MCHC: 33.5 g/dL (ref 30.0–36.0)
MCV: 95.4 fL (ref 80.0–100.0)
Platelets: 253 10*3/uL (ref 150–400)
RBC: 4.16 MIL/uL (ref 3.87–5.11)
RDW: 12.2 % (ref 11.5–15.5)
WBC: 8.2 10*3/uL (ref 4.0–10.5)
nRBC: 0 % (ref 0.0–0.2)

## 2022-11-28 LAB — FERRITIN: Ferritin: 122 ng/mL (ref 11–307)

## 2022-11-28 NOTE — Progress Notes (Signed)
Hematology/Oncology Consult note [facility]  [phone] [phone]  Patient Care Team: Juline Patch, MD as PCP - General (Family Medicine) Ok Edwards, NP as Nurse Practitioner (Gastroenterology) Efrain Sella, MD as Consulting Physician (Gastroenterology) Sindy Guadeloupe, MD as Consulting Physician (Oncology)   Name of the patient: Angelica Walker  [id number]  [birth year]   Date of visit: 11/28/22  Diagnosis-iron deficiency anemia  Chief complaint/ Reason for visit-routine for low up of iron deficiency anemia  Heme/Onc history: Patient is a 81 year old female with history of iron deficiency anemia secondary to gastric ulcer and GI bleeding back in April 2019.EGD on 12/07/2017 revealed  Z-line variable.  There was a non-obstructing non-bleeding gastric ulcer with no stigmata of bleeding. The duodenum was normal. Biopsies showed chronic nonspecific gastritis.  There was no H. pylori, dysplasia or malignancy. EGD on 03/21/2018 revealed Z-line variable.  There was erosive gastritis with healing ulceration in the stomach.  There was no H. pylori, dysplasia or malignancy.  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 EGD on 07/22/2018 revealed Z-line variable.  There was non-bleeding gastric ulcer with no stigmata of bleeding.  Acquired deformity in the gastric body (posterior wall).  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      Colonoscopy on 12/07/2017 revealed three 1 to 2 mm polyps in the rectum, removed. Diverticulosis in the sigmoid colon. Tortuous colon. 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  Pathology revealed 3 hyperplastic polyps.  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      She has required IV iron periodically.   She  also gets low-dose screening CT chest on a yearly basis given her history of smoking  Interval history-patient reports ongoing fatigue.  Denies any blood loss in her stool or urine.  ECOG PS- 1 Pain scale- 0   Review of systems- Review of Systems  Constitutional:  Positive for malaise/fatigue. Negative for chills, fever and weight loss.  HENT:  Negative for congestion, ear discharge and nosebleeds.   Eyes:  Negative for blurred vision.  Respiratory:  Negative for cough, hemoptysis, sputum production, shortness of breath and wheezing.   Cardiovascular:  Negative for chest pain, palpitations, orthopnea and claudication.  Gastrointestinal:  Negative for abdominal pain, blood in stool, constipation, diarrhea, heartburn, melena, nausea and vomiting.  Genitourinary:  Negative for dysuria, flank pain, frequency, hematuria and urgency.  Musculoskeletal:  Negative for back pain, joint pain and myalgias.  Skin:  Negative for rash.  Neurological:  Negative for dizziness, tingling, focal weakness, seizures, weakness and headaches.  Endo/Heme/Allergies:  Does not bruise/bleed easily.  Psychiatric/Behavioral:  Negative for depression and suicidal ideas. The patient does not have insomnia.       Allergies  Allergen Reactions   Simvastatin Other (See Comments)     Past Medical History:  Diagnosis Date   Anemia    COPD (chronic obstructive pulmonary disease) (HCC)    GERD (gastroesophageal reflux disease)    Hyperlipidemia    Hypertension    Multilevel degenerative disc disease    Serum lipids high      Past Surgical History:  Procedure Laterality Date   CATARACT EXTRACTION W/PHACO Left 06/29/2020   Procedure: CATARACT EXTRACTION PHACO AND INTRAOCULAR LENS PLACEMENT (IOC) LEFT 7.20 00:42.7;  Surgeon: Eulogio Bear, MD;  Location: [place];  Service: Ophthalmology;  Laterality: Left;   CATARACT EXTRACTION W/PHACO Right 07/26/2020   Procedure: CATARACT EXTRACTION PHACO AND  INTRAOCULAR LENS PLACEMENT (IOC) RIGHT 7.97  00:53.0;  Surgeon: Eulogio Bear, MD;  Location: [place];  Service: Ophthalmology;  Laterality: Right;   COLONOSCOPY WITH PROPOFOL N/A 12/07/2017   Procedure: COLONOSCOPY WITH PROPOFOL;  Surgeon: Lollie Sails, MD;  Location: [facility] ENDOSCOPY;  Service: Endoscopy;  Laterality: N/A;   ESOPHAGOGASTRODUODENOSCOPY (EGD) WITH PROPOFOL N/A 12/07/2017   Procedure: ESOPHAGOGASTRODUODENOSCOPY (EGD) WITH PROPOFOL;  Surgeon: Lollie Sails, MD;  Location: [facility] ENDOSCOPY;  Service: Endoscopy;  Laterality: N/A;   ESOPHAGOGASTRODUODENOSCOPY (EGD) WITH PROPOFOL N/A 03/21/2018   Procedure: ESOPHAGOGASTRODUODENOSCOPY (EGD) WITH PROPOFOL;  Surgeon: Lollie Sails, MD;  Location: [facility] ENDOSCOPY;  Service: Endoscopy;  Laterality: N/A;   ESOPHAGOGASTRODUODENOSCOPY (EGD) WITH PROPOFOL N/A 07/22/2018   Procedure: ESOPHAGOGASTRODUODENOSCOPY (EGD) WITH PROPOFOL;  Surgeon: Lollie Sails, MD;  Location: [facility] ENDOSCOPY;  Service: Endoscopy;  Laterality: N/A;    Social History   Socioeconomic History   Marital status: Divorced    Spouse name: Not on file   Number of children: 1   Years of education: some college   Highest education level: 12th grade  Occupational History   Occupation: Retired  Tobacco Use   Smoking status: Every Day    Packs/day: 1.00    Years: 54.00    Total pack years: 54.00    Types: Cigarettes   Smokeless tobacco: Never   Tobacco comments:    patches and pills discussed   Vaping Use   Vaping Use: Never used  Substance and Sexual Activity   Alcohol use: No   Drug use: No   Sexual activity: Not Currently  Other Topics Concern   Not on file  Social History Narrative   Not on file   Social Determinants of Health   Financial Resource Strain: Low Risk  (08/30/2022)   Overall Financial Resource Strain (CARDIA)    Difficulty of Paying Living Expenses: Not hard at all  Food Insecurity: No Food Insecurity (08/30/2022)    Hunger Vital Sign    Worried About Running Out of Food in the Last Year: Never true    Ran Out of Food in the Last Year: Never true  Transportation Needs: No Transportation Needs (08/30/2022)   PRAPARE - Hydrologist (Medical): No    Lack of Transportation (Non-Medical): No  Physical Activity: Sufficiently Active (08/30/2022)   Exercise Vital Sign    Days of Exercise per Week: 7 days    Minutes of Exercise per Session: 60 min  Stress: No Stress Concern Present (08/30/2022)   [place]    Feeling of Stress : Not at all  Social Connections: Socially Isolated (08/30/2022)   Social Connection and Isolation Panel [NHANES]    Frequency of Communication with Friends and Family: More than three times a week    Frequency of Social Gatherings with Friends and Family: More than three times a week    Attends Religious Services: Never    Marine scientist or Organizations: No    Attends Archivist Meetings: Never    Marital Status: Never married  Intimate Partner Violence: Not At Risk (08/30/2022)   Humiliation, Afraid, Rape, and Kick questionnaire    Fear of Current or Ex-Partner: No    Emotionally Abused: No    Physically Abused: No    Sexually Abused: No    Family History  Problem Relation Age of Onset   Breast cancer Maternal Aunt      Current Outpatient Medications:    acetaminophen (TYLENOL) 500 MG tablet, Take 1,000 mg by mouth 2 (two) times daily as needed., Disp: , Rfl:    Ascorbic Acid (VITAMIN C) 100 MG tablet, Take 100 mg by mouth daily., Disp: , Rfl:    Calcium Carbonate-Vitamin D 600-200 MG-UNIT TABS, Take by mouth., Disp: , Rfl:    Cyanocobalamin (B-12) 1000 MCG TABS, Take 1 tablet by mouth daily., Disp: 30 tablet, Rfl:    gabapentin (NEURONTIN) 100 MG capsule, TAKE 1 CAPSULE BY MOUTH AT BEDTIME., Disp: 90 capsule, Rfl: 1   GLUCOSAMINE-CHONDROITIN PO, Take 3 tablets  by mouth., Disp: , Rfl:    lisinopril-hydrochlorothiazide (ZESTORETIC) 20-12.5 MG tablet, TAKE ONE (1) TABLET BY MOUTH ONCE DAILY, Disp: 90 tablet, Rfl: 1   Melatonin 10 MG TABS, Take 1 tablet by mouth at bedtime., Disp: , Rfl:    MELATONIN PO, Take by mouth at bedtime as needed., Disp: , Rfl:    pantoprazole (PROTONIX) 40 MG tablet, TAKE (1) TABLET BY MOUTH EVERY DAY, Disp: 90 tablet, Rfl: 1  Physical exam:  Vitals:   11/28/22 1310  BP: (!) 172/93  Temp: (!) 97.3 F (36.3 C)  TempSrc: Tympanic  Weight: 119 lb (54 kg)   Physical Exam Cardiovascular:     Rate and Rhythm: Normal rate and regular rhythm.     Heart sounds: Normal heart sounds.  Pulmonary:     Effort: Pulmonary effort is normal.     Breath sounds: Normal breath sounds.  Abdominal:     General: Bowel sounds are normal.     Palpations: Abdomen is soft.  Skin:    General: Skin is warm and dry.  Neurological:     Mental Status: She is alert and oriented to person, place, and time.         Latest Ref Rng & Units 09/28/2022    3:41 PM  CMP  Glucose 70 - 99 mg/dL 118   BUN 8 - 27 mg/dL 18   Creatinine 0.57 - 1.00 mg/dL 0.85   Sodium 134 - 144 mmol/L 138   Potassium 3.5 - 5.2 mmol/L 4.3   Chloride 96 - 106 mmol/L 97   CO2 20 - 29 mmol/L 22   Calcium 8.7 - 10.3 mg/dL 10.2       Latest Ref Rng & Units 11/28/2022   12:53 PM  CBC  WBC 4.0 - 10.5 K/uL 8.2   Hemoglobin 12.0 - 15.0 g/dL 13.3   Hematocrit 36.0 - 46.0 % 39.7   Platelets 150 - 400 K/uL 253      Assessment and plan- Patient is a 81 y.o. female with history of iron deficiency anemia here for routine follow-up  Patient's hemoglobin improved from 11.5-13.3 after receiving IV iron in June 2023.  Her iron studies are presently normal and she does not require any IV iron at this time.  I will repeat CBC ferritin and iron studies in 6 months in 1 year and see her back in 1 year   Visit Diagnosis 1. Iron deficiency anemia, unspecified iron deficiency  anemia type      Dr. Randa Evens, MD, MPH [facility] at [facility] [phone] 11/28/2022 3:02 PM

## 2022-11-30 ENCOUNTER — Ambulatory Visit
Admission: RE | Admit: 2022-11-30 | Discharge: 2022-11-30 | Disposition: A | Discharge status: Home / Self Care | Payer: Medicare Other | Source: Ambulatory Visit | Attending: Family Medicine | Admitting: Family Medicine

## 2022-11-30 DIAGNOSIS — Z87891 Personal history of nicotine dependence: Secondary | ICD-10-CM | POA: Diagnosis not present

## 2022-11-30 DIAGNOSIS — J432 Centrilobular emphysema: Secondary | ICD-10-CM | POA: Diagnosis not present

## 2022-11-30 DIAGNOSIS — I2510 Atherosclerotic heart disease of native coronary artery without angina pectoris: Secondary | ICD-10-CM | POA: Insufficient documentation

## 2022-11-30 DIAGNOSIS — R918 Other nonspecific abnormal finding of lung field: Secondary | ICD-10-CM | POA: Diagnosis not present

## 2022-11-30 DIAGNOSIS — J439 Emphysema, unspecified: Secondary | ICD-10-CM | POA: Insufficient documentation

## 2022-11-30 DIAGNOSIS — I700 Atherosclerosis of aorta: Secondary | ICD-10-CM | POA: Diagnosis not present

## 2022-11-30 DIAGNOSIS — Z122 Encounter for screening for malignant neoplasm of respiratory organs: Secondary | ICD-10-CM | POA: Diagnosis not present

## 2022-11-30 DIAGNOSIS — F17210 Nicotine dependence, cigarettes, uncomplicated: Secondary | ICD-10-CM | POA: Diagnosis not present

## 2022-11-30 DIAGNOSIS — R911 Solitary pulmonary nodule: Secondary | ICD-10-CM | POA: Insufficient documentation

## 2023-01-30 ENCOUNTER — Ambulatory Visit (INDEPENDENT_AMBULATORY_CARE_PROVIDER_SITE_OTHER): Payer: Medicare Other | Admitting: Family Medicine

## 2023-01-30 ENCOUNTER — Encounter: Payer: Self-pay | Admitting: Family Medicine

## 2023-01-30 VITALS — BP 122/72 | HR 95 | Ht 63.0 in | Wt 118.0 lb

## 2023-01-30 DIAGNOSIS — R5383 Other fatigue: Secondary | ICD-10-CM

## 2023-01-30 DIAGNOSIS — F419 Anxiety disorder, unspecified: Secondary | ICD-10-CM

## 2023-01-30 DIAGNOSIS — F32A Depression, unspecified: Secondary | ICD-10-CM

## 2023-01-30 MED ORDER — SERTRALINE HCL 25 MG PO TABS: 25.0000 mg | ORAL_TABLET | Freq: Every day | ORAL | 3 refills | Status: DC

## 2023-01-30 NOTE — Progress Notes (Addendum)
Date:  01/30/2023   Name:  Angelica Walker   DOB:  [birth year]   MRN:  [id number]   Chief Complaint: Anxiety and Depression  Depression        This is a new problem.  The current episode started more than 1 month ago.   The onset quality is gradual.   The problem occurs intermittently.  The problem has been waxing and waning since onset.  Associated symptoms include fatigue, helplessness, hopelessness, insomnia, restlessness, decreased interest and sad.  Associated symptoms include no decreased concentration, not irritable and no suicidal ideas.  Past treatments include nothing.  Past medical history includes thyroid problem and anxiety.   Anxiety Presents for follow-up visit. Symptoms include depressed mood, excessive worry, insomnia, irritability, nervous/anxious behavior and restlessness. Patient reports no chest pain, compulsions, confusion, decreased concentration, dizziness, dry mouth, feeling of choking, hyperventilation, impotence, malaise, muscle tension, nausea, obsessions, palpitations, shortness of breath or suicidal ideas. Symptoms occur occasionally.    Thyroid Problem Presents for initial visit. Symptoms include anxiety, depressed mood and fatigue. Patient reports no cold intolerance, diaphoresis, dry skin, hair loss, nail problem, palpitations or weight gain. The symptoms have been stable.    Lab Results  Component Value Date   NA 138 09/28/2022   K 4.3 09/28/2022   CO2 22 09/28/2022   GLUCOSE 118 (H) 09/28/2022   BUN 18 09/28/2022   CREATININE 0.85 09/28/2022   CALCIUM 10.2 09/28/2022   EGFR 70 09/28/2022   GFRNONAA 85 10/07/2020   Lab Results  Component Value Date   CHOL 212 (H) 03/28/2022   HDL 49 03/28/2022   LDLCALC 136 (H) 03/28/2022   TRIG 152 (H) 03/28/2022   CHOLHDL 3.5 12/19/2017   Lab Results  Component Value Date   TSH 1.900 12/19/2017   Lab Results  Component Value Date   HGBA1C 5.9 (H) 09/28/2022   Lab Results  Component Value Date   WBC  8.2 11/28/2022   HGB 13.3 11/28/2022   HCT 39.7 11/28/2022   MCV 95.4 11/28/2022   PLT 253 11/28/2022   Lab Results  Component Value Date   ALT 8 12/19/2017   AST 18 12/19/2017   ALKPHOS 87 12/19/2017   BILITOT 0.4 12/19/2017   No results found for: "25OHVITD2", "25OHVITD3", "VD25OH"   Review of Systems  Constitutional:  Positive for fatigue and irritability. Negative for diaphoresis and weight gain.  Respiratory:  Negative for cough, chest tightness, shortness of breath, wheezing and stridor.   Cardiovascular:  Negative for chest pain, palpitations and leg swelling.  Gastrointestinal:  Negative for blood in stool and nausea.  Endocrine: Negative for cold intolerance, polydipsia and polyuria.  Genitourinary:  Negative for impotence.  Neurological:  Negative for dizziness.  Psychiatric/Behavioral:  Positive for depression. Negative for confusion, decreased concentration and suicidal ideas. The patient is nervous/anxious and has insomnia.     Patient Active Problem List   Diagnosis Date Noted   Iron deficiency anemia due to chronic blood loss 07/02/2018   Smoker 03/18/2018   RLS (restless legs syndrome) 01/18/2018   Anemia due to blood loss 12/20/2017   Prediabetes 12/19/2017   Hyperlipidemia, unspecified 12/19/2017   Hypertension, essential, benign 12/19/2017   B12 deficiency 12/19/2017   DDD (degenerative disc disease), lumbar 12/19/2017   Multiple gastric ulcers 12/19/2017   COPD (chronic obstructive pulmonary disease) ([place]) 12/21/2014    Allergies  Allergen Reactions   Simvastatin Other (See Comments)    Past Surgical History:  Procedure Laterality Date  CATARACT EXTRACTION W/PHACO Left 06/29/2020   Procedure: CATARACT EXTRACTION PHACO AND INTRAOCULAR LENS PLACEMENT (IOC) LEFT 7.20 00:42.7;  Surgeon: Eulogio Bear, MD;  Location: [place];  Service: Ophthalmology;  Laterality: Left;   CATARACT EXTRACTION W/PHACO Right 07/26/2020   Procedure: CATARACT  EXTRACTION PHACO AND INTRAOCULAR LENS PLACEMENT (IOC) RIGHT 7.97  00:53.0;  Surgeon: Eulogio Bear, MD;  Location: [place];  Service: Ophthalmology;  Laterality: Right;   COLONOSCOPY WITH PROPOFOL N/A 12/07/2017   Procedure: COLONOSCOPY WITH PROPOFOL;  Surgeon: Lollie Sails, MD;  Location[facility] ENDOSCOPY;  Service: Endoscopy;  Laterality: N/A;   ESOPHAGOGASTRODUODENOSCOPY (EGD) WITH PROPOFOL N/A 12/07/2017   Procedure: ESOPHAGOGASTRODUODENOSCOPY (EGD) WITH PROPOFOL;  Surgeon: Lollie Sails, MD;  Location: [facility] ENDOSCOPY;  Service: Endoscopy;  Laterality: N/A;   ESOPHAGOGASTRODUODENOSCOPY (EGD) WITH PROPOFOL N/A 03/21/2018   Procedure: ESOPHAGOGASTRODUODENOSCOPY (EGD) WITH PROPOFOL;  Surgeon: Lollie Sails, MD;  Location: [facility] ENDOSCOPY;  Service: Endoscopy;  Laterality: N/A;   ESOPHAGOGASTRODUODENOSCOPY (EGD) WITH PROPOFOL N/A 07/22/2018   Procedure: ESOPHAGOGASTRODUODENOSCOPY (EGD) WITH PROPOFOL;  Surgeon: Lollie Sails, MD;  Location: [facility] ENDOSCOPY;  Service: Endoscopy;  Laterality: N/A;    Social History   Tobacco Use   Smoking status: Every Day    Packs/day: 1.00    Years: 54.00    Total pack years: 54.00    Types: Cigarettes   Smokeless tobacco: Never   Tobacco comments:    patches and pills discussed   Vaping Use   Vaping Use: Never used  Substance Use Topics   Alcohol use: No   Drug use: No     Medication list has been reviewed and updated.  Current Meds  Medication Sig   acetaminophen (TYLENOL) 500 MG tablet Take 1,000 mg by mouth 2 (two) times daily as needed.   Ascorbic Acid (VITAMIN C) 100 MG tablet Take 100 mg by mouth daily.   Calcium Carbonate-Vitamin D 600-200 MG-UNIT TABS Take by mouth.   Cyanocobalamin (B-12) 1000 MCG TABS Take 1 tablet by mouth daily.   gabapentin (NEURONTIN) 100 MG capsule TAKE 1 CAPSULE BY MOUTH AT BEDTIME.   GLUCOSAMINE-CHONDROITIN PO Take 3 tablets by mouth.   lisinopril-hydrochlorothiazide (ZESTORETIC)  20-12.5 MG tablet TAKE ONE (1) TABLET BY MOUTH ONCE DAILY   Melatonin 10 MG TABS Take 1 tablet by mouth at bedtime.   MELATONIN PO Take by mouth at bedtime as needed.   pantoprazole (PROTONIX) 40 MG tablet TAKE (1) TABLET BY MOUTH EVERY DAY       01/30/2023    3:54 PM 09/28/2022    1:52 PM 03/28/2022    1:20 PM 04/07/2021   11:33 AM  GAD 7 : Generalized Anxiety Score  Nervous, Anxious, on Edge 3 0 0 0  Control/stop worrying 3 0 0 0  Worry too much - different things 3 0 0 0  Trouble relaxing 3 0 0 0  Restless 3 0 0 0  Easily annoyed or irritable 0 0 0 0  Afraid - awful might happen 0 0 0 0  Total GAD 7 Score 15 0 0 0  Anxiety Difficulty Somewhat difficult Not difficult at all Not difficult at all        01/30/2023    3:53 PM 09/28/2022    1:52 PM 08/30/2022    3:39 PM  Depression screen PHQ 2/9  Decreased Interest 3 0 0  Down, Depressed, Hopeless 3 0 0  PHQ - 2 Score 6 0 0  Altered sleeping 3 0 0  Tired, decreased energy 3 0 0  Change in appetite 1 0 0  Feeling bad or failure about yourself  0 0 0  Trouble concentrating 3 0 3  Moving slowly or fidgety/restless 2 0 0  Suicidal thoughts 0 0 0  PHQ-9 Score 18 0 3  Difficult doing work/chores Very difficult Not difficult at all Not difficult at all    BP Readings from Last 3 Encounters:  01/30/23 (!) 136/90  11/28/22 (!) 172/93  11/21/22 132/76    Physical Exam Vitals and nursing note reviewed. Exam conducted with a chaperone present.  Constitutional:      General: She is not irritable.She is not in acute distress.    Appearance: She is not diaphoretic.  HENT:     Head: Normocephalic and atraumatic.     Right Ear: External ear normal.     Left Ear: External ear normal.     Nose: Nose normal.     Mouth/Throat:     Mouth: Mucous membranes are moist.  Eyes:     General:        Right eye: No discharge.        Left eye: No discharge.     Conjunctiva/sclera: Conjunctivae normal.     Pupils: Pupils are equal, round,  and reactive to light.  Neck:     Thyroid: No thyromegaly.     Vascular: No JVD.  Cardiovascular:     Rate and Rhythm: Normal rate and regular rhythm.     Heart sounds: Normal heart sounds. No murmur heard.    No friction rub. No gallop.  Pulmonary:     Effort: Pulmonary effort is normal.     Breath sounds: Normal breath sounds. No wheezing, rhonchi or rales.  Abdominal:     General: Bowel sounds are normal.     Palpations: Abdomen is soft. There is no mass.     Tenderness: There is no abdominal tenderness. There is no guarding.  Musculoskeletal:        General: Normal range of motion.     Cervical back: Normal range of motion and neck supple.  Lymphadenopathy:     Cervical: No cervical adenopathy.  Skin:    General: Skin is warm and dry.  Neurological:     Mental Status: She is alert.     Wt Readings from Last 3 Encounters:  01/30/23 118 lb (53.5 kg)  11/28/22 119 lb (54 kg)  11/21/22 120 lb 6.4 oz (54.6 kg)    BP (!) 136/90   Pulse 95   Ht 5' 3"$  (1.6 m)   Wt 118 lb (53.5 kg)   SpO2 98%   BMI 20.90 kg/m   Assessment and Plan:  1. Fatigue, unspecified type Chronic.  Persistent.  Stable.  Patient has a history of anemia and is followed by oncology for infusions.  Patient does also have some fatigue and with some symptomatology that may suggest thyroid and we will check a thyroid panel with TSH and if necessary proceed with supplementation. - Thyroid Panel With TSH  2. Anxiety and depression New onset.  Persistent.  PHQ is 18 GAD score is 15.  This is new in regard to previous visits.  However patient is not suicidal and has some insomnia which should improve with improvement of anxiety depression.  We will initiate sertraline 25 mg once a day and will recheck in 6 weeks. - sertraline (ZOLOFT) 25 MG tablet; Take 1 tablet (25 mg total) by mouth daily.  Dispense: 30  tablet; Refill: 3    Otilio Miu, MD

## 2023-01-31 LAB — THYROID PANEL WITH TSH
Free Thyroxine Index: 2.2 (ref 1.2–4.9)
T3 Uptake Ratio: 28 % (ref 24–39)
T4, Total: 7.7 ug/dL (ref 4.5–12.0)
TSH: 1.32 u[IU]/mL (ref 0.450–4.500)

## 2023-02-07 ENCOUNTER — Telehealth: Payer: Self-pay

## 2023-02-07 NOTE — Telephone Encounter (Signed)
Pt given lab results per notes of Dr. Ronnald Ramp on 02/07/23. Pt verbalized understanding.

## 2023-03-21 ENCOUNTER — Encounter: Payer: Self-pay | Admitting: Family Medicine

## 2023-03-21 ENCOUNTER — Ambulatory Visit (INDEPENDENT_AMBULATORY_CARE_PROVIDER_SITE_OTHER): Payer: Medicare Other | Admitting: Family Medicine

## 2023-03-21 VITALS — BP 130/78 | HR 70 | Ht 63.0 in | Wt 116.0 lb

## 2023-03-21 DIAGNOSIS — F32A Depression, unspecified: Secondary | ICD-10-CM

## 2023-03-21 DIAGNOSIS — I10 Essential (primary) hypertension: Secondary | ICD-10-CM | POA: Diagnosis not present

## 2023-03-21 DIAGNOSIS — F419 Anxiety disorder, unspecified: Secondary | ICD-10-CM

## 2023-03-21 DIAGNOSIS — F17200 Nicotine dependence, unspecified, uncomplicated: Secondary | ICD-10-CM

## 2023-03-21 DIAGNOSIS — G2581 Restless legs syndrome: Secondary | ICD-10-CM | POA: Diagnosis not present

## 2023-03-21 DIAGNOSIS — K259 Gastric ulcer, unspecified as acute or chronic, without hemorrhage or perforation: Secondary | ICD-10-CM

## 2023-03-21 DIAGNOSIS — E785 Hyperlipidemia, unspecified: Secondary | ICD-10-CM | POA: Diagnosis not present

## 2023-03-21 DIAGNOSIS — R7303 Prediabetes: Secondary | ICD-10-CM

## 2023-03-21 MED ORDER — LISINOPRIL-HYDROCHLOROTHIAZIDE 20-12.5 MG PO TABS: ORAL_TABLET | ORAL | 1 refills | Status: DC

## 2023-03-21 MED ORDER — SERTRALINE HCL 50 MG PO TABS: 50.0000 mg | ORAL_TABLET | Freq: Every day | ORAL | 3 refills | Status: DC

## 2023-03-21 MED ORDER — GABAPENTIN 100 MG PO CAPS: ORAL_CAPSULE | ORAL | 1 refills | Status: DC

## 2023-03-21 MED ORDER — PANTOPRAZOLE SODIUM 40 MG PO TBEC: DELAYED_RELEASE_TABLET | ORAL | 1 refills | Status: DC

## 2023-03-21 NOTE — Progress Notes (Signed)
Date:  03/21/2023   Name:  Angelica Walker   DOB:  25-Jul-1941   MRN:  [id number]   Chief Complaint: Depression (18 to 7 and 15 to 3 on depression- wants to increase to 50mg ), Gastroesophageal Reflux, Hypertension, restless leg, and Prediabetes  Depression      (restless leg)  This is a chronic problem.  The current episode started more than 1 year ago.   The onset quality is gradual.   The problem has been gradually improving since onset.  Associated symptoms include hopelessness, insomnia, restlessness, appetite change and sad.  Associated symptoms include no fatigue and no headaches.  Past treatments include SSRIs - Selective serotonin reuptake inhibitors.  Compliance with treatment is good.  Past medical history includes anxiety.   Gastroesophageal Reflux She reports no abdominal pain, no chest pain, no coughing, no dysphagia, no heartburn, no nausea or no wheezing. restless leg. The symptoms are aggravated by certain foods. Pertinent negatives include no fatigue, melena or orthopnea. She has tried a PPI for the symptoms.  Hypertension This is a chronic problem. The problem has been gradually improving since onset. The problem is controlled. Associated symptoms include anxiety and palpitations. Pertinent negatives include no blurred vision, chest pain, headaches, peripheral edema or shortness of breath. Past treatments include ACE inhibitors and diuretics. The current treatment provides moderate improvement. There are no compliance problems.   Anxiety Presents for follow-up visit. Symptoms include excessive worry, insomnia, nervous/anxious behavior, palpitations and restlessness. Patient reports no chest pain, dizziness, nausea or shortness of breath. Primary symptoms comment: restless leg. Symptoms occur occasionally.    Neurologic Problem The patient's pertinent negatives include no weakness. Primary symptoms comment: restless leg. This is a chronic problem. The neurological problem developed  gradually. The problem has been gradually improving since onset. Associated symptoms include palpitations. Pertinent negatives include no abdominal pain, chest pain, dizziness, fatigue, fever, headaches, nausea or shortness of breath.  Diabetes She presents for her follow-up diabetic visit. Diabetes type: prediabetes. Her disease course has been stable. Hypoglycemia symptoms include nervousness/anxiousness. Pertinent negatives for hypoglycemia include no dizziness or headaches. Pertinent negatives for diabetes include no blurred vision, no chest pain, no fatigue, no polydipsia, no polyphagia, no polyuria and no weakness.    Lab Results  Component Value Date   NA 138 09/28/2022   K 4.3 09/28/2022   CO2 22 09/28/2022   GLUCOSE 118 (H) 09/28/2022   BUN 18 09/28/2022   CREATININE 0.85 09/28/2022   CALCIUM 10.2 09/28/2022   EGFR 70 09/28/2022   GFRNONAA 85 10/07/2020   Lab Results  Component Value Date   CHOL 212 (H) 03/28/2022   HDL 49 03/28/2022   LDLCALC 136 (H) 03/28/2022   TRIG 152 (H) 03/28/2022   CHOLHDL 3.5 12/19/2017   Lab Results  Component Value Date   TSH 1.320 01/30/2023   Lab Results  Component Value Date   HGBA1C 5.9 (H) 09/28/2022   Lab Results  Component Value Date   WBC 8.2 11/28/2022   HGB 13.3 11/28/2022   HCT 39.7 11/28/2022   MCV 95.4 11/28/2022   PLT 253 11/28/2022   Lab Results  Component Value Date   ALT 8 12/19/2017   AST 18 12/19/2017   ALKPHOS 87 12/19/2017   BILITOT 0.4 12/19/2017   No results found for: "25OHVITD2", "25OHVITD3", "VD25OH"   Review of Systems  Constitutional:  Positive for appetite change. Negative for fatigue and fever.  HENT:  Negative for congestion.   Eyes:  Negative  for blurred vision and visual disturbance.  Respiratory:  Negative for cough, chest tightness, shortness of breath, wheezing and stridor.   Cardiovascular:  Positive for palpitations. Negative for chest pain.  Gastrointestinal:  Negative for abdominal  pain, blood in stool, dysphagia, heartburn, melena and nausea.  Endocrine: Negative for polydipsia, polyphagia and polyuria.  Genitourinary:  Negative for difficulty urinating and vaginal bleeding.  Neurological:  Negative for dizziness, weakness and headaches.  Hematological:  Negative for adenopathy.  Psychiatric/Behavioral:  Positive for depression. The patient is nervous/anxious and has insomnia.     Patient Active Problem List   Diagnosis Date Noted   Iron deficiency anemia due to chronic blood loss 07/02/2018   Smoker 03/18/2018   RLS (restless legs syndrome) 01/18/2018   Anemia due to blood loss 12/20/2017   Prediabetes 12/19/2017   Hyperlipidemia, unspecified 12/19/2017   Hypertension, essential, benign 12/19/2017   B12 deficiency 12/19/2017   DDD (degenerative disc disease), lumbar 12/19/2017   Multiple gastric ulcers 12/19/2017   COPD (chronic obstructive pulmonary disease) 12/21/2014    Allergies  Allergen Reactions   Simvastatin Other (See Comments)    Past Surgical History:  Procedure Laterality Date   CATARACT EXTRACTION W/PHACO Left 06/29/2020   Procedure: CATARACT EXTRACTION PHACO AND INTRAOCULAR LENS PLACEMENT (IOC) LEFT 7.20 00:42.7;  Surgeon: Eulogio Bear, MD;  Location: [place];  Service: Ophthalmology;  Laterality: Left;   CATARACT EXTRACTION W/PHACO Right 07/26/2020   Procedure: CATARACT EXTRACTION PHACO AND INTRAOCULAR LENS PLACEMENT (IOC) RIGHT 7.97  00:53.0;  Surgeon: Eulogio Bear, MD;  Location: [place];  Service: Ophthalmology;  Laterality: Right;   COLONOSCOPY WITH PROPOFOL N/A 12/07/2017   Procedure: COLONOSCOPY WITH PROPOFOL;  Surgeon: Lollie Sails, MD;  Location[facility] ENDOSCOPY;  Service: Endoscopy;  Laterality: N/A;   ESOPHAGOGASTRODUODENOSCOPY (EGD) WITH PROPOFOL N/A 12/07/2017   Procedure: ESOPHAGOGASTRODUODENOSCOPY (EGD) WITH PROPOFOL;  Surgeon: Lollie Sails, MD;  Location: [facility] ENDOSCOPY;  Service:  Endoscopy;  Laterality: N/A;   ESOPHAGOGASTRODUODENOSCOPY (EGD) WITH PROPOFOL N/A 03/21/2018   Procedure: ESOPHAGOGASTRODUODENOSCOPY (EGD) WITH PROPOFOL;  Surgeon: Lollie Sails, MD;  Location: [facility] ENDOSCOPY;  Service: Endoscopy;  Laterality: N/A;   ESOPHAGOGASTRODUODENOSCOPY (EGD) WITH PROPOFOL N/A 07/22/2018   Procedure: ESOPHAGOGASTRODUODENOSCOPY (EGD) WITH PROPOFOL;  Surgeon: Lollie Sails, MD;  Location: [facility] ENDOSCOPY;  Service: Endoscopy;  Laterality: N/A;    Social History   Tobacco Use   Smoking status: Every Day    Packs/day: 1.00    Years: 54.00    Additional pack years: 0.00    Total pack years: 54.00    Types: Cigarettes   Smokeless tobacco: Never   Tobacco comments:    patches and pills discussed   Vaping Use   Vaping Use: Never used  Substance Use Topics   Alcohol use: No   Drug use: No     Medication list has been reviewed and updated.  Current Meds  Medication Sig   acetaminophen (TYLENOL) 500 MG tablet Take 1,000 mg by mouth 2 (two) times daily as needed.   Ascorbic Acid (VITAMIN C) 100 MG tablet Take 100 mg by mouth daily.   Calcium Carbonate-Vitamin D 600-200 MG-UNIT TABS Take by mouth.   Cyanocobalamin (B-12) 1000 MCG TABS Take 1 tablet by mouth daily.   gabapentin (NEURONTIN) 100 MG capsule TAKE 1 CAPSULE BY MOUTH AT BEDTIME.   GLUCOSAMINE-CHONDROITIN PO Take 3 tablets by mouth.   lisinopril-hydrochlorothiazide (ZESTORETIC) 20-12.5 MG tablet TAKE ONE (1) TABLET BY MOUTH ONCE DAILY   Melatonin  10 MG TABS Take 1 tablet by mouth at bedtime.   MELATONIN PO Take by mouth at bedtime as needed.   pantoprazole (PROTONIX) 40 MG tablet TAKE (1) TABLET BY MOUTH EVERY DAY   sertraline (ZOLOFT) 25 MG tablet Take 1 tablet (25 mg total) by mouth daily.       03/21/2023   11:05 AM 01/30/2023    3:54 PM 09/28/2022    1:52 PM 03/28/2022    1:20 PM  GAD 7 : Generalized Anxiety Score  Nervous, Anxious, on Edge 1 3 0 0  Control/stop worrying 1 3 0 0  Worry too  much - different things 1 3 0 0  Trouble relaxing 0 3 0 0  Restless 0 3 0 0  Easily annoyed or irritable 0 0 0 0  Afraid - awful might happen 0 0 0 0  Total GAD 7 Score 3 15 0 0  Anxiety Difficulty Not difficult at all Somewhat difficult Not difficult at all Not difficult at all       03/21/2023   11:03 AM 01/30/2023    3:53 PM 09/28/2022    1:52 PM  Depression screen PHQ 2/9  Decreased Interest 0 3 0  Down, Depressed, Hopeless 1 3 0  PHQ - 2 Score 1 6 0  Altered sleeping 1 3 0  Tired, decreased energy 0 3 0  Change in appetite 2 1 0  Feeling bad or failure about yourself  1 0 0  Trouble concentrating 0 3 0  Moving slowly or fidgety/restless 2 2 0  Suicidal thoughts 0 0 0  PHQ-9 Score 7 18 0  Difficult doing work/chores Somewhat difficult Very difficult Not difficult at all    BP Readings from Last 3 Encounters:  03/21/23 130/78  01/30/23 122/72  11/28/22 (!) 172/93    Physical Exam Vitals and nursing note reviewed. Exam conducted with a chaperone present.  Constitutional:      General: She is not in acute distress.    Appearance: She is not diaphoretic.  HENT:     Head: Normocephalic and atraumatic.     Right Ear: External ear normal.     Left Ear: External ear normal.     Nose: Nose normal.  Eyes:     General:        Right eye: No discharge.        Left eye: No discharge.     Conjunctiva/sclera: Conjunctivae normal.     Pupils: Pupils are equal, round, and reactive to light.  Neck:     Thyroid: No thyromegaly.     Vascular: No JVD.  Cardiovascular:     Rate and Rhythm: Normal rate and regular rhythm.     Heart sounds: Normal heart sounds. No murmur heard.    No friction rub. No gallop.  Pulmonary:     Effort: Pulmonary effort is normal.     Breath sounds: Normal breath sounds. No wheezing or rales.  Abdominal:     General: Bowel sounds are normal.     Palpations: Abdomen is soft. There is no mass.     Tenderness: There is no abdominal tenderness. There  is no guarding.  Musculoskeletal:        General: Normal range of motion.     Cervical back: Normal range of motion and neck supple.  Lymphadenopathy:     Cervical: No cervical adenopathy.  Skin:    General: Skin is warm and dry.  Neurological:     Mental Status:  She is alert.     Deep Tendon Reflexes: Reflexes are normal and symmetric.     Wt Readings from Last 3 Encounters:  03/21/23 116 lb (52.6 kg)  01/30/23 118 lb (53.5 kg)  11/28/22 119 lb (54 kg)    BP 130/78   Pulse 70   Ht 5\' 3"  (1.6 m)   Wt 116 lb (52.6 kg)   SpO2 96%   BMI 20.55 kg/m   Assessment and Plan: 1. Hypertension, essential, benign Chronic.  Controlled.  Stable.  Blood pressure today 130/78.  Asymptomatic.  Tolerating medications well will continue lisinopril hydrochlorothiazide 20/12.5 mg once a day and will recheck in 6 months. - lisinopril-hydrochlorothiazide (ZESTORETIC) 20-12.5 MG tablet; TAKE ONE (1) TABLET BY MOUTH ONCE DAILY  Dispense: 90 tablet; Refill: 1  2. Prediabetes Chronic.  Stable..  Controlled.  Diet controlled by limiting concentrated sweets and carbohydrates.  Will recheck A1c for current level of control. - HgB A1c - Lipid Panel With LDL/HDL Ratio  3. Multiple gastric ulcers Chronic.  Controlled.  Stable.  Patient is doing well and is asymptomatic on pantoprazole 40 mg once a day. - pantoprazole (PROTONIX) 40 MG tablet; TAKE (1) TABLET BY MOUTH EVERY DAY  Dispense: 90 tablet; Refill: 1  4. Anxiety and depression Chronic.  Uncontrolled.  Lithium proved.  Relatively stable.  PHQ went from 18-7 on 25 mg Zoloft.  GAD score went from 15-3 on above dosing.  Not quite to control level will increase to 50 mg Zoloft and recheck in 6 months. - sertraline (ZOLOFT) 50 MG tablet; Take 1 tablet (50 mg total) by mouth daily.  Dispense: 30 tablet; Refill: 3  5. RLS (restless legs syndrome) Chronic.  Controlled.  Stable.  Doing well on gabapentin 100 mg nightly. - gabapentin (NEURONTIN) 100 MG  capsule; TAKE 1 CAPSULE BY MOUTH AT BEDTIME.  Dispense: 90 capsule; Refill: 1  6. Hyperlipidemia, unspecified hyperlipidemia type Chronic.  Diet controlled.  Stable. - Lipid Panel With LDL/HDL Ratio  7. Smoker Patient has been advised of the health risks of smoking and counseled concerning cessation of tobacco products. I spent over 3 minutes for discussion and to answer questions.      Otilio Miu, MD

## 2023-03-22 ENCOUNTER — Other Ambulatory Visit: Payer: Self-pay

## 2023-03-22 LAB — LIPID PANEL WITH LDL/HDL RATIO
Cholesterol, Total: 217 mg/dL — ABNORMAL HIGH (ref 100–199)
HDL: 53 mg/dL (ref >39)
LDL Chol Calc (NIH): 139 mg/dL — ABNORMAL HIGH (ref 0–99)
LDL/HDL Ratio: 2.6 ratio (ref 0.0–3.2)
Triglycerides: 140 mg/dL (ref 0–149)
VLDL Cholesterol Cal: 25 mg/dL (ref 5–40)

## 2023-03-22 LAB — HEMOGLOBIN A1C
Est. average glucose Bld gHb Est-mCnc: 123 mg/dL
Hgb A1c MFr Bld: 5.9 % — ABNORMAL HIGH (ref 4.8–5.6)

## 2023-03-22 MED ORDER — ATORVASTATIN CALCIUM 10 MG PO TABS: 10.0000 mg | ORAL_TABLET | Freq: Every day | ORAL | 3 refills | Status: DC

## 2023-05-30 ENCOUNTER — Inpatient Hospital Stay: Payer: Medicare Other | Attending: Oncology

## 2023-05-30 DIAGNOSIS — D509 Iron deficiency anemia, unspecified: Secondary | ICD-10-CM | POA: Diagnosis not present

## 2023-05-30 LAB — CBC
HCT: 35.9 % — ABNORMAL LOW (ref 36.0–46.0)
Hemoglobin: 12.1 g/dL (ref 12.0–15.0)
MCH: 30.3 pg (ref 26.0–34.0)
MCHC: 33.7 g/dL (ref 30.0–36.0)
MCV: 90 fL (ref 80.0–100.0)
Platelets: 299 10*3/uL (ref 150–400)
RBC: 3.99 MIL/uL (ref 3.87–5.11)
RDW: 14.1 % (ref 11.5–15.5)
WBC: 9.5 10*3/uL (ref 4.0–10.5)
nRBC: 0 % (ref 0.0–0.2)

## 2023-05-30 LAB — IRON AND TIBC
Iron: 44 ug/dL (ref 28–170)
Saturation Ratios: 11 % (ref 10.4–31.8)
TIBC: 410 ug/dL (ref 250–450)
UIBC: 366 ug/dL

## 2023-05-30 LAB — FERRITIN: Ferritin: 35 ng/mL (ref 11–307)

## 2023-06-08 ENCOUNTER — Telehealth: Payer: Self-pay | Admitting: *Deleted

## 2023-06-08 NOTE — Telephone Encounter (Signed)
Patient called asking someone to call her with/ about her results from last week  Ferritin Order: [id number] Status: Final result     Visible to patient: No (inaccessible in MyChart)     Next appt: 07/31/2023 at 01:20 PM in Internal Medicine Elizabeth Sauer, MD)     Dx: Iron deficiency anemia, unspecified i...   0 Result Notes          Component Ref Range & Units 9 d ago (05/30/23) 6 mo ago (11/28/22) 1 yr ago (06/06/22) 1 yr ago (10/11/21) 2 yr ago (04/11/21) 2 yr ago (01/10/21) 2 yr ago (10/11/20)  Ferritin 11 - 307 ng/mL 35 122 CM 16 CM 40 CM 28 CM 52 CM 22 CM  Comment: Performed at [facility], [address] [place], [place] [address]  Resulting Agency [facility]         Specimen Collected: 05/30/23 14:30 Last Resulted: 05/30/23 16:48      Lab Flowsheet      Order Details      View Encounter      Lab and Collection Details      Routing      Result History    View All Conversations on this Encounter      CM=Additional comments      Result Care Coordination   Patient Communication   Add Comments   Not seen Back to Top      Other Results from 05/30/2023  Iron and TIBC Order: [id number] Status: Final result      Visible to patient: No (inaccessible in MyChart)      Next appt: 07/31/2023 at 01:20 PM in Internal Medicine Elizabeth Sauer, MD)      Dx: Iron deficiency anemia, unspecified i...    0 Result Notes           Component Ref Range & Units 9 d ago 6 mo ago 1 yr ago 3 yr ago 4 yr ago 5 yr ago  Iron 28 - 170 ug/dL 44 132 52 49 15 Low  13 Low   TIBC 250 - 450 ug/dL 440 102 725 366 440 347 High   Saturation Ratios 10.4 - 31.8 % 11 31 12 12 4  Low  3 Low   UIBC ug/dL 425 956 CM 387 CM 564 CM 414 CM 506 CM  Comment: Performed at [facility], [address] [place], [place] [address]  Resulting Agency [facility]         Specimen Collected: 05/30/23 14:30 Last Resulted: 05/30/23 16:48      Lab Flowsheet       Order Details       View Encounter       Lab and Collection Details       Routing       Result History     View All Conversations on this Encounter      CM=Additional comments      Result Care Coordination   Patient Communication   Add Comments   Not seen Back to Top         Contains abnormal data CBC Order: [id number] Status: Final result      Visible to patient: No (inaccessible in MyChart)      Next appt: 07/31/2023 at 01:20 PM in Internal  Medicine Elizabeth Sauer, MD)      Dx: Iron deficiency anemia, unspecified i...    0 Result Notes            Component Ref Range & Units 9 d ago (05/30/23) 6 mo ago (11/28/22) 1 yr ago (06/06/22) 1 yr ago (10/11/21) 2 yr ago (04/11/21) 2 yr ago (01/10/21) 2 yr ago (10/11/20)  WBC 4.0 - 10.5 K/uL 9.5 8.2 10.1 9.3 6.3 7.3 7.6  RBC 3.87 - 5.11 MIL/uL 3.99 4.16 4.19 4.05 4.14 4.14 4.24  Hemoglobin 12.0 - 15.0 g/dL 52.8 41.3 24.4 Low  01.0 12.6 12.7 12.8  HCT 36.0 - 46.0 % 35.9 Low  39.7 36.3 37.2 37.6 38.0 38.6  MCV 80.0 - 100.0 fL 90.0 95.4 86.6 91.9 90.8 91.8 91.0  MCH 26.0 - 34.0 pg 30.3 32.0 27.9 30.6 30.4 30.7 30.2  MCHC 30.0 - 36.0 g/dL 27.2 53.6 64.4 03.4 74.2 33.4 33.2  RDW 11.5 - 15.5 % 14.1 12.2 14.5 13.2 13.7 14.4 14.2  Platelets 150 - 400 K/uL 299 253 321 316 301 310 302  nRBC 0.0 - 0.2 % 0.0 0.0 CM 0.0 0.0 0.0 0.0 0.0  Comment: Performed at [facility], [address] [place], [place] [address]  Neutrophils Relative %   66 R 61 R 44 R 53 R 62 R  Basophils Absolute   0.1 R 0.1 R 0.1 R 0.1 R 0.1 R  Immature Granulocytes   0 R 0 R 0 R 0 R 0 R  Abs Immature Granulocytes   0.03 R, CM 0.02 R, CM 0.02 R, CM 0.03 R, CM 0.03 R, CM  Neutro Abs   6.8 R 5.7 R 2.8 R 3.8 R 4.8 R  Lymphocytes Relative   24 R 28 R 39 R 34 R 26 R  Lymphs Abs   2.4 R 2.6 R 2.4 R 2.5 R 2.0 R   Monocytes Relative   8 R 8 R 12 R 10 R 9 R  Monocytes Absolute   0.8 R 0.7 R 0.7 R 0.8 R 0.7 R  Eosinophils Relative   1 R 2 R 3 R 2 R 2 R  Eosinophils Absolute   0.1 R 0.2 R 0.2 R 0.2 R 0.2 R  Basophils Relative   1 R 1 R 2 R 1 R 1 R  Resulting Agency [facility]         Specimen Collected: 05/30/23 14:30 Last Resulted: 05/30/23 14:40

## 2023-06-11 ENCOUNTER — Other Ambulatory Visit: Payer: Self-pay

## 2023-06-11 ENCOUNTER — Encounter: Payer: Self-pay | Admitting: Internal Medicine

## 2023-06-11 DIAGNOSIS — D509 Iron deficiency anemia, unspecified: Secondary | ICD-10-CM

## 2023-06-11 NOTE — Telephone Encounter (Signed)
Per Angelica Walker, schedule patient for venofer sometime this week or next. Then F/u with labs/rao/+/-venofer in 3 weeks.

## 2023-06-11 NOTE — Telephone Encounter (Signed)
Called to inform patient of lab results per Consuello Masse. There was no answer, unable to leave message due to no answering service. Will try again

## 2023-06-11 NOTE — Telephone Encounter (Addendum)
Patient would like to get Iron infusion as she is having symptoms of fatigue "and all that" and she would like an afternoon appointment

## 2023-06-12 ENCOUNTER — Other Ambulatory Visit: Payer: Self-pay | Admitting: Nurse Practitioner

## 2023-06-12 NOTE — Progress Notes (Signed)
Feraheme orders updated for planned treatment on Friday.

## 2023-06-15 ENCOUNTER — Inpatient Hospital Stay: Payer: Medicare Other

## 2023-06-15 VITALS — BP 127/82 | HR 84 | Temp 99.2°F | Resp 16

## 2023-06-15 DIAGNOSIS — D500 Iron deficiency anemia secondary to blood loss (chronic): Secondary | ICD-10-CM

## 2023-06-15 DIAGNOSIS — D509 Iron deficiency anemia, unspecified: Secondary | ICD-10-CM | POA: Diagnosis not present

## 2023-06-15 MED ORDER — SODIUM CHLORIDE 0.9 % IV SOLN
INTRAVENOUS | Status: DC
  Filled 2023-06-15: qty 250

## 2023-06-15 MED ORDER — SODIUM CHLORIDE 0.9 % IV SOLN
510.0000 mg | INTRAVENOUS | Status: DC
  Administered 2023-06-15: 510 mg via INTRAVENOUS
  Filled 2023-06-15: qty 510

## 2023-06-15 NOTE — Patient Instructions (Signed)

## 2023-07-02 ENCOUNTER — Ambulatory Visit: Payer: Self-pay

## 2023-07-02 NOTE — Telephone Encounter (Signed)
Chief Complaint: moderate diarhhea Symptoms: 4 more stools than nirmal  Frequency: 4 years Pertinent Negatives: Patient denies abd pain Disposition: [] ED /[] Urgent Care (no appt availability in office) / [x] Appointment(In office/virtual)/ []  [place] [facility]/ [] Home Care/ [] Refused Recommended Disposition /[] [place] [facility]/ []  Follow-up with PCP Additional Notes: scheduled for Friday Reason for Disposition  [1] MODERATE diarrhea (e.g., 4-6 times / day more than normal) AND [2] age > 70 years  Answer Assessment - Initial Assessment Questions 1. LOCATION: "Where does it hurt?"      Waist down  2. RADIATION: "Does the pain shoot anywhere else?" (e.g., chest, back)     no 3. ONSET: "When did the pain begin?" (e.g., minutes, hours or days ago)      .4 years  4. SUDDEN: "Gradual or sudden onset?"     *No Answer* 5. PATTERN "Does the pain come and go, or is it constant?"    - If it comes and goes: "How long does it last?" "Do you have pain now?"     (Note: Comes and goes means the pain is intermittent. It goes away completely between bouts.)    - If constant: "Is it getting better, staying the same, or getting worse?"      (Note: Constant means the pain never goes away completely; most serious pain is constant and gets worse.)      Constant  6. SEVERITY: "How bad is the pain?"  (e.g., Scale 1-10; mild, moderate, or severe)    - MILD (1-3): Doesn't interfere with normal activities, abdomen soft and not tender to touch.     - MODERATE (4-7): Interferes with normal activities or awakens from sleep, abdomen tender to touch.     - SEVERE (8-10): Excruciating pain, doubled over, unable to do any normal activities.       No  7. RECURRENT SYMPTOM: "Have you ever had this type of stomach pain before?" If Yes, ask: "When was the last time?" and "What happened that time?"      yes 8. CAUSE: "What do you think is causing the stomach pain?"     No  9. RELIEVING/AGGRAVATING FACTORS:  "What makes it better or worse?" (e.g., antacids, bending or twisting motion, bowel movement)     *No Answer* 10. OTHER SYMPTOMS: "Do you have any other symptoms?" (e.g., back pain, diarrhea, fever, urination pain, vomiting)       Diarrhea 4  Answer Assessment - Initial Assessment Questions 1. DIARRHEA SEVERITY: "How bad is the diarrhea?" "How many more stools have you had in the past 24 hours than normal?"    - NO DIARRHEA (SCALE 0)   - MILD (SCALE 1-3): Few loose or mushy BMs; increase of 1-3 stools over normal daily number of stools; mild increase in ostomy output.   -  MODERATE (SCALE 4-7): Increase of 4-6 stools daily over normal; moderate increase in ostomy output.   -  SEVERE (SCALE 8-10; OR "WORST POSSIBLE"): Increase of 7 or more stools daily over normal; moderate increase in ostomy output; incontinence.     moderate 2. ONSET: "When did the diarrhea begin?"      Chronic 4 years  4. VOMITING: "Are you also vomiting?" If Yes, ask: "How many times in the past 24 hours?"      no 5. ABDOMEN PAIN: "Are you having any abdomen pain?" If Yes, ask: "What does it feel like?" (e.g., crampy, dull, intermittent, constant)      denies 6. ABDOMEN PAIN SEVERITY: If  present, ask: "How bad is the pain?"  (e.g., Scale 1-10; mild, moderate, or severe)   - MILD (1-3): doesn't interfere with normal activities, abdomen soft and not tender to touch    - MODERATE (4-7): interferes with normal activities or awakens from sleep, abdomen tender to touch    - SEVERE (8-10): excruciating pain, doubled over, unable to do any normal activities       No pain 7. ORAL INTAKE: If vomiting, "Have you been able to drink liquids?" "How much liquids have you had in the past 24 hours?"     N/a 11. OTHER SYMPTOMS: "Do you have any other symptoms?" (e.g., fever, blood in stool)       no  Protocols used: Abdominal Pain - Female-A-AH, Diarrhea-A-AH

## 2023-07-03 ENCOUNTER — Other Ambulatory Visit: Payer: Medicare Other

## 2023-07-03 MED FILL — Ferumoxytol Inj 510 MG/17ML (30 MG/ML) (Elemental Fe): INTRAVENOUS | Qty: 17 | Status: AC

## 2023-07-04 ENCOUNTER — Encounter: Payer: Self-pay | Admitting: Oncology

## 2023-07-04 ENCOUNTER — Inpatient Hospital Stay: Payer: Medicare Other | Attending: Oncology | Admitting: Oncology

## 2023-07-04 ENCOUNTER — Inpatient Hospital Stay: Payer: Medicare Other

## 2023-07-04 VITALS — BP 106/70 | HR 85 | Temp 98.3°F | Resp 18 | Ht 63.0 in | Wt 116.2 lb

## 2023-07-04 DIAGNOSIS — D509 Iron deficiency anemia, unspecified: Secondary | ICD-10-CM | POA: Diagnosis not present

## 2023-07-04 DIAGNOSIS — F17210 Nicotine dependence, cigarettes, uncomplicated: Secondary | ICD-10-CM | POA: Insufficient documentation

## 2023-07-04 LAB — IRON AND TIBC
Iron: 113 ug/dL (ref 28–170)
Saturation Ratios: 31 % (ref 10.4–31.8)
TIBC: 371 ug/dL (ref 250–450)
UIBC: 258 ug/dL

## 2023-07-04 LAB — CBC WITH DIFFERENTIAL/PLATELET
Abs Immature Granulocytes: 0.02 10*3/uL (ref 0.00–0.07)
Basophils Absolute: 0.1 10*3/uL (ref 0.0–0.1)
Basophils Relative: 1 %
Eosinophils Absolute: 0.1 10*3/uL (ref 0.0–0.5)
Eosinophils Relative: 1 %
HCT: 36.9 % (ref 36.0–46.0)
Hemoglobin: 12.5 g/dL (ref 12.0–15.0)
Immature Granulocytes: 0 %
Lymphocytes Relative: 33 %
Lymphs Abs: 2.3 10*3/uL (ref 0.7–4.0)
MCH: 30.6 pg (ref 26.0–34.0)
MCHC: 33.9 g/dL (ref 30.0–36.0)
MCV: 90.4 fL (ref 80.0–100.0)
Monocytes Absolute: 0.6 10*3/uL (ref 0.1–1.0)
Monocytes Relative: 9 %
Neutro Abs: 3.9 10*3/uL (ref 1.7–7.7)
Neutrophils Relative %: 56 %
Platelets: 250 10*3/uL (ref 150–400)
RBC: 4.08 MIL/uL (ref 3.87–5.11)
RDW: 14.8 % (ref 11.5–15.5)
WBC: 7 10*3/uL (ref 4.0–10.5)
nRBC: 0 % (ref 0.0–0.2)

## 2023-07-04 LAB — FERRITIN: Ferritin: 253 ng/mL (ref 11–307)

## 2023-07-05 ENCOUNTER — Encounter: Payer: Self-pay | Admitting: Internal Medicine

## 2023-07-05 NOTE — Progress Notes (Signed)
Hematology/Oncology Consult note [facility]  [phone] [phone]  Patient Care Team: Duanne Limerick, MD as PCP - General (Family Medicine) Louellen Molder, NP as Nurse Practitioner (Gastroenterology) Stanton Kidney, MD as Consulting Physician (Gastroenterology) Creig Hines, MD as Consulting Physician (Oncology)   Name of the patient: Angelica Walker  [id number]  [birth year]   Date of visit: 07/05/23  Diagnosis-iron deficiency anemia  Chief complaint/ Reason for visit-routine follow-up of iron deficiency anemia  Heme/Onc history: Patient is a 82 year old female with history of iron deficiency anemia secondary to gastric ulcer and GI bleeding back in April 2019.EGD on 12/07/2017 revealed  Z-line variable.  There was a non-obstructing non-bleeding gastric ulcer with no stigmata of bleeding. The duodenum was normal. Biopsies showed chronic nonspecific gastritis.  There was no H. pylori, dysplasia or malignancy. EGD on 03/21/2018 revealed Z-line variable.  There was erosive gastritis with healing ulceration in the stomach.  There was no H. pylori, dysplasia or malignancy.  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 EGD on 07/22/2018 revealed Z-line variable.  There was non-bleeding gastric ulcer with no stigmata of bleeding.  Acquired deformity in the gastric body (posterior wall).  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      Colonoscopy on 12/07/2017 revealed three 1 to 2 mm polyps in the rectum, removed. Diverticulosis in the sigmoid colon. Tortuous colon. 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  Pathology revealed 3 hyperplastic polyps.  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      She has required IV iron periodically.   She also  gets low-dose screening CT chest on a yearly basis given her history of smoking    Interval history-patient reports feeling "swimmy headed" at times.  She has mild ongoing fatigue.  Denies any blood loss in her stool or urine.  ECOG PS- 1 Pain scale- 0   Review of systems- Review of Systems  Constitutional:  Positive for malaise/fatigue. Negative for chills, fever and weight loss.  HENT:  Negative for congestion, ear discharge and nosebleeds.   Eyes:  Negative for blurred vision.  Respiratory:  Negative for cough, hemoptysis, sputum production, shortness of breath and wheezing.   Cardiovascular:  Negative for chest pain, palpitations, orthopnea and claudication.  Gastrointestinal:  Negative for abdominal pain, blood in stool, constipation, diarrhea, heartburn, melena, nausea and vomiting.  Genitourinary:  Negative for dysuria, flank pain, frequency, hematuria and urgency.  Musculoskeletal:  Negative for back pain, joint pain and myalgias.  Skin:  Negative for rash.  Neurological:  Negative for dizziness, tingling, focal weakness, seizures, weakness and headaches.  Endo/Heme/Allergies:  Does not bruise/bleed easily.  Psychiatric/Behavioral:  Negative for depression and suicidal ideas. The patient does not have insomnia.       Allergies  Allergen Reactions   Simvastatin Other (See Comments)     Past Medical History:  Diagnosis Date   Anemia    COPD (chronic obstructive pulmonary disease) (HCC)    GERD (gastroesophageal reflux disease)    Hyperlipidemia    Hypertension    Multilevel degenerative disc disease    Serum lipids high      Past Surgical History:  Procedure Laterality Date   CATARACT EXTRACTION W/PHACO Left 06/29/2020   Procedure: CATARACT EXTRACTION PHACO AND INTRAOCULAR LENS PLACEMENT (IOC) LEFT 7.20 00:42.7;  Surgeon: Nevada Crane,  MD;  Location: [facility];  Service: Ophthalmology;  Laterality: Left;   CATARACT EXTRACTION W/PHACO Right 07/26/2020    Procedure: CATARACT EXTRACTION PHACO AND INTRAOCULAR LENS PLACEMENT (IOC) RIGHT 7.97  00:53.0;  Surgeon: Nevada Crane, MD;  Location: [facility];  Service: Ophthalmology;  Laterality: Right;   COLONOSCOPY WITH PROPOFOL N/A 12/07/2017   Procedure: COLONOSCOPY WITH PROPOFOL;  Surgeon: Christena Deem, MD;  Location: [facility]COPY;  Service: Endoscopy;  Laterality: N/A;   ESOPHAGOGASTRODUODENOSCOPY (EGD) WITH PROPOFOL N/A 12/07/2017   Procedure: ESOPHAGOGASTRODUODENOSCOPY (EGD) WITH PROPOFOL;  Surgeon: Christena Deem, MD;  Location: [facility] END[facility]COPY;  Service: Endoscopy;  Laterality: N/A;   ESOPHAGOGASTRODUODENOSCOPY (EGD) WITH PROPOFOL N/A 03/21/2018   Procedure: ESOPHAGOGASTRODUODENOSCOPY (EGD) WITH PROPOFOL;  Surgeon: Christena Deem, MD;  Location: [facility]COPY;  Service: Endoscopy;  Laterality: N/A;   ESOPHAGOGASTRODUODENOSCOPY (EGD) WITH PROPOFOL N/A 07/22/2018   Procedure: ESOPHAGOGASTRODUODENOSCOPY (EGD) WITH PROPOFOL;  Surgeon: Christena Deem, MD;  Location: [facility] ENDOSCOPY;  Service: Endoscopy;  Laterality: N/A;    Social History   Socioeconomic History   Marital status: Divorced    Spouse name: Not on file   Number of children: 1   Years of education: some college   Highest education level: 12th grade  Occupational History   Occupation: Retired  Tobacco Use   Smoking status: Every Day    Current packs/day: 1.00    Average packs/day: 1 pack/day for 54.0 years (54.0 ttl pk-yrs)    Types: Cigarettes   Smokeless tobacco: Never   Tobacco comments:    patches and pills discussed   Vaping Use   Vaping status: Never Used  Substance and Sexual Activity   Alcohol use: No   Drug use: No   Sexual activity: Not Currently  Other Topics Concern   Not on file  Social History Narrative   Not on file   Social Determinants of Health   Financial Resource Strain: Low Risk  (08/30/2022)   Overall Financial Resource Strain (CARDIA)    Difficulty of Paying  Living Expenses: Not hard at all  Food Insecurity: No Food Insecurity (08/30/2022)   Hunger Vital Sign    Worried About Running Out of Food in the Last Year: Never true    Ran Out of Food in the Last Year: Never true  Transportation Needs: No Transportation Needs (08/30/2022)   PRAPARE - Administrator, Civil Service (Medical): No    Lack of Transportation (Non-Medical): No  Physical Activity: Sufficiently Active (08/30/2022)   Exercise Vital Sign    Days of Exercise per Week: 7 days    Minutes of Exercise per Session: 60 min  Stress: No Stress Concern Present (08/30/2022)   [employer] of Occupational Health - Occupational Stress Questionnaire    Feeling of Stress : Not at all  Social Connections: Socially Isolated (08/30/2022)   Social Connection and Isolation Panel [NHANES]    Frequency of Communication with Friends and Family: More than three times a week    Frequency of Social Gatherings with Friends and Family: More than three times a week    Attends Religious Services: Never    Database administrator or Organizations: No    Attends Banker Meetings: Never    Marital Status: Never married  Intimate Partner Violence: Not At Risk (08/30/2022)   Humiliation, Afraid, Rape, and Kick questionnaire    Fear of Current or Ex-Partner: No    Emotionally Abused: No    Physically Abused: No  Sexually Abused: No    Family History  Problem Relation Age of Onset   Breast cancer Maternal Aunt      Current Outpatient Medications:    acetaminophen (TYLENOL) 500 MG tablet, Take 1,000 mg by mouth 2 (two) times daily as needed., Disp: , Rfl:    Ascorbic Acid (VITAMIN C) 100 MG tablet, Take 100 mg by mouth daily., Disp: , Rfl:    atorvastatin (LIPITOR) 10 MG tablet, Take 1 tablet (10 mg total) by mouth daily., Disp: 90 tablet, Rfl: 3   Calcium Carbonate-Vitamin D 600-200 MG-UNIT TABS, Take by mouth., Disp: , Rfl:    Cyanocobalamin (B-12) 1000 MCG TABS, Take 1  tablet by mouth daily., Disp: 30 tablet, Rfl:    gabapentin (NEURONTIN) 100 MG capsule, TAKE 1 CAPSULE BY MOUTH AT BEDTIME., Disp: 90 capsule, Rfl: 1   GLUCOSAMINE-CHONDROITIN PO, Take 3 tablets by mouth., Disp: , Rfl:    lisinopril-hydrochlorothiazide (ZESTORETIC) 20-12.5 MG tablet, TAKE ONE (1) TABLET BY MOUTH ONCE DAILY, Disp: 90 tablet, Rfl: 1   Melatonin 10 MG TABS, Take 1 tablet by mouth at bedtime., Disp: , Rfl:    MELATONIN PO, Take by mouth at bedtime as needed., Disp: , Rfl:    pantoprazole (PROTONIX) 40 MG tablet, TAKE (1) TABLET BY MOUTH EVERY DAY, Disp: 90 tablet, Rfl: 1   sertraline (ZOLOFT) 50 MG tablet, Take 1 tablet (50 mg total) by mouth daily., Disp: 30 tablet, Rfl: 3  Physical exam:  Vitals:   07/04/23 1449  BP: 106/70  Pulse: 85  Resp: 18  Temp: 98.3 F (36.8 C)  TempSrc: Tympanic  SpO2: 96%  Weight: 116 lb 3.2 oz (52.7 kg)  Height: 5\' 3"  (1.6 m)   Physical Exam Cardiovascular:     Rate and Rhythm: Normal rate and regular rhythm.     Heart sounds: Normal heart sounds.  Pulmonary:     Effort: Pulmonary effort is normal.     Breath sounds: Normal breath sounds.  Abdominal:     General: Bowel sounds are normal.     Palpations: Abdomen is soft.  Skin:    General: Skin is warm and dry.  Neurological:     Mental Status: She is alert and oriented to person, place, and time.         Latest Ref Rng & Units 09/28/2022    3:41 PM  CMP  Glucose 70 - 99 mg/dL 409   BUN 8 - 27 mg/dL 18   Creatinine 8.11 - 1.00 mg/dL 9.14   Sodium 782 - 956 mmol/L 138   Potassium 3.5 - 5.2 mmol/L 4.3   Chloride 96 - 106 mmol/L 97   CO2 20 - 29 mmol/L 22   Calcium 8.7 - 10.3 mg/dL 21.3       Latest Ref Rng & Units 07/04/2023    2:36 PM  CBC  WBC 4.0 - 10.5 K/uL 7.0   Hemoglobin 12.0 - 15.0 g/dL 08.6   Hematocrit 57.8 - 46.0 % 36.9   Platelets 150 - 400 K/uL 250      Assessment and plan- Patient is a 82 y.o. female here for routine follow-up of iron deficiency  anemia  Patient is not presently anemic with a hemoglobin of 12.5.  Ferritin levels are presently normal at 253 with normal iron studies.  She does not require any IV iron at this time.  I will continue to monitor her iron and CBC every 6 months and see her back in 1 year  Visit Diagnosis 1. Iron deficiency anemia, unspecified iron deficiency anemia type      Dr. Owens Shark, MD, MPH [facility] [phone] 07/05/2023 10:47 AM

## 2023-07-06 ENCOUNTER — Ambulatory Visit: Payer: Medicare Other | Admitting: Family Medicine

## 2023-07-31 ENCOUNTER — Encounter: Payer: Self-pay | Admitting: Family Medicine

## 2023-07-31 ENCOUNTER — Ambulatory Visit (INDEPENDENT_AMBULATORY_CARE_PROVIDER_SITE_OTHER): Payer: Medicare Other | Admitting: Family Medicine

## 2023-07-31 VITALS — BP 120/68 | HR 84 | Ht 63.0 in | Wt 117.0 lb

## 2023-07-31 DIAGNOSIS — F5101 Primary insomnia: Secondary | ICD-10-CM | POA: Diagnosis not present

## 2023-07-31 DIAGNOSIS — Z1211 Encounter for screening for malignant neoplasm of colon: Secondary | ICD-10-CM | POA: Diagnosis not present

## 2023-07-31 DIAGNOSIS — F419 Anxiety disorder, unspecified: Secondary | ICD-10-CM | POA: Diagnosis not present

## 2023-07-31 DIAGNOSIS — F32A Depression, unspecified: Secondary | ICD-10-CM | POA: Diagnosis not present

## 2023-07-31 MED ORDER — SERTRALINE HCL 50 MG PO TABS: 50.0000 mg | ORAL_TABLET | Freq: Every day | ORAL | 5 refills | Status: DC

## 2023-07-31 MED ORDER — TRAZODONE HCL 50 MG PO TABS
25.0000 mg | ORAL_TABLET | Freq: Every evening | ORAL | 3 refills | Status: DC | PRN
Start: 2023-07-31 — End: 2023-10-29

## 2023-07-31 NOTE — Progress Notes (Signed)
Date:  07/31/2023   Name:  Angelica Walker   DOB:  Sep 21, 1941   MRN:  [id number]   Chief Complaint: Depression (Increased sertraline from 25 to 50mg  in April) and change in bowel habits (Wants ref)  Depression        The current episode started more than 1 year ago.   The onset quality is gradual. The problem is unchanged.  Associated symptoms include fatigue and insomnia.  Associated symptoms include no decreased concentration, no helplessness, no hopelessness, not irritable, no restlessness, no decreased interest, no appetite change, no body aches, no myalgias, no headaches, no indigestion, not sad and no suicidal ideas.  Past treatments include SSRIs - Selective serotonin reuptake inhibitors.  Compliance with treatment is good. Insomnia Primary symptoms: difficulty falling asleep.   The current episode started more than one year. The onset quality is gradual. The problem occurs intermittently. The problem has been gradually improving since onset. PMH includes: depression.     Lab Results  Component Value Date   NA 138 09/28/2022   K 4.3 09/28/2022   CO2 22 09/28/2022   GLUCOSE 118 (H) 09/28/2022   BUN 18 09/28/2022   CREATININE 0.85 09/28/2022   CALCIUM 10.2 09/28/2022   EGFR 70 09/28/2022   GFRNONAA 85 10/07/2020   Lab Results  Component Value Date   CHOL 217 (H) 03/21/2023   HDL 53 03/21/2023   LDLCALC 139 (H) 03/21/2023   TRIG 140 03/21/2023   CHOLHDL 3.5 12/19/2017   Lab Results  Component Value Date   TSH 1.320 01/30/2023   Lab Results  Component Value Date   HGBA1C 5.9 (H) 03/21/2023   Lab Results  Component Value Date   WBC 7.0 07/04/2023   HGB 12.5 07/04/2023   HCT 36.9 07/04/2023   MCV 90.4 07/04/2023   PLT 250 07/04/2023   Lab Results  Component Value Date   ALT 8 12/19/2017   AST 18 12/19/2017   ALKPHOS 87 12/19/2017   BILITOT 0.4 12/19/2017   No results found for: "25OHVITD2", "25OHVITD3", "VD25OH"   Review of Systems  Constitutional:   Positive for fatigue. Negative for appetite change.  HENT:  Negative for congestion.   Respiratory:  Negative for chest tightness, shortness of breath and wheezing.   Cardiovascular:  Negative for chest pain, palpitations and leg swelling.  Gastrointestinal:  Negative for abdominal pain.  Endocrine: Negative for polydipsia and polyuria.  Musculoskeletal:  Negative for myalgias.  Neurological:  Negative for headaches.  Psychiatric/Behavioral:  Positive for depression. Negative for decreased concentration and suicidal ideas. The patient has insomnia.     Patient Active Problem List   Diagnosis Date Noted   Iron deficiency anemia due to chronic blood loss 07/02/2018   Smoker 03/18/2018   RLS (restless legs syndrome) 01/18/2018   Anemia due to blood loss 12/20/2017   Prediabetes 12/19/2017   Hyperlipidemia, unspecified 12/19/2017   Hypertension, essential, benign 12/19/2017   B12 deficiency 12/19/2017   DDD (degenerative disc disease), lumbar 12/19/2017   Multiple gastric ulcers 12/19/2017   COPD (chronic obstructive pulmonary disease) (HCC) 12/21/2014    Allergies  Allergen Reactions   Simvastatin Other (See Comments)    Past Surgical History:  Procedure Laterality Date   CATARACT EXTRACTION W/PHACO Left 06/29/2020   Procedure: CATARACT EXTRACTION PHACO AND INTRAOCULAR LENS PLACEMENT (IOC) LEFT 7.20 00:42.7;  Surgeon: Nevada Crane, MD;  Location: [facility];  Service: Ophthalmology;  Laterality: Left;   CATARACT EXTRACTION W/PHACO Right 07/26/2020  Procedure: CATARACT EXTRACTION PHACO AND INTRAOCULAR LENS PLACEMENT (IOC) RIGHT 7.97  00:53.0;  Surgeon: Nevada Crane, MD;  Location: [facility];  Service: Ophthalmology;  Laterality: Right;   COLONOSCOPY WITH PROPOFOL N/A 12/07/2017   Procedure: COLONOSCOPY WITH PROPOFOL;  Surgeon: Christena Deem, MD;  Location: [facility] ENDOSCOPY;  Service: Endoscopy;  Laterality: N/A;   ESOPHAGOGASTRODUODENOSCOPY (EGD)  WITH PROPOFOL N/A 12/07/2017   Procedure: ESOPHAGOGASTRODUODENOSCOPY (EGD) WITH PROPOFOL;  Surgeon: Christena Deem, MD;  Location: [facility] ENDOSCOPY;  Service: Endoscopy;  Laterality: N/A;   ESOPHAGOGASTRODUODENOSCOPY (EGD) WITH PROPOFOL N/A 03/21/2018   Procedure: ESOPHAGOGASTRODUODENOSCOPY (EGD) WITH PROPOFOL;  Surgeon: Christena Deem, MD;  Location: [facility] ENDOSCOPY;  Service: Endoscopy;  Laterality: N/A;   ESOPHAGOGASTRODUODENOSCOPY (EGD) WITH PROPOFOL N/A 07/22/2018   Procedure: ESOPHAGOGASTRODUODENOSCOPY (EGD) WITH PROPOFOL;  Surgeon: Christena Deem, MD;  Location: [facility] ENDOSCOPY;  Service: Endoscopy;  Laterality: N/A;    Social History   Tobacco Use   Smoking status: Every Day    Current packs/day: 1.00    Average packs/day: 1 pack/day for 54.0 years (54.0 ttl pk-yrs)    Types: Cigarettes   Smokeless tobacco: Never   Tobacco comments:    patches and pills discussed   Vaping Use   Vaping status: Never Used  Substance Use Topics   Alcohol use: No   Drug use: No     Medication list has been reviewed and updated.  Current Meds  Medication Sig   acetaminophen (TYLENOL) 500 MG tablet Take 1,000 mg by mouth 2 (two) times daily as needed.   Ascorbic Acid (VITAMIN C) 100 MG tablet Take 100 mg by mouth daily.   atorvastatin (LIPITOR) 10 MG tablet Take 1 tablet (10 mg total) by mouth daily.   Calcium Carbonate-Vitamin D 600-200 MG-UNIT TABS Take by mouth.   gabapentin (NEURONTIN) 100 MG capsule TAKE 1 CAPSULE BY MOUTH AT BEDTIME.   GLUCOSAMINE-CHONDROITIN PO Take 3 tablets by mouth.   lisinopril-hydrochlorothiazide (ZESTORETIC) 20-12.5 MG tablet TAKE ONE (1) TABLET BY MOUTH ONCE DAILY   Melatonin 10 MG TABS Take 1 tablet by mouth at bedtime.   MELATONIN PO Take by mouth at bedtime as needed.   pantoprazole (PROTONIX) 40 MG tablet TAKE (1) TABLET BY MOUTH EVERY DAY   sertraline (ZOLOFT) 50 MG tablet Take 1 tablet (50 mg total) by mouth daily.       07/31/2023    1:13 PM  03/21/2023   11:05 AM 01/30/2023    3:54 PM 09/28/2022    1:52 PM  GAD 7 : Generalized Anxiety Score  Nervous, Anxious, on Edge 2 1 3  0  Control/stop worrying 0 1 3 0  Worry too much - different things 0 1 3 0  Trouble relaxing 0 0 3 0  Restless 0 0 3 0  Easily annoyed or irritable 1 0 0 0  Afraid - awful might happen 0 0 0 0  Total GAD 7 Score 3 3 15  0  Anxiety Difficulty Not difficult at all Not difficult at all Somewhat difficult Not difficult at all       07/31/2023    1:11 PM 03/21/2023   11:03 AM 01/30/2023    3:53 PM  Depression screen PHQ 2/9  Decreased Interest 0 0 3  Down, Depressed, Hopeless 0 1 3  PHQ - 2 Score 0 1 6  Altered sleeping 2 1 3   Tired, decreased energy 1 0 3  Change in appetite 0 2 1  Feeling bad or failure about yourself  0 1 0  Trouble concentrating 0 0 3  Moving slowly or fidgety/restless 0 2 2  Suicidal thoughts 0 0 0  PHQ-9 Score 3 7 18   Difficult doing work/chores Not difficult at all Somewhat difficult Very difficult    BP Readings from Last 3 Encounters:  07/31/23 120/68  07/04/23 106/70  06/15/23 127/82    Physical Exam Vitals and nursing note reviewed. Exam conducted with a chaperone present.  Constitutional:      General: She is not irritable.She is not in acute distress.    Appearance: She is not diaphoretic.  HENT:     Head: Normocephalic and atraumatic.     Right Ear: Tympanic membrane and external ear normal.     Left Ear: Tympanic membrane and external ear normal.     Nose: Nose normal. No congestion.  Eyes:     General:        Right eye: No discharge.        Left eye: No discharge.     Conjunctiva/sclera: Conjunctivae normal.     Pupils: Pupils are equal, round, and reactive to light.  Neck:     Thyroid: No thyromegaly.     Vascular: No JVD.  Cardiovascular:     Rate and Rhythm: Normal rate and regular rhythm.     Heart sounds: Normal heart sounds. No murmur heard.    No friction rub. No gallop.  Pulmonary:      Effort: Pulmonary effort is normal.     Breath sounds: Normal breath sounds.  Abdominal:     General: Bowel sounds are normal.     Palpations: Abdomen is soft. There is no mass.     Tenderness: There is no abdominal tenderness. There is no guarding.  Musculoskeletal:        General: Normal range of motion.     Cervical back: Normal range of motion and neck supple.  Lymphadenopathy:     Cervical: No cervical adenopathy.  Skin:    General: Skin is warm and dry.  Neurological:     Mental Status: She is alert.     Deep Tendon Reflexes: Reflexes are normal and symmetric.     Wt Readings from Last 3 Encounters:  07/31/23 117 lb (53.1 kg)  07/04/23 116 lb 3.2 oz (52.7 kg)  03/21/23 116 lb (52.6 kg)    BP 120/68   Pulse 84   Ht 5\' 3"  (1.6 m)   Wt 117 lb (53.1 kg)   SpO2 94%   BMI 20.73 kg/m   Assessment and Plan:  1. Anxiety and depression Chronic.  Controlled.  Stable.  Recent increase in sertraline to 50 mg is doing well.  Will continue with present dose however patient has been having some increased insomnia which may be contributing to some general fatigue that we will address with problem #2 - sertraline (ZOLOFT) 50 MG tablet; Take 1 tablet (50 mg total) by mouth daily.  Dispense: 30 tablet; Refill: 5  2. Primary insomnia New onset but been going on for about a year the patient is having difficulty initiating sleep.  Will do a trial of trazodone 1/2 tablet 25 mg at bedtime to see if it enhances her sleep and then turned this may improve her disposition. - traZODone (DESYREL) 50 MG tablet; Take 0.5-1 tablets (25-50 mg total) by mouth at bedtime as needed for sleep.  Dispense: 30 tablet; Refill: 3  3. Colon cancer screening Discussed and referral placed for gastroenterology. - Ambulatory referral to Gastroenterology  Elizabeth Sauer, MD

## 2023-08-16 ENCOUNTER — Ambulatory Visit
Admission: EM | Admit: 2023-08-16 | Discharge: 2023-08-16 | Disposition: A | Discharge status: Home / Self Care | Payer: Medicare Other | Attending: Emergency Medicine | Admitting: Emergency Medicine

## 2023-08-16 ENCOUNTER — Ambulatory Visit (INDEPENDENT_AMBULATORY_CARE_PROVIDER_SITE_OTHER): Payer: Medicare Other

## 2023-08-16 DIAGNOSIS — M5010 Cervical disc disorder with radiculopathy, unspecified cervical region: Secondary | ICD-10-CM | POA: Diagnosis not present

## 2023-08-16 DIAGNOSIS — M546 Pain in thoracic spine: Secondary | ICD-10-CM | POA: Diagnosis not present

## 2023-08-16 DIAGNOSIS — M47812 Spondylosis without myelopathy or radiculopathy, cervical region: Secondary | ICD-10-CM | POA: Diagnosis not present

## 2023-08-16 DIAGNOSIS — M436 Torticollis: Secondary | ICD-10-CM | POA: Diagnosis not present

## 2023-08-16 DIAGNOSIS — M4184 Other forms of scoliosis, thoracic region: Secondary | ICD-10-CM | POA: Diagnosis not present

## 2023-08-16 DIAGNOSIS — M542 Cervicalgia: Secondary | ICD-10-CM | POA: Diagnosis not present

## 2023-08-16 DIAGNOSIS — M47814 Spondylosis without myelopathy or radiculopathy, thoracic region: Secondary | ICD-10-CM | POA: Diagnosis not present

## 2023-08-16 DIAGNOSIS — M4724 Other spondylosis with radiculopathy, thoracic region: Secondary | ICD-10-CM

## 2023-08-16 DIAGNOSIS — M4312 Spondylolisthesis, cervical region: Secondary | ICD-10-CM | POA: Diagnosis not present

## 2023-08-16 MED ORDER — DEXAMETHASONE SODIUM PHOSPHATE 10 MG/ML IJ SOLN
10.0000 mg | Freq: Once | INTRAMUSCULAR | Status: AC
  Administered 2023-08-16: 10 mg via INTRAMUSCULAR

## 2023-08-16 MED ORDER — PREDNISONE 10 MG (21) PO TBPK: ORAL_TABLET | ORAL | 0 refills | Status: DC

## 2023-08-16 MED ORDER — BACLOFEN 5 MG PO TABS: 5.0000 mg | ORAL_TABLET | Freq: Three times a day (TID) | ORAL | 0 refills | Status: DC | PRN

## 2023-08-16 NOTE — ED Triage Notes (Signed)
Pt c/o pain starting in the left hip and then going "everywhere" x2weeks  Pt states that she has stiffness in her neck and is scared when driving.  Pt can not turn her body or else she is in pain.  Pt states that she has to sleep on the couch because she can not get out of bed without pain.  Pt does not know her medications.

## 2023-08-16 NOTE — Discharge Instructions (Addendum)
Your tree showed that you have multiple levels of degeneration within your spine including at C5-6 and C6-7.  This degeneration appears to be bone-on-bone and I do believe it is what is causing the numbness in your left fifth finger.  This is also most likely attributing to the spasm in your neck.  Start taking the prednisone tomorrow to help decrease inflammation.  This should also help you with your nerve symptoms and possibly alleviate the numbness.  Taken each morning at breakfast for 6 days.  I have also prescribed you a nonsedating muscle laxer which you can start today called baclofen.  They are 5 mg tablets and you can take them every 8 hours as needed for pain.  This will help you with the spasm in your neck which should improve pain and your range of motion.  Follow the physical therapy exercises given your handout to also help improve your range of motion and aid in pain relief.  I have made a referral for you to the spine clinic for further evaluation and to discuss treatment options.

## 2023-08-16 NOTE — ED Provider Notes (Addendum)
[facility]    CSN: [id number] Arrival date & time: 08/16/23  0854      History   Chief Complaint Chief Complaint  Patient presents with   Neck Pain         HPI Angelica Walker is a 82 y.o. female.   HPI  82 year old female with a past medical history significant for hypertension, COPD, hyperlipidemia, GERD, multilevel degenerative disc disease in the lumbar spine, B12 deficiency, prediabetes, multiple gastric ulcers with resulting anemia due to blood loss and iron deficiency.  She presents today for evaluation of pain in her neck and back with resultant muscle tension and difficulty turning her head side-to-side.  She reports that she has been having to sleep on the couch because transitioning from the bed causes too much pain.  She has been alternating hot and cold as well as taking Tylenol.  Symptoms began 2 weeks ago.  They are also associated with numbness in her left fifth finger that has been present for the last 2 weeks as well.  Past Medical History:  Diagnosis Date   Anemia    COPD (chronic obstructive pulmonary disease) (HCC)    GERD (gastroesophageal reflux disease)    Hyperlipidemia    Hypertension    Multilevel degenerative disc disease    Serum lipids high     Patient Active Problem List   Diagnosis Date Noted   Iron deficiency anemia due to chronic blood loss 07/02/2018   Smoker 03/18/2018   RLS (restless legs syndrome) 01/18/2018   Anemia due to blood loss 12/20/2017   Prediabetes 12/19/2017   Hyperlipidemia, unspecified 12/19/2017   Hypertension, essential, benign 12/19/2017   B12 deficiency 12/19/2017   DDD (degenerative disc disease), lumbar 12/19/2017   Multiple gastric ulcers 12/19/2017   COPD (chronic obstructive pulmonary disease) (HCC) 12/21/2014    Past Surgical History:  Procedure Laterality Date   CATARACT EXTRACTION W/PHACO Left 06/29/2020   Procedure: CATARACT EXTRACTION PHACO AND INTRAOCULAR LENS PLACEMENT (IOC) LEFT 7.20  00:42.7;  Surgeon: Nevada Crane, MD;  Location: [facility];  Service: Ophthalmology;  Laterality: Left;   CATARACT EXTRACTION W/PHACO Right 07/26/2020   Procedure: CATARACT EXTRACTION PHACO AND INTRAOCULAR LENS PLACEMENT (IOC) RIGHT 7.97  00:53.0;  Surgeon: Nevada Crane, MD;  Location: [facility];  Service: Ophthalmology;  Laterality: Right;   COLONOSCOPY WITH PROPOFOL N/A 12/07/2017   Procedure: COLONOSCOPY WITH PROPOFOL;  Surgeon: Christena Deem, MD;  Location: [facility] ENDOSCOPY;  Service: Endoscopy;  Laterality: N/A;   ESOPHAGOGASTRODUODENOSCOPY (EGD) WITH PROPOFOL N/A 12/07/2017   Procedure: ESOPHAGOGASTRODUODENOSCOPY (EGD) WITH PROPOFOL;  Surgeon: Christena Deem, MD;  Location: [facility] ENDOSCOPY;  Service: Endoscopy;  Laterality: N/A;   ESOPHAGOGASTRODUODENOSCOPY (EGD) WITH PROPOFOL N/A 03/21/2018   Procedure: ESOPHAGOGASTRODUODENOSCOPY (EGD) WITH PROPOFOL;  Surgeon: Christena Deem, MD;  Location: [facility] ENDOSCOPY;  Service: Endoscopy;  Laterality: N/A;   ESOPHAGOGASTRODUODENOSCOPY (EGD) WITH PROPOFOL N/A 07/22/2018   Procedure: ESOPHAGOGASTRODUODENOSCOPY (EGD) WITH PROPOFOL;  Surgeon: Christena Deem, MD;  Location: [facility] ENDOSCOPY;  Service: Endoscopy;  Laterality: N/A;    OB History   No obstetric history on file.      Home Medications    Prior to Admission medications   Medication Sig Start Date End Date Taking? Authorizing Provider  Baclofen 5 MG TABS Take 1 tablet (5 mg total) by mouth 3 (three) times daily as needed. 08/16/23  Yes Becky Augusta, NP  predniSONE (STERAPRED UNI-PAK 21 TAB) 10 MG (21) TBPK tablet Take 6 tablets on  day 1, 5 tablets day 2, 4 tablets day 3, 3 tablets day 4, 2 tablets day 5, 1 tablet day 6 08/16/23  Yes Becky Augusta, NP  acetaminophen (TYLENOL) 500 MG tablet Take 1,000 mg by mouth 2 (two) times daily as needed.    [provider]  Ascorbic Acid (VITAMIN C) 100 MG tablet Take 100 mg by mouth daily.    [provider]  atorvastatin (LIPITOR) 10 MG tablet Take 1 tablet (10 mg total) by mouth daily. 03/22/23   Duanne Limerick, MD  Calcium Carbonate-Vitamin D 600-200 MG-UNIT TABS Take by mouth.    [provider]  Cyanocobalamin (B-12) 1000 MCG TABS Take 1 tablet by mouth daily. 03/19/18   Plonk, Chrissie Noa, MD  gabapentin (NEURONTIN) 100 MG capsule TAKE 1 CAPSULE BY MOUTH AT BEDTIME. 03/21/23   Duanne Limerick, MD  GLUCOSAMINE-CHONDROITIN PO Take 3 tablets by mouth.    [provider]  lisinopril-hydrochlorothiazide (ZESTORETIC) 20-12.5 MG tablet TAKE ONE (1) TABLET BY MOUTH ONCE DAILY 03/21/23   Duanne Limerick, MD  Melatonin 10 MG TABS Take 1 tablet by mouth at bedtime.    [provider]  MELATONIN PO Take by mouth at bedtime as needed.    [provider]  pantoprazole (PROTONIX) 40 MG tablet TAKE (1) TABLET BY MOUTH EVERY DAY 03/21/23   Duanne Limerick, MD  sertraline (ZOLOFT) 50 MG tablet Take 1 tablet (50 mg total) by mouth daily. 07/31/23   Duanne Limerick, MD  traZODone (DESYREL) 50 MG tablet Take 0.5-1 tablets (25-50 mg total) by mouth at bedtime as needed for sleep. 07/31/23   Duanne Limerick, MD    Family History Family History  Problem Relation Age of Onset   Breast cancer Maternal Aunt     Social History Social History   Tobacco Use   Smoking status: Every Day    Current packs/day: 1.00    Average packs/day: 1 pack/day for 54.0 years (54.0 ttl pk-yrs)    Types: Cigarettes   Smokeless tobacco: Never   Tobacco comments:    patches and pills discussed   Vaping Use   Vaping status: Never Used  Substance Use Topics   Alcohol use: No   Drug use: No     Allergies   Simvastatin   Review of Systems Review of Systems  Musculoskeletal:  Positive for back pain, myalgias, neck pain and neck stiffness.  Neurological:  Positive for numbness.     Physical Exam Triage Vital Signs ED Triage Vitals  Encounter Vitals Group     BP      Systolic BP  Percentile      Diastolic BP Percentile      Pulse      Resp      Temp      Temp src      SpO2      Weight      Height      Head Circumference      Peak Flow      Pain Score      Pain Loc      Pain Education      Exclude from Growth Chart    No data found.  Updated Vital Signs BP 101/65 (BP Location: Left Arm)   Pulse 77   Temp 98.9 F (37.2 C) (Oral)   Wt 112 lb (50.8 kg)   SpO2 97%   BMI 19.84 kg/m   Visual Acuity Right Eye Distance:  Left Eye Distance:   Bilateral Distance:    Right Eye Near:   Left Eye Near:    Bilateral Near:     Physical Exam Vitals and nursing note reviewed.  Constitutional:      Appearance: Normal appearance.  Musculoskeletal:        General: Tenderness present. No swelling or signs of injury.  Skin:    General: Skin is warm and dry.     Capillary Refill: Capillary refill takes less than 2 seconds.     Findings: No bruising or erythema.  Neurological:     General: No focal deficit present.     Mental Status: She is alert and oriented to person, place, and time.      UC Treatments / Results  Labs (all labs ordered are listed, but only abnormal results are displayed) Labs Reviewed - No data to display  EKG   Radiology No results found.  Procedures Procedures (including critical care time)  Medications Ordered in UC Medications  dexamethasone (DECADRON) injection 10 mg (has no administration in time range)    Initial Impression / Assessment and Plan / UC Course  I have reviewed the triage vital signs and the nursing notes.  Pertinent labs & imaging results that were available during my care of the patient were reviewed by me and considered in my medical decision making (see chart for details).   Patient is a nontoxic-appearing 58-year-old female presenting for evaluation of neck and upper back pain as outlined HPI above.  She does have a history of multilevel degenerative disc disease in her lumbar spine.  She reports  that she went to see a chiropractor and that her pain was worse afterwards.  He suggested that she have x-rays taken.  On exam she has no midline spinous process tenderness or step-off.  Bilateral cervical and thoracic paraspinous regions do exhibit tender muscle tension with mild spasm.  No trigger points appreciated.  She is able to achieve full flexion of her neck but extension is limited due to pain.  Lateral rotation is approximately 45 degrees to either side, which also causes pain.  Bilateral grips are 4/5.  I will obtain radiographs of patient's cervical and thoracic spine degeneration which is causing radiculopathy which is resulting in the numbness in her left fifth finger.  Cervical spine films independently reviewed and evaluated by me.  Impression: Patient has degenerative changes throughout her cervical spine with retrolisthesis of C4 and 5.  Spurring off the anterior aspect of both vertebrae, worse on C5.  Disc space between C5-6 and C6-7 are nearly completely eroded away.  Patient also has a loss of cervical lordosis.  Radiology overread is pending. Radiology impression states there are multiple degenerative changes with anterolisthesis of C3-4 and C4-5 and C6-7 and C7-T1.  Moderate degenerative disc disease noted at C5-6 and C6-7 with anterior osteophyte formation.  Thoracic spine x-rays independently reviewed and evaluated by me.  Impression: Patient has mild scoliosis of her thoracic spine with disc space narrowing at multiple levels.  Radiology overread is pending. Impression states her multilevel degenerative changes with no acute abnormality.  Minimal S-shaped scoliosis of thoracic spine is noted.  I will discharge patient home with a diagnosis of cervical radiculopathy and refer her to the spine clinic for further evaluation and management.  Given her history of ulcers I do not want to use NSAIDs so I will prescribe prednisone for her to start taking tomorrow morning.  We will  administer IM injection of  Decadron here in clinic.  I will also prescribe 5 mg of baclofen that she can take 3 times a day to help with spasm.  Moist heat at home physical therapy to assist with improve range of motion and pain.   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  This degeneration appears to be bone-on-bone and I do believe it is what is causing the numbness in your left fifth finger.  This is also most likely attributing to the spasm in your neck.  Start taking the prednisone tomorrow to help decrease inflammation.  This should also help you with your nerve symptoms and possibly alleviate the numbness.  Taken each morning at breakfast for 6 days.  I have also prescribed you a nonsedating muscle laxer which you can start today called baclofen.  They are 5 mg tablets and you can take them every 8 hours as needed for pain.  This will help you with the spasm in your neck which should improve pain and your range of motion.  Follow the physical therapy exercises given your handout to also help improve your range of motion and aid in pain relief.  I have made a referral for you to the spine clinic for further evaluation and to discuss treatment options.     ED Prescriptions     Medication Sig Dispense Auth. Provider   predniSONE (STERAPRED UNI-PAK 21 TAB) 10 MG (21) TBPK tablet Take 6 tablets on day 1, 5 tablets day 2, 4 tablets day 3, 3 tablets day 4, 2 tablets day 5, 1 tablet day 6 21 tablet Becky Augusta, NP   Baclofen 5 MG TABS Take 1 tablet (5 mg total) by mouth 3 (three) times daily as needed. 30 tablet Becky Augusta, NP      PDMP not reviewed this encounter.   Becky Augusta,  NP 08/16/23 1115    Becky Augusta, NP 08/16/23 1213

## 2023-09-03 ENCOUNTER — Ambulatory Visit
Admission: EM | Admit: 2023-09-03 | Discharge: 2023-09-03 | Disposition: A | Discharge status: Home / Self Care | Payer: Medicare Other | Attending: Emergency Medicine | Admitting: Emergency Medicine

## 2023-09-03 ENCOUNTER — Encounter: Payer: Self-pay | Admitting: Emergency Medicine

## 2023-09-03 DIAGNOSIS — R42 Dizziness and giddiness: Secondary | ICD-10-CM

## 2023-09-03 DIAGNOSIS — R4789 Other speech disturbances: Secondary | ICD-10-CM | POA: Diagnosis not present

## 2023-09-03 NOTE — ED Provider Notes (Signed)
[facility]    CSN: [id number] Arrival date & time: 09/03/23  1436      History   Chief Complaint Chief Complaint  Patient presents with   Dizziness   Ear Fullness    HPI Angelica Walker is a 82 y.o. female.   HPI  82 year old female with a past medical history significant for anemia, COPD, GERD, hypertension, hyperlipidemia, and multilevel degenerative disc disease presents for evaluation of ear fullness and feeling off balance.  This has been going on for "some time now".  She also reports that she has been having trouble remembering and has been having word finding difficulty which has been going on for months.  She denies any headache, numbness, tingling, or weakness in extremities, or syncope.  She reports that she did mow her lawn with a push mower 2 days ago.  Patient is here with her daughter.  Past Medical History:  Diagnosis Date   Anemia    COPD (chronic obstructive pulmonary disease) (HCC)    GERD (gastroesophageal reflux disease)    Hyperlipidemia    Hypertension    Multilevel degenerative disc disease    Serum lipids high     Patient Active Problem List   Diagnosis Date Noted   Iron deficiency anemia due to chronic blood loss 07/02/2018   Smoker 03/18/2018   RLS (restless legs syndrome) 01/18/2018   Anemia due to blood loss 12/20/2017   Prediabetes 12/19/2017   Hyperlipidemia, unspecified 12/19/2017   Hypertension, essential, benign 12/19/2017   B12 deficiency 12/19/2017   DDD (degenerative disc disease), lumbar 12/19/2017   Multiple gastric ulcers 12/19/2017   COPD (chronic obstructive pulmonary disease) (HCC) 12/21/2014    Past Surgical History:  Procedure Laterality Date   CATARACT EXTRACTION W/PHACO Left 06/29/2020   Procedure: CATARACT EXTRACTION PHACO AND INTRAOCULAR LENS PLACEMENT (IOC) LEFT 7.20 00:42.7;  Surgeon: Nevada Crane, MD;  Location: [facility];  Service: Ophthalmology;  Laterality: Left;   CATARACT  EXTRACTION W/PHACO Right 07/26/2020   Procedure: CATARACT EXTRACTION PHACO AND INTRAOCULAR LENS PLACEMENT (IOC) RIGHT 7.97  00:53.0;  Surgeon: Nevada Crane, MD;  Location: [facility];  Service: Ophthalmology;  Laterality: Right;   COLONOSCOPY WITH PROPOFOL N/A 12/07/2017   Procedure: COLONOSCOPY WITH PROPOFOL;  Surgeon: Christena Deem, MD;  Location: [facility] ENDOSCOPY;  Service: Endoscopy;  Laterality: N/A;   ESOPHAGOGASTRODUODENOSCOPY (EGD) WITH PROPOFOL N/A 12/07/2017   Procedure: ESOPHAGOGASTRODUODENOSCOPY (EGD) WITH PROPOFOL;  Surgeon: Christena Deem, MD;  Location: [facility] ENDOSCOPY;  Service: Endoscopy;  Laterality: N/A;   ESOPHAGOGASTRODUODENOSCOPY (EGD) WITH PROPOFOL N/A 03/21/2018   Procedure: ESOPHAGOGASTRODUODENOSCOPY (EGD) WITH PROPOFOL;  Surgeon: Christena Deem, MD;  Location: [facility] ENDOSCOPY;  Service: Endoscopy;  Laterality: N/A;   ESOPHAGOGASTRODUODENOSCOPY (EGD) WITH PROPOFOL N/A 07/22/2018   Procedure: ESOPHAGOGASTRODUODENOSCOPY (EGD) WITH PROPOFOL;  Surgeon: Christena Deem, MD;  Location: [facility] END[facility]COPY;  Service: Endoscopy;  Laterality: N/A;    OB History   No obstetric history on file.      Home Medications    Prior to Admission medications   Medication Sig Start Date End Date Taking? Authorizing Provider  acetaminophen (TYLENOL) 500 MG tablet Take 1,000 mg by mouth 2 (two) times daily as needed.    [provider]  Ascorbic Acid (VITAMIN C) 100 MG tablet Take 100 mg by mouth daily.    [provider]  atorvastatin (LIPITOR) 10 MG tablet Take 1 tablet (10 mg total) by mouth daily. 03/22/23   Duanne Limerick, MD  Baclofen 5 MG TABS Take 1 tablet (5 mg total) by mouth 3 (three) times daily as needed. 08/16/23   Becky Augusta, NP  Calcium Carbonate-Vitamin D 600-200 MG-UNIT TABS Take by mouth.    [provider]  Cyanocobalamin (B-12) 1000 MCG TABS Take 1 tablet by mouth daily. 03/19/18   Plonk, Chrissie Noa, MD  gabapentin (NEURONTIN)  100 MG capsule TAKE 1 CAPSULE BY MOUTH AT BEDTIME. 03/21/23   Duanne Limerick, MD  GLUCOSAMINE-CHONDROITIN PO Take 3 tablets by mouth.    [provider]  lisinopril-hydrochlorothiazide (ZESTORETIC) 20-12.5 MG tablet TAKE ONE (1) TABLET BY MOUTH ONCE DAILY 03/21/23   Duanne Limerick, MD  Melatonin 10 MG TABS Take 1 tablet by mouth at bedtime.    [provider]  MELATONIN PO Take by mouth at bedtime as needed.    [provider]  pantoprazole (PROTONIX) 40 MG tablet TAKE (1) TABLET BY MOUTH EVERY DAY 03/21/23   Duanne Limerick, MD  predniSONE (STERAPRED UNI-PAK 21 TAB) 10 MG (21) TBPK tablet Take 6 tablets on day 1, 5 tablets day 2, 4 tablets day 3, 3 tablets day 4, 2 tablets day 5, 1 tablet day 6 08/16/23   Becky Augusta, NP  sertraline (ZOLOFT) 50 MG tablet Take 1 tablet (50 mg total) by mouth daily. 07/31/23   Duanne Limerick, MD  traZODone (DESYREL) 50 MG tablet Take 0.5-1 tablets (25-50 mg total) by mouth at bedtime as needed for sleep. 07/31/23   Duanne Limerick, MD    Family History Family History  Problem Relation Age of Onset   Breast cancer Maternal Aunt     Social History Social History   Tobacco Use   Smoking status: Every Day    Current packs/day: 1.00    Average packs/day: 1 pack/day for 54.0 years (54.0 ttl pk-yrs)    Types: Cigarettes   Smokeless tobacco: Never   Tobacco comments:    patches and pills discussed   Vaping Use   Vaping status: Never Used  Substance Use Topics   Alcohol use: No   Drug use: No     Allergies   Simvastatin   Review of Systems Review of Systems  Constitutional:  Negative for fever.  Eyes:  Positive for visual disturbance.       Blurry vision for over a year  Neurological:  Positive for dizziness. Negative for syncope, weakness, numbness and headaches.     Physical Exam Triage Vital Signs ED Triage Vitals  Encounter Vitals Group     BP      Systolic BP Percentile      Diastolic BP Percentile      Pulse       Resp      Temp      Temp src      SpO2      Weight      Height      Head Circumference      Peak Flow      Pain Score      Pain Loc      Pain Education      Exclude from Growth Chart    Orthostatic VS for the past 24 hrs:  BP- Lying Pulse- Lying BP- Sitting Pulse- Sitting BP- Standing at 0 minutes Pulse- Standing at 0 minutes  09/03/23 1504 (!) 132/99 70 (!) 152/91 82 124/85 87    Updated Vital Signs BP (!) 140/88 (BP Location: Left Arm)   Pulse 83   Temp  98.2 F (36.8 C)   Resp 18   SpO2 100%   Visual Acuity Right Eye Distance:   Left Eye Distance:   Bilateral Distance:    Right Eye Near:   Left Eye Near:    Bilateral Near:     Physical Exam Vitals and nursing note reviewed.  Constitutional:      Appearance: Normal appearance. She is not ill-appearing.  HENT:     Head: Normocephalic and atraumatic.     Right Ear: Tympanic membrane, ear canal and external ear normal. There is no impacted cerumen.     Left Ear: Tympanic membrane, ear canal and external ear normal. There is no impacted cerumen.     Mouth/Throat:     Mouth: Mucous membranes are moist.     Pharynx: Oropharynx is clear. No oropharyngeal exudate or posterior oropharyngeal erythema.  Cardiovascular:     Rate and Rhythm: Normal rate and regular rhythm.     Pulses: Normal pulses.     Heart sounds: Normal heart sounds. No murmur heard.    No friction rub. No gallop.  Pulmonary:     Effort: Pulmonary effort is normal.     Breath sounds: Normal breath sounds. No wheezing, rhonchi or rales.  Musculoskeletal:        General: No tenderness or signs of injury.  Skin:    General: Skin is warm and dry.     Capillary Refill: Capillary refill takes less than 2 seconds.  Neurological:     General: No focal deficit present.     Mental Status: She is alert and oriented to person, place, and time.     Cranial Nerves: No cranial nerve deficit.     Sensory: No sensory deficit.     Motor: No weakness.      Gait: Gait normal.  Psychiatric:        Mood and Affect: Mood normal.        Behavior: Behavior normal.        Thought Content: Thought content normal.        Judgment: Judgment normal.      UC Treatments / Results  Labs (all labs ordered are listed, but only abnormal results are displayed) Labs Reviewed - No data to display  EKG   Radiology No results found.  Procedures Procedures (including critical care time)  Medications Ordered in UC Medications - No data to display  Initial Impression / Assessment and Plan / UC Course  I have reviewed the triage vital signs and the nursing notes.  Pertinent labs & imaging results that were available during my care of the patient were reviewed by me and considered in my medical decision making (see chart for details).   Patient is a nontoxic-appearing 82 year old female presenting for evaluation of neurologic issues that have been present for an unknown length of time but include blurry vision for over a year, word finding difficulty, ear fullness and a feeling of being off balance.  She is here with her daughter who is concerned that patient may have some dementia.  The patient does see Dr. Elizabeth Sauer at [facility] and it is unclear if these concerns have been voiced with Dr. Yetta Barre or not.  When asked direct questions the patient does tend to deflect.  I gave the patient 3 words to remember, back, ball, and glove and had her repeat them back to me immediately.  I then asked the same questions at the end of my physical exam and she  was able to tell me bat and ball but not glove.  I then asked patient to drawl 330 on a piece of paper for me with a minute in our hand.  She was not able to complete the task that was directed towards her.  On exam the patient has bilateral EACs that are clear and pearly gray tympanic membranes.  There is no effusion, infection, or cerumen impaction noted.  Cranial nerves II through XII are intact and patient  is moving all extremities independently.  Bilateral grips, upper extremity strength, and lower extremity strength are all 5/5.  I will order orthostatic vital signs.  Ortho vital signs do reveal mild orthostasis which may account for some of the patient's feeling off balance.  I will encourage her to increase her oral fluid intake as well as her salt intake to help improve hydration and see if that helps resolve her ear fullness and feeling off balance.  I do feel that she would benefit from a neurology evaluation for evaluation of mental status and possible dementia.  Final Clinical Impressions(s) / UC Diagnoses   Final diagnoses:  Lightheadedness  Word finding difficulty     Discharge Instructions      Your vital signs did demonstrate that your blood pressure drops and your heart rate goes up slightly when you change positions and this may be causing some of the off-balance feeling that you are experiencing.  I would recommend that you increase your oral fluid intake to help improve hydration and see if this improves your symptoms.  I am also going to refer you to neurology for an evaluation regarding your word finding difficulty and blurry vision.  I would also keep your appointment with ophthalmology for an eye exam to evaluate for alternative causes of your blurry vision.  If you develop a headache, new changes in your vision, numbness, tingling, or weakness in any of your extremities you need to call 911 and go to the ER for evaluation.     ED Prescriptions   None    PDMP not reviewed this encounter.   Becky Augusta, NP 09/03/23 1512

## 2023-09-03 NOTE — Discharge Instructions (Addendum)
Your vital signs did demonstrate that your blood pressure drops and your heart rate goes up slightly when you change positions and this may be causing some of the off-balance feeling that you are experiencing.  I would recommend that you increase your oral fluid intake to help improve hydration and see if this improves your symptoms.  I am also going to refer you to neurology for an evaluation regarding your word finding difficulty and blurry vision.  I would also keep your appointment with ophthalmology for an eye exam to evaluate for alternative causes of your blurry vision.  If you develop a headache, new changes in your vision, numbness, tingling, or weakness in any of your extremities you need to call 911 and go to the ER for evaluation.

## 2023-09-03 NOTE — ED Triage Notes (Signed)
Pt c/o ear fullness and feeling dizziness for some time now. Pt also states she does not remember when she has to do things.

## 2023-09-05 ENCOUNTER — Ambulatory Visit (INDEPENDENT_AMBULATORY_CARE_PROVIDER_SITE_OTHER): Payer: Medicare Other

## 2023-09-05 DIAGNOSIS — Z0000 Encounter for general adult medical examination without abnormal findings: Secondary | ICD-10-CM

## 2023-09-05 NOTE — Patient Instructions (Addendum)
Angelica Walker , Thank you for taking time to come for your Medicare Wellness Visit. I appreciate your ongoing commitment to your health goals. Please review the following plan we discussed and let me know if I can assist you in the future.   Referrals/Orders/Follow-Ups/Clinician Recommendations: none  This is a list of the screening recommended for you and due dates:  Health Maintenance  Topic Date Due   Eye exam for diabetics  06/14/2021   COVID-19 Vaccine (5 - 2023-24 season) 08/19/2023   Yearly kidney function blood test for diabetes  09/29/2023   Zoster (Shingles) Vaccine (1 of 2) 10/31/2023*   Flu Shot  03/17/2024*   Mammogram  07/30/2024*   Yearly kidney health urinalysis for diabetes  03/20/2028*   Hemoglobin A1C  09/20/2023   Screening for Lung Cancer  12/01/2023   Medicare Annual Wellness Visit  09/04/2024   DTaP/Tdap/Td vaccine (2 - Td or Tdap) 12/21/2024   Pneumonia Vaccine  Completed   DEXA scan (bone density measurement)  Completed   HPV Vaccine  Aged Out   Complete foot exam   Discontinued   Colon Cancer Screening  Discontinued  *Topic was postponed. The date shown is not the original due date.    Advanced directives: (ACP Link)Information on Advanced Care Planning can be found at [facility] Directives Advance Health Care Directives ([email or web address]    Next Medicare Annual Wellness Visit scheduled for next year: Yes   09/10/24 @ 10:30 am in person

## 2023-09-05 NOTE — Progress Notes (Signed)
Subjective:   Angelica Walker is a 82 y.o. female who presents for Medicare Annual (Subsequent) preventive examination.  Visit Complete: Virtual  I connected with  Angelica Walker on 09/05/23 by a audio enabled telemedicine application and verified that I am speaking with the correct person using two identifiers.  Patient Location: Home  Provider Location: Office/Clinic  I discussed the limitations of evaluation and management by telemedicine. The patient expressed understanding and agreed to proceed.  Vital Signs: Unable to obtain new vitals due to this being a telehealth visit.  Cardiac Risk Factors include: advanced age (>70men, >51 women);hypertension;dyslipidemia     Objective:    Today's Vitals   09/05/23 1347  PainSc: 0-No pain   There is no height or weight on file to calculate BMI.     09/05/2023    1:53 PM 09/03/2023    2:45 PM 08/16/2023    9:51 AM 07/04/2023    2:54 PM 06/15/2023    2:42 PM 11/28/2022    1:08 PM 08/30/2022    3:40 PM  Advanced Directives  Does Patient Have a Medical Advance Directive? No Yes Yes Yes No No No  Type of Best boy of [place];Living will Healthcare Power of [place];Living will     Would patient like information on creating a medical advance directive? No - Patient declined      Yes (Inpatient - patient defers creating a medical advance directive at this time - Information given)    Current Medications (verified) Outpatient Encounter Medications as of 09/05/2023  Medication Sig   acetaminophen (TYLENOL) 500 MG tablet Take 1,000 mg by mouth 2 (two) times daily as needed.   Ascorbic Acid (VITAMIN C) 100 MG tablet Take 100 mg by mouth daily.   atorvastatin (LIPITOR) 10 MG tablet Take 1 tablet (10 mg total) by mouth daily.   Baclofen 5 MG TABS Take 1 tablet (5 mg total) by mouth 3 (three) times daily as needed.   Calcium Carbonate-Vitamin D 600-200 MG-UNIT TABS Take by mouth.   Cyanocobalamin (B-12) 1000 MCG  TABS Take 1 tablet by mouth daily.   gabapentin (NEURONTIN) 100 MG capsule TAKE 1 CAPSULE BY MOUTH AT BEDTIME.   GLUCOSAMINE-CHONDROITIN PO Take 3 tablets by mouth.   lisinopril-hydrochlorothiazide (ZESTORETIC) 20-12.5 MG tablet TAKE ONE (1) TABLET BY MOUTH ONCE DAILY   MELATONIN PO Take by mouth at bedtime as needed.   predniSONE (STERAPRED UNI-PAK 21 TAB) 10 MG (21) TBPK tablet Take 6 tablets on day 1, 5 tablets day 2, 4 tablets day 3, 3 tablets day 4, 2 tablets day 5, 1 tablet day 6   sertraline (ZOLOFT) 50 MG tablet Take 1 tablet (50 mg total) by mouth daily.   traZODone (DESYREL) 50 MG tablet Take 0.5-1 tablets (25-50 mg total) by mouth at bedtime as needed for sleep.   pantoprazole (PROTONIX) 40 MG tablet TAKE (1) TABLET BY MOUTH EVERY DAY   [DISCONTINUED] Melatonin 10 MG TABS Take 1 tablet by mouth at bedtime.   No facility-administered encounter medications on file as of 09/05/2023.    Allergies (verified) Simvastatin   History: Past Medical History:  Diagnosis Date   Anemia    COPD (chronic obstructive pulmonary disease) (HCC)    GERD (gastroesophageal reflux disease)    Hyperlipidemia    Hypertension    Multilevel degenerative disc disease    Serum lipids high    Past Surgical History:  Procedure Laterality Date   CATARACT EXTRACTION [place] Left 06/29/2020  Procedure: CATARACT EXTRACTION PHACO AND INTRAOCULAR LENS PLACEMENT (IOC) LEFT 7.20 00:42.7;  Surgeon: Nevada Crane, MD;  Location: [facility];  Service: Ophthalmology;  Laterality: Left;   CATARACT EXTRACTION W/PHACO Right 07/26/2020   Procedure: CATARACT EXTRACTION PHACO AND INTRAOCULAR LENS PLACEMENT (IOC) RIGHT 7.97  00:53.0;  Surgeon: Nevada Crane, MD;  Location: [facility];  Service: Ophthalmology;  Laterality: Right;   COLONOSCOPY WITH PROPOFOL N/A 12/07/2017   Procedure: COLONOSCOPY WITH PROPOFOL;  Surgeon: Christena Deem, MD;  Location: [facility] ENDOSCOPY;  Service: Endoscopy;   Laterality: N/A;   ESOPHAGOGASTRODUODENOSCOPY (EGD) WITH PROPOFOL N/A 12/07/2017   Procedure: ESOPHAGOGASTRODUODENOSCOPY (EGD) WITH PROPOFOL;  Surgeon: Christena Deem, MD;  Location: [facility] END[facility]COPY;  Service: Endoscopy;  Laterality: N/A;   ESOPHAGOGASTRODUODENOSCOPY (EGD) WITH PROPOFOL N/A 03/21/2018   Procedure: ESOPHAGOGASTRODUODENOSCOPY (EGD) WITH PROPOFOL;  Surgeon: Christena Deem, MD;  Location: [facility] END[facility]COPY;  Service: Endoscopy;  Laterality: N/A;   ESOPHAGOGASTRODUODENOSCOPY (EGD) WITH PROPOFOL N/A 07/22/2018   Procedure: ESOPHAGOGASTRODUODENOSCOPY (EGD) WITH PROPOFOL;  Surgeon: Christena Deem, MD;  Location: [facility] ENDOSCOPY;  Service: Endoscopy;  Laterality: N/A;   Family History  Problem Relation Age of Onset   Breast cancer Maternal Aunt    Social History   Socioeconomic History   Marital status: Divorced    Spouse name: Not on file   Number of children: 1   Years of education: some college   Highest education level: 12th grade  Occupational History   Occupation: Retired  Tobacco Use   Smoking status: Every Day    Current packs/day: 1.00    Average packs/day: 1 pack/day for 54.0 years (54.0 ttl pk-yrs)    Types: Cigarettes   Smokeless tobacco: Never   Tobacco comments:    patches and pills discussed   Vaping Use   Vaping status: Never Used  Substance and Sexual Activity   Alcohol use: No   Drug use: No   Sexual activity: Not Currently  Other Topics Concern   Not on file  Social History Narrative   Not on file   Social Determinants of Health   Financial Resource Strain: Low Risk  (09/05/2023)   Overall Financial Resource Strain (CARDIA)    Difficulty of Paying Living Expenses: Not hard at all  Food Insecurity: No Food Insecurity (09/05/2023)   Hunger Vital Sign    Worried About Running Out of Food in the Last Year: Never true    Ran Out of Food in the Last Year: Never true  Transportation Needs: No Transportation Needs (09/05/2023)   PRAPARE -  Administrator, Civil Service (Medical): No    Lack of Transportation (Non-Medical): No  Physical Activity: Insufficiently Active (09/05/2023)   Exercise Vital Sign    Days of Exercise per Week: 3 days    Minutes of Exercise per Session: 40 min  Stress: No Stress Concern Present (09/05/2023)   [employer] of Occupational Health - Occupational Stress Questionnaire    Feeling of Stress : Not at all  Social Connections: Socially Isolated (09/05/2023)   Social Connection and Isolation Panel [NHANES]    Frequency of Communication with Friends and Family: More than three times a week    Frequency of Social Gatherings with Friends and Family: More than three times a week    Attends Religious Services: Never    Database administrator or Organizations: No    Attends Banker Meetings: Never    Marital Status: Never married    Tobacco  Counseling Ready to quit: Not Answered Counseling given: Not Answered Tobacco comments: patches and pills discussed    Clinical Intake:  Pre-visit preparation completed: Yes  Pain : No/denies pain Pain Score: 0-No pain     Nutritional Risks: None Diabetes: No  How often do you need to have someone help you when you read instructions, pamphlets, or other written materials from your doctor or pharmacy?: 1 - Never  Interpreter Needed?: No  Information entered by :: Kennedy Bucker, LPN   Activities of Daily Living    09/05/2023    1:54 PM  In your present state of health, do you have any difficulty performing the following activities:  Hearing? 0  Vision? 0  Difficulty concentrating or making decisions? 0  Walking or climbing stairs? 0  Dressing or bathing? 0  Doing errands, shopping? 0  Preparing Food and eating ? N  Using the Toilet? N  In the past six months, have you accidently leaked urine? N  Do you have problems with loss of bowel control? N  Managing your Medications? N  Managing your Finances? N   Housekeeping or managing your Housekeeping? N    Patient Care Team: Duanne Limerick, MD as PCP - General (Family Medicine) Louellen Molder, NP as Nurse Practitioner (Gastroenterology) Stanton Kidney, MD as Consulting Physician (Gastroenterology) Creig Hines, MD as Consulting Physician (Oncology)  Indicate any recent Medical Services you may have received from other than [facility] providers in the past year (date may be approximate).     Assessment:   This is a routine wellness examination for [place].  Hearing/Vision screen Hearing Screening - Comments:: No aids Vision Screening - Comments:: Wears glasses- Dr.Shade   Goals Addressed             This Visit's Progress    DIET - EAT MORE FRUITS AND VEGETABLES         Depression Screen    09/05/2023    1:51 PM 07/31/2023    1:11 PM 03/21/2023   11:03 AM 01/30/2023    3:53 PM 09/28/2022    1:52 PM 08/30/2022    3:39 PM 03/28/2022    1:20 PM  PHQ 2/9 Scores  PHQ - 2 Score 0 0 1 6 0 0 0  PHQ- 9 Score 0 3 7 18  0 3 0    Fall Risk    09/05/2023    1:54 PM 07/31/2023    1:11 PM 03/21/2023   11:03 AM 01/30/2023    3:54 PM 09/28/2022    1:51 PM  Fall Risk   Falls in the past year? 0 0 0 0 0  Number falls in past yr: 0 0 0 0 0  Injury with Fall? 0 0 0 0 0  Risk for fall due to : No Fall Risks No Fall Risks No Fall Risks History of fall(s) No Fall Risks  Follow up Falls prevention discussed;Falls evaluation completed Falls evaluation completed Falls evaluation completed Falls evaluation completed Falls evaluation completed    MEDICARE RISK AT HOME:    TIMED UP AND GO:  Was the test performed?  No    Cognitive Function:        08/30/2022    3:40 PM 03/13/2018   11:53 AM  6CIT Screen  What Year? 4 points 0 points  What month? 0 points 0 points  What time? 0 points 3 points  Count back from 20 4 points 0 points  Months in reverse 0 points 0 points  Repeat phrase 0 points 4 points  Total Score 8 points 7  points    Immunizations Immunization History  Administered Date(s) Administered   Fluad Quad(high Dose 65+) 10/07/2020, 09/27/2021, 09/28/2022   PFIZER(Purple Top)SARS-COV-2 Vaccination 02/20/2020, 03/12/2020, 10/04/2020, 04/07/2021   Pneumococcal Conjugate-13 02/07/2017   Pneumococcal Polysaccharide-23 12/21/2014   Tdap 12/21/2014    TDAP status: Up to date  Flu Vaccine status: Up to date  Pneumococcal vaccine status: Up to date  Covid-19 vaccine status: Completed vaccines  Qualifies for Shingles Vaccine? Yes   Zostavax completed No   Shingrix Completed?: No.    Education has been provided regarding the importance of this vaccine. Patient has been advised to call insurance company to determine out of pocket expense if they have not yet received this vaccine. Advised may also receive vaccine at local pharmacy or Health Dept. Verbalized acceptance and understanding.  Screening Tests Health Maintenance  Topic Date Due   OPHTHALMOLOGY EXAM  06/14/2021   COVID-19 Vaccine (5 - 2023-24 season) 08/19/2023   Diabetic kidney evaluation - eGFR measurement  09/29/2023   Zoster Vaccines- Shingrix (1 of 2) 10/31/2023 (Originally 04/19/1991)   INFLUENZA VACCINE  03/17/2024 (Originally 07/19/2023)   MAMMOGRAM  07/30/2024 (Originally 04/27/2023)   Diabetic kidney evaluation - Urine ACR  03/20/2028 (Originally 04/19/1959)   HEMOGLOBIN A1C  09/20/2023   Lung Cancer Screening  12/01/2023   Medicare Annual Wellness (AWV)  09/04/2024   DTaP/Tdap/Td (2 - Td or Tdap) 12/21/2024   Pneumonia Vaccine 41+ Years old  Completed   DEXA SCAN  Completed   HPV VACCINES  Aged Out   FOOT EXAM  Discontinued   Colonoscopy  Discontinued    Health Maintenance  Health Maintenance Due  Topic Date Due   OPHTHALMOLOGY EXAM  06/14/2021   COVID-19 Vaccine (5 - 2023-24 season) 08/19/2023   Diabetic kidney evaluation - eGFR measurement  09/29/2023    Colorectal cancer screening: No longer required.   Mammogram  status: No longer required due to age.  Bone Density status: Completed 02/22/17. Results reflect: Bone density results: NORMAL. Repeat every 5 years.-declined referral  Lung Cancer Screening: (Low Dose CT Chest recommended if Age 24-80 years, 20 pack-year currently smoking OR have quit w/in 15years.) does qualify.   Lung Cancer Screening Referral: had one on 11/30/22  Additional Screening:  Hepatitis C Screening: does not qualify; Completed no  Vision Screening: Recommended annual ophthalmology exams for early detection of glaucoma and other disorders of the eye. Is the patient up to date with their annual eye exam?  Yes  Who is the provider or what is the name of the office in which the patient attends annual eye exams? Dr.Shade If pt is not established with a provider, would they like to be referred to a provider to establish care? No .   Dental Screening: Recommended annual dental exams for proper oral hygiene    Community Resource Referral / Chronic Care Management: CRR required this visit?  No   CCM required this visit?  No     Plan:     I have personally reviewed and noted the following in the patient's chart:   Medical and social history Use of alcohol, tobacco or illicit drugs  Current medications and supplements including opioid prescriptions. Patient is not currently taking opioid prescriptions. Functional ability and status Nutritional status Physical activity Advanced directives List of other physicians Hospitalizations, surgeries, and ER visits in previous 12 months Vitals Screenings to include cognitive, depression, and falls Referrals and appointments  In addition, I have reviewed and discussed with patient certain preventive protocols, quality metrics, and best practice recommendations. A written personalized care plan for preventive services as well as general preventive health recommendations were provided to patient.     Hal Hope,  LPN   [id number]   After Visit Summary: (MyChart) Due to this being a telephonic visit, the after visit summary with patients personalized plan was offered to patient via MyChart   Nurse Notes: none

## 2023-09-06 ENCOUNTER — Telehealth: Payer: Self-pay | Admitting: Family Medicine

## 2023-09-06 ENCOUNTER — Other Ambulatory Visit: Payer: Self-pay

## 2023-09-06 ENCOUNTER — Telehealth: Payer: Self-pay

## 2023-09-06 DIAGNOSIS — R2681 Unsteadiness on feet: Secondary | ICD-10-CM

## 2023-09-06 NOTE — Telephone Encounter (Signed)
Copied from CRM [id number]. Topic: Referral - Request for Referral >> Sep 06, 2023  9:45 AM Patsy Lager T wrote: Has patient seen PCP for this complaint? Yes.   Referral for which specialty: Neurologist Preferred provider/office: [facility] [address] [place] Reason for referral: dizziness, word finding difficulty and off balance

## 2023-09-06 NOTE — Telephone Encounter (Signed)
Referral placed.  KP

## 2023-09-06 NOTE — Telephone Encounter (Signed)
Please call pts daughter to schedule an appt for referral for neurology. Pt needs an office visit before she can be seen.  KP

## 2023-09-07 NOTE — Telephone Encounter (Signed)
Noted  KP

## 2023-09-11 ENCOUNTER — Ambulatory Visit (INDEPENDENT_AMBULATORY_CARE_PROVIDER_SITE_OTHER): Payer: Medicare Other | Admitting: Family Medicine

## 2023-09-11 ENCOUNTER — Telehealth: Payer: Self-pay | Admitting: Family Medicine

## 2023-09-11 ENCOUNTER — Encounter: Payer: Self-pay | Admitting: Family Medicine

## 2023-09-11 VITALS — BP 136/78 | HR 104 | Ht 63.0 in | Wt 113.0 lb

## 2023-09-11 DIAGNOSIS — F17200 Nicotine dependence, unspecified, uncomplicated: Secondary | ICD-10-CM

## 2023-09-11 DIAGNOSIS — R270 Ataxia, unspecified: Secondary | ICD-10-CM | POA: Diagnosis not present

## 2023-09-11 DIAGNOSIS — R4189 Other symptoms and signs involving cognitive functions and awareness: Secondary | ICD-10-CM

## 2023-09-11 DIAGNOSIS — R413 Other amnesia: Secondary | ICD-10-CM | POA: Diagnosis not present

## 2023-09-11 NOTE — Telephone Encounter (Signed)
Copied from CRM [id number]. Topic: General - Inquiry >> Sep 11, 2023  3:22 PM De Blanch wrote: Reason for CRM:Pt daughter called in and asked if it would be possible to reach out to her if pt did not show up for her upcoming appointment today. Stated pt told her she would come in on her own.  Please advise.

## 2023-09-11 NOTE — Telephone Encounter (Signed)
Pt here for appointment

## 2023-09-11 NOTE — Progress Notes (Addendum)
Date:  09/11/2023   Name:  Angelica Walker   DOB:  1941-03-30   MRN:  [id number]   Chief Complaint: Gait Problem (Approx 2 months- feels "wobbly and unsteady" Has to be "very careful not to fall")  Neurologic Problem The patient's primary symptoms include clumsiness, a loss of balance and memory loss. The patient's pertinent negatives include no altered mental status, focal sensory loss, focal weakness, near-syncope, slurred speech, syncope, visual change or weakness. This is a new problem. The current episode started more than 1 month ago (6 months). The problem has been gradually worsening since onset. There was no focality noted. Pertinent negatives include no bladder incontinence, chest pain, diaphoresis, dizziness, fatigue, headaches, light-headedness, nausea, palpitations, shortness of breath or vertigo. Past treatments include nothing. The treatment provided mild relief.    Lab Results  Component Value Date   NA 138 09/28/2022   K 4.3 09/28/2022   CO2 22 09/28/2022   GLUCOSE 118 (H) 09/28/2022   BUN 18 09/28/2022   CREATININE 0.85 09/28/2022   CALCIUM 10.2 09/28/2022   EGFR 70 09/28/2022   GFRNONAA 85 10/07/2020   Lab Results  Component Value Date   CHOL 217 (H) 03/21/2023   HDL 53 03/21/2023   LDLCALC 139 (H) 03/21/2023   TRIG 140 03/21/2023   CHOLHDL 3.5 12/19/2017   Lab Results  Component Value Date   TSH 1.320 01/30/2023   Lab Results  Component Value Date   HGBA1C 5.9 (H) 03/21/2023   Lab Results  Component Value Date   WBC 7.0 07/04/2023   HGB 12.5 07/04/2023   HCT 36.9 07/04/2023   MCV 90.4 07/04/2023   PLT 250 07/04/2023   Lab Results  Component Value Date   ALT 8 12/19/2017   AST 18 12/19/2017   ALKPHOS 87 12/19/2017   BILITOT 0.4 12/19/2017   No results found for: "25OHVITD2", "25OHVITD3", "VD25OH"   Review of Systems  Constitutional:  Negative for diaphoresis, fatigue and unexpected weight change.  HENT:  Negative for rhinorrhea and trouble  swallowing.   Respiratory:  Negative for cough, shortness of breath and wheezing.   Cardiovascular:  Negative for chest pain, palpitations and near-syncope.  Gastrointestinal:  Negative for constipation, diarrhea and nausea.  Genitourinary:  Negative for bladder incontinence.  Neurological:  Positive for loss of balance. Negative for dizziness, vertigo, tremors, focal weakness, seizures, syncope, weakness, light-headedness, numbness and headaches.  Hematological:  Negative for adenopathy. Does not bruise/bleed easily.  Psychiatric/Behavioral:  Positive for memory loss.     Patient Active Problem List   Diagnosis Date Noted   Iron deficiency anemia due to chronic blood loss 07/02/2018   Smoker 03/18/2018   RLS (restless legs syndrome) 01/18/2018   Anemia due to blood loss 12/20/2017   Prediabetes 12/19/2017   Hyperlipidemia, unspecified 12/19/2017   Hypertension, essential, benign 12/19/2017   B12 deficiency 12/19/2017   DDD (degenerative disc disease), lumbar 12/19/2017   Multiple gastric ulcers 12/19/2017   COPD (chronic obstructive pulmonary disease) (HCC) 12/21/2014    Allergies  Allergen Reactions   Simvastatin Other (See Comments)    Past Surgical History:  Procedure Laterality Date   CATARACT EXTRACTION W/PHACO Left 06/29/2020   Procedure: CATARACT EXTRACTION PHACO AND INTRAOCULAR LENS PLACEMENT (IOC) LEFT 7.20 00:42.7;  Surgeon: Nevada Crane, MD;  Location: [facility];  Service: Ophthalmology;  Laterality: Left;   CATARACT EXTRACTION W/PHACO Right 07/26/2020   Procedure: CATARACT EXTRACTION PHACO AND INTRAOCULAR LENS PLACEMENT (IOC) RIGHT 7.97  00:53.0;  Surgeon: Nevada Crane, MD;  Location: [facility];  Service: Ophthalmology;  Laterality: Right;   COLONOSCOPY WITH PROPOFOL N/A 12/07/2017   Procedure: COLONOSCOPY WITH PROPOFOL;  Surgeon: Christena Deem, MD;  Location: [facility] ENDOSCOPY;  Service: Endoscopy;  Laterality: N/A;    ESOPHAGOGASTRODUODENOSCOPY (EGD) WITH PROPOFOL N/A 12/07/2017   Procedure: ESOPHAGOGASTRODUODENOSCOPY (EGD) WITH PROPOFOL;  Surgeon: Christena Deem, MD;  Location: [facility] ENDOSCOPY;  Service: Endoscopy;  Laterality: N/A;   ESOPHAGOGASTRODUODENOSCOPY (EGD) WITH PROPOFOL N/A 03/21/2018   Procedure: ESOPHAGOGASTRODUODENOSCOPY (EGD) WITH PROPOFOL;  Surgeon: Christena Deem, MD;  Location: [facility] ENDOSCOPY;  Service: Endoscopy;  Laterality: N/A;   ESOPHAGOGASTRODUODENOSCOPY (EGD) WITH PROPOFOL N/A 07/22/2018   Procedure: ESOPHAGOGASTRODUODENOSCOPY (EGD) WITH PROPOFOL;  Surgeon: Christena Deem, MD;  Location: [facility] ENDOSCOPY;  Service: Endoscopy;  Laterality: N/A;    Social History   Tobacco Use   Smoking status: Every Day    Current packs/day: 1.00    Average packs/day: 1 pack/day for 54.0 years (54.0 ttl pk-yrs)    Types: Cigarettes   Smokeless tobacco: Never   Tobacco comments:    patches and pills discussed   Vaping Use   Vaping status: Never Used  Substance Use Topics   Alcohol use: No   Drug use: No     Medication list has been reviewed and updated.  Current Meds  Medication Sig   acetaminophen (TYLENOL) 500 MG tablet Take 1,000 mg by mouth 2 (two) times daily as needed.   Ascorbic Acid (VITAMIN C) 100 MG tablet Take 100 mg by mouth daily.   atorvastatin (LIPITOR) 10 MG tablet Take 1 tablet (10 mg total) by mouth daily.   Baclofen 5 MG TABS Take 1 tablet (5 mg total) by mouth 3 (three) times daily as needed.   Calcium Carbonate-Vitamin D 600-200 MG-UNIT TABS Take by mouth.   Cyanocobalamin (B-12) 1000 MCG TABS Take 1 tablet by mouth daily.   gabapentin (NEURONTIN) 100 MG capsule TAKE 1 CAPSULE BY MOUTH AT BEDTIME.   GLUCOSAMINE-CHONDROITIN PO Take 3 tablets by mouth.   lisinopril-hydrochlorothiazide (ZESTORETIC) 20-12.5 MG tablet TAKE ONE (1) TABLET BY MOUTH ONCE DAILY   MELATONIN PO Take by mouth at bedtime as needed.   pantoprazole (PROTONIX) 40 MG tablet TAKE (1) TABLET  BY MOUTH EVERY DAY   sertraline (ZOLOFT) 50 MG tablet Take 1 tablet (50 mg total) by mouth daily.   traZODone (DESYREL) 50 MG tablet Take 0.5-1 tablets (25-50 mg total) by mouth at bedtime as needed for sleep.   [DISCONTINUED] predniSONE (STERAPRED UNI-PAK 21 TAB) 10 MG (21) TBPK tablet Take 6 tablets on day 1, 5 tablets day 2, 4 tablets day 3, 3 tablets day 4, 2 tablets day 5, 1 tablet day 6       09/11/2023    4:05 PM 07/31/2023    1:13 PM 03/21/2023   11:05 AM 01/30/2023    3:54 PM  GAD 7 : Generalized Anxiety Score  Nervous, Anxious, on Edge 0 2 1 3   Control/stop worrying 0 0 1 3  Worry too much - different things 0 0 1 3  Trouble relaxing 0 0 0 3  Restless 0 0 0 3  Easily annoyed or irritable 0 1 0 0  Afraid - awful might happen 0 0 0 0  Total GAD 7 Score 0 3 3 15   Anxiety Difficulty Not difficult at all Not difficult at all Not difficult at all Somewhat difficult       09/11/2023    4:05  PM 09/05/2023    1:51 PM 07/31/2023    1:11 PM  Depression screen PHQ 2/9  Decreased Interest 0 0 0  Down, Depressed, Hopeless 0 0 0  PHQ - 2 Score 0 0 0  Altered sleeping 0 0 2  Tired, decreased energy 0 0 1  Change in appetite 0 0 0  Feeling bad or failure about yourself  0 0 0  Trouble concentrating 0 0 0  Moving slowly or fidgety/restless 0 0 0  Suicidal thoughts 0 0 0  PHQ-9 Score 0 0 3  Difficult doing work/chores Not difficult at all Not difficult at all Not difficult at all    BP Readings from Last 3 Encounters:  09/11/23 136/78  09/03/23 (!) 140/88  08/16/23 101/65    Physical Exam Vitals and nursing note reviewed. Exam conducted with a chaperone present.  Constitutional:      General: She is not in acute distress.    Appearance: She is not diaphoretic.  HENT:     Head: Normocephalic and atraumatic.     Right Ear: Tympanic membrane and external ear normal.     Left Ear: Tympanic membrane and external ear normal.     Nose: Nose normal. No congestion or rhinorrhea.      Mouth/Throat:     Mouth: Mucous membranes are moist.     Pharynx: No oropharyngeal exudate or posterior oropharyngeal erythema.  Eyes:     General:        Right eye: No discharge.        Left eye: No discharge.     Conjunctiva/sclera: Conjunctivae normal.     Pupils: Pupils are equal, round, and reactive to light.  Neck:     Thyroid: No thyromegaly.     Vascular: No JVD.  Cardiovascular:     Rate and Rhythm: Normal rate and regular rhythm.     Heart sounds: Normal heart sounds. No murmur heard.    No friction rub. No gallop.  Pulmonary:     Effort: Pulmonary effort is normal.     Breath sounds: Normal breath sounds. No wheezing, rhonchi or rales.  Chest:     Chest wall: No tenderness.  Abdominal:     General: Bowel sounds are normal.     Palpations: Abdomen is soft. There is no mass.     Tenderness: There is no abdominal tenderness. There is no guarding.  Musculoskeletal:        General: Normal range of motion.     Cervical back: Normal range of motion and neck supple.  Lymphadenopathy:     Cervical: No cervical adenopathy.  Skin:    General: Skin is warm and dry.  Neurological:     Mental Status: She is alert.     Wt Readings from Last 3 Encounters:  09/11/23 113 lb (51.3 kg)  08/16/23 112 lb (50.8 kg)  07/31/23 117 lb (53.1 kg)    BP 136/78   Pulse (!) 104   Ht 5\' 3"  (1.6 m)   Wt 113 lb (51.3 kg)   SpO2 98%   BMI 20.02 kg/m   Assessment and Plan:  1. Ataxia New onset.  Persistent.  Stable.  Patient relates that it is really not dizziness but that she has difficulty with her balance and that she has multiple areas in the house that she is able to grab onto to maintain her balance.  Will refer to neurology for evaluation. - Ambulatory referral to Neurology  2. Memory change Patient  is also noted that she has had increasing difficulty with her recent memory.  There has been no focal changes in her neurologic exam.  We will refer to neurology for evaluation of  memory changes well. - Ambulatory referral to Neurology  3. Smoker Patient has been advised of the health risks of smoking and counseled concerning cessation of tobacco products. I spent over 3 minutes for discussion and to answer questions.   4.  Cognitive changes.  Patient is also noted that she used to pay the bills in fact that was her job at [employer] but she is unable to pay her own bills and having to rely upon her daughter to do her calculations and making sure the bills are paid on time.  Elizabeth Sauer, MD

## 2023-09-20 DIAGNOSIS — Z961 Presence of intraocular lens: Secondary | ICD-10-CM | POA: Diagnosis not present

## 2023-09-25 ENCOUNTER — Other Ambulatory Visit: Payer: Self-pay | Admitting: Family Medicine

## 2023-09-25 DIAGNOSIS — G2581 Restless legs syndrome: Secondary | ICD-10-CM

## 2023-09-25 DIAGNOSIS — I10 Essential (primary) hypertension: Secondary | ICD-10-CM

## 2023-09-25 NOTE — Telephone Encounter (Signed)
Requested Prescriptions  Pending Prescriptions Disp Refills   lisinopril-hydrochlorothiazide (ZESTORETIC) 20-12.5 MG tablet [Pharmacy Med Name: LISINOP/HCTZ TAB 20-12.5] 90 tablet 0    Sig: TAKE (1) TABLET BY MOUTH EVERY DAY     Cardiovascular:  ACEI + Diuretic Combos Failed - 09/25/2023 10:54 AM      Failed - Na in normal range and within 180 days    Sodium  Date Value Ref Range Status  09/28/2022 138 134 - 144 mmol/L Final  08/26/2012 139 136 - 145 mmol/L Final         Failed - K in normal range and within 180 days    Potassium  Date Value Ref Range Status  09/28/2022 4.3 3.5 - 5.2 mmol/L Final  08/26/2012 3.3 (L) 3.5 - 5.1 mmol/L Final         Failed - Cr in normal range and within 180 days    Creatinine, Ser  Date Value Ref Range Status  09/28/2022 0.85 0.57 - 1.00 mg/dL Final         Failed - eGFR is 30 or above and within 180 days    GFR calc Af Amer  Date Value Ref Range Status  10/07/2020 98 >59 mL/min/1.73 Final    Comment:    **In accordance with recommendations from the NKF-ASN Task force,**   [facility] is in the process of updating its eGFR calculation to the   2021 CKD-EPI creatinine equation that estimates kidney function   without a race variable.    GFR calc non Af Amer  Date Value Ref Range Status  10/07/2020 85 >59 mL/min/1.73 Final   eGFR  Date Value Ref Range Status  09/28/2022 70 >59 mL/min/1.73 Final         Passed - Patient is not pregnant      Passed - Last BP in normal range    BP Readings from Last 1 Encounters:  09/11/23 136/78         Passed - Valid encounter within last 6 months    Recent Outpatient Visits           2 weeks ago Ataxia   [place] [facility]   1 month ago Anxiety and depression   [place] [facility]   6 months ago Hypertension, essential, benign   [place] [facility]   7 months ago Fatigue, unspecified type   [facility]   12 months ago Hypertension, essential, benign    [facility]               gabapentin (NEURONTIN) 100 MG capsule [Pharmacy Med Name: GABAPENTIN CAP 100MG ] 90 capsule 0    Sig: TAKE 1 CAPSULE BY MOUTH AT BEDTIME.     Neurology: Anticonvulsants - gabapentin Failed - 09/25/2023 10:54 AM      Failed - Cr in normal range and within 360 days    Creatinine, Ser  Date Value Ref Range Status  09/28/2022 0.85 0.57 - 1.00 mg/dL Final         Passed - Completed PHQ-2 or PHQ-9 in the last 360 days      Passed - Valid encounter within last 12 months    Recent  Outpatient Visits           2 weeks ago Ataxia   [place] [facility]   1 month ago Anxiety and depression   [place] [facility]   6 months ago Hypertension, essential, benign   [place] [facility]   7 months ago Fatigue, unspecified type   [facility]   12 months ago Hypertension, essential, benign   [facility]

## 2023-10-11 DIAGNOSIS — R2689 Other abnormalities of gait and mobility: Secondary | ICD-10-CM | POA: Diagnosis not present

## 2023-10-11 DIAGNOSIS — I10 Essential (primary) hypertension: Secondary | ICD-10-CM | POA: Diagnosis not present

## 2023-10-11 DIAGNOSIS — G479 Sleep disorder, unspecified: Secondary | ICD-10-CM | POA: Diagnosis not present

## 2023-10-11 DIAGNOSIS — G3184 Mild cognitive impairment, so stated: Secondary | ICD-10-CM | POA: Diagnosis not present

## 2023-10-29 ENCOUNTER — Encounter: Payer: Self-pay | Admitting: Family Medicine

## 2023-10-29 ENCOUNTER — Ambulatory Visit (INDEPENDENT_AMBULATORY_CARE_PROVIDER_SITE_OTHER): Payer: Medicare Other | Admitting: Family Medicine

## 2023-10-29 VITALS — BP 110/76 | HR 111 | Ht 63.0 in | Wt 110.0 lb

## 2023-10-29 DIAGNOSIS — R7303 Prediabetes: Secondary | ICD-10-CM | POA: Diagnosis not present

## 2023-10-29 DIAGNOSIS — K259 Gastric ulcer, unspecified as acute or chronic, without hemorrhage or perforation: Secondary | ICD-10-CM | POA: Diagnosis not present

## 2023-10-29 DIAGNOSIS — I10 Essential (primary) hypertension: Secondary | ICD-10-CM

## 2023-10-29 DIAGNOSIS — F5101 Primary insomnia: Secondary | ICD-10-CM

## 2023-10-29 DIAGNOSIS — G2581 Restless legs syndrome: Secondary | ICD-10-CM

## 2023-10-29 DIAGNOSIS — F32A Depression, unspecified: Secondary | ICD-10-CM

## 2023-10-29 DIAGNOSIS — F419 Anxiety disorder, unspecified: Secondary | ICD-10-CM

## 2023-10-29 MED ORDER — SERTRALINE HCL 50 MG PO TABS
50.0000 mg | ORAL_TABLET | Freq: Every day | ORAL | 1 refills | Status: DC
Start: 2023-10-29 — End: 2024-03-13

## 2023-10-29 MED ORDER — TRAZODONE HCL 50 MG PO TABS: 25.0000 mg | ORAL_TABLET | Freq: Every evening | ORAL | 1 refills | Status: DC | PRN

## 2023-10-29 MED ORDER — LISINOPRIL-HYDROCHLOROTHIAZIDE 20-12.5 MG PO TABS
ORAL_TABLET | ORAL | 1 refills | Status: DC
Start: 2023-10-29 — End: 2024-02-28

## 2023-10-29 MED ORDER — PANTOPRAZOLE SODIUM 40 MG PO TBEC
DELAYED_RELEASE_TABLET | ORAL | 1 refills | Status: DC
Start: 2023-10-29 — End: 2024-02-28

## 2023-10-29 MED ORDER — GABAPENTIN 100 MG PO CAPS
ORAL_CAPSULE | ORAL | 1 refills | Status: DC
Start: 2023-10-29 — End: 2024-02-28

## 2023-10-29 NOTE — Progress Notes (Signed)
Date:  10/29/2023   Name:  Angelica Walker   DOB:  [birth year]   MRN:  [id number]   Chief Complaint: Hypertension  Hypertension This is a chronic problem. The current episode started more than 1 year ago. The problem has been gradually worsening since onset. The problem is controlled. Associated symptoms include anxiety. Pertinent negatives include no blurred vision, chest pain, headaches, malaise/fatigue, neck pain, orthopnea, palpitations, peripheral edema, PND, shortness of breath or sweats. There are no associated agents to hypertension. Risk factors for coronary artery disease include dyslipidemia.  Gastroesophageal Reflux She reports no chest pain, no coughing, no nausea, no sore throat or no wheezing. The current episode started more than 1 year ago. The problem occurs frequently. The problem has been gradually improving. The symptoms are aggravated by certain foods. Pertinent negatives include no fatigue, muscle weakness, orthopnea or weight loss. She has tried a PPI for the symptoms. Past procedures include H. pylori antibody titer.  Anxiety Presents for follow-up visit. Symptoms include insomnia. Patient reports no chest pain, compulsions, confusion, decreased concentration, depressed mood, dizziness, dry mouth, excessive worry, feeling of choking, hyperventilation, impotence, irritability, malaise, muscle tension, nausea, nervous/anxious behavior, obsessions, palpitations, panic, restlessness, shortness of breath or suicidal ideas.    Depression        Associated symptoms include insomnia.  Associated symptoms include no decreased concentration, no fatigue, no helplessness, no hopelessness, not irritable, no restlessness, no appetite change, no headaches, not sad and no suicidal ideas.  Past treatments include SSRIs - Selective serotonin reuptake inhibitors.  Past medical history includes anxiety.   Insomnia Primary symptoms: no malaise/fatigue.   The current episode started more than  one year. The problem has been waxing and waning since onset. PMH includes: depression.     Lab Results  Component Value Date   NA 138 09/28/2022   K 4.3 09/28/2022   CO2 22 09/28/2022   GLUCOSE 118 (H) 09/28/2022   BUN 18 09/28/2022   CREATININE 0.85 09/28/2022   CALCIUM 10.2 09/28/2022   EGFR 70 09/28/2022   GFRNONAA 85 10/07/2020   Lab Results  Component Value Date   CHOL 217 (H) 03/21/2023   HDL 53 03/21/2023   LDLCALC 139 (H) 03/21/2023   TRIG 140 03/21/2023   CHOLHDL 3.5 12/19/2017   Lab Results  Component Value Date   TSH 1.320 01/30/2023   Lab Results  Component Value Date   HGBA1C 5.9 (H) 03/21/2023   Lab Results  Component Value Date   WBC 7.0 07/04/2023   HGB 12.5 07/04/2023   HCT 36.9 07/04/2023   MCV 90.4 07/04/2023   PLT 250 07/04/2023   Lab Results  Component Value Date   ALT 8 12/19/2017   AST 18 12/19/2017   ALKPHOS 87 12/19/2017   BILITOT 0.4 12/19/2017   No results found for: "25OHVITD2", "25OHVITD3", "VD25OH"   Review of Systems  Constitutional:  Negative for appetite change, fatigue, irritability, malaise/fatigue and weight loss.  HENT:  Negative for sore throat.   Eyes:  Negative for blurred vision.  Respiratory:  Negative for cough, shortness of breath and wheezing.   Cardiovascular:  Negative for chest pain, palpitations, orthopnea, leg swelling and PND.  Gastrointestinal:  Negative for abdominal distention and nausea.  Endocrine: Negative for polydipsia and polyuria.  Genitourinary:  Negative for difficulty urinating and impotence.  Musculoskeletal:  Negative for muscle weakness and neck pain.  Neurological:  Negative for dizziness and headaches.  Psychiatric/Behavioral:  Positive for depression. Negative for confusion,  decreased concentration and suicidal ideas. The patient has insomnia. The patient is not nervous/anxious.     Patient Active Problem List   Diagnosis Date Noted   Iron deficiency anemia due to chronic blood loss  07/02/2018   Smoker 03/18/2018   RLS (restless legs syndrome) 01/18/2018   Anemia due to blood loss 12/20/2017   Prediabetes 12/19/2017   Hyperlipidemia, unspecified 12/19/2017   Hypertension, essential, benign 12/19/2017   B12 deficiency 12/19/2017   DDD (degenerative disc disease), lumbar 12/19/2017   Multiple gastric ulcers 12/19/2017   COPD (chronic obstructive pulmonary disease) (HCC) 12/21/2014    Allergies  Allergen Reactions   Simvastatin Other (See Comments)    Past Surgical History:  Procedure Laterality Date   CATARACT EXTRACTION W/PHACO Left 06/29/2020   Procedure: CATARACT EXTRACTION PHACO AND INTRAOCULAR LENS PLACEMENT (IOC) LEFT 7.20 00:42.7;  Surgeon: Nevada Crane, MD;  Location: [facility];  Service: Ophthalmology;  Laterality: Left;   CATARACT EXTRACTION W/PHACO Right 07/26/2020   Procedure: CATARACT EXTRACTION PHACO AND INTRAOCULAR LENS PLACEMENT (IOC) RIGHT 7.97  00:53.0;  Surgeon: Nevada Crane, MD;  Location: [facility];  Service: Ophthalmology;  Laterality: Right;   COLONOSCOPY WITH PROPOFOL N/A 12/07/2017   Procedure: COLONOSCOPY WITH PROPOFOL;  Surgeon: Christena Deem, MD;  Location: [facility] ENDOSCOPY;  Service: Endoscopy;  Laterality: N/A;   ESOPHAGOGASTRODUODENOSCOPY (EGD) WITH PROPOFOL N/A 12/07/2017   Procedure: ESOPHAGOGASTRODUODENOSCOPY (EGD) WITH PROPOFOL;  Surgeon: Christena Deem, MD;  Location: [facility] ENDOSCOPY;  Service: Endoscopy;  Laterality: N/A;   ESOPHAGOGASTRODUODENOSCOPY (EGD) WITH PROPOFOL N/A 03/21/2018   Procedure: ESOPHAGOGASTRODUODENOSCOPY (EGD) WITH PROPOFOL;  Surgeon: Christena Deem, MD;  Location: [facility] ENDOSCOPY;  Service: Endoscopy;  Laterality: N/A;   ESOPHAGOGASTRODUODENOSCOPY (EGD) WITH PROPOFOL N/A 07/22/2018   Procedure: ESOPHAGOGASTRODUODENOSCOPY (EGD) WITH PROPOFOL;  Surgeon: Christena Deem, MD;  Location: [facility] ENDOSCOPY;  Service: Endoscopy;  Laterality: N/A;    Social History   Tobacco Use    Smoking status: Every Day    Current packs/day: 1.00    Average packs/day: 1 pack/day for 54.0 years (54.0 ttl pk-yrs)    Types: Cigarettes   Smokeless tobacco: Never   Tobacco comments:    patches and pills discussed   Vaping Use   Vaping status: Never Used  Substance Use Topics   Alcohol use: No   Drug use: No     Medication list has been reviewed and updated.  Current Meds  Medication Sig   acetaminophen (TYLENOL) 500 MG tablet Take 1,000 mg by mouth 2 (two) times daily as needed.   Ascorbic Acid (VITAMIN C) 100 MG tablet Take 100 mg by mouth daily.   atorvastatin (LIPITOR) 10 MG tablet Take 1 tablet (10 mg total) by mouth daily.   Baclofen 5 MG TABS Take 1 tablet (5 mg total) by mouth 3 (three) times daily as needed.   Calcium Carbonate-Vitamin D 600-200 MG-UNIT TABS Take by mouth.   Cyanocobalamin (B-12) 1000 MCG TABS Take 1 tablet by mouth daily.   donepezil (ARICEPT) 5 MG tablet Take 5 mg by mouth at bedtime.   gabapentin (NEURONTIN) 100 MG capsule TAKE 1 CAPSULE BY MOUTH AT BEDTIME.   GLUCOSAMINE-CHONDROITIN PO Take 3 tablets by mouth.   lisinopril-hydrochlorothiazide (ZESTORETIC) 20-12.5 MG tablet TAKE (1) TABLET BY MOUTH EVERY DAY   MELATONIN PO Take by mouth at bedtime as needed.   pantoprazole (PROTONIX) 40 MG tablet TAKE (1) TABLET BY MOUTH EVERY DAY   sertraline (ZOLOFT) 50 MG tablet Take 1 tablet (50  mg total) by mouth daily.   traZODone (DESYREL) 50 MG tablet Take 0.5-1 tablets (25-50 mg total) by mouth at bedtime as needed for sleep.       09/11/2023    4:05 PM 07/31/2023    1:13 PM 03/21/2023   11:05 AM 01/30/2023    3:54 PM  GAD 7 : Generalized Anxiety Score  Nervous, Anxious, on Edge 0 2 1 3   Control/stop worrying 0 0 1 3  Worry too much - different things 0 0 1 3  Trouble relaxing 0 0 0 3  Restless 0 0 0 3  Easily annoyed or irritable 0 1 0 0  Afraid - awful might happen 0 0 0 0  Total GAD 7 Score 0 3 3 15   Anxiety Difficulty Not difficult at all  Not difficult at all Not difficult at all Somewhat difficult       09/11/2023    4:05 PM 09/05/2023    1:51 PM 07/31/2023    1:11 PM  Depression screen PHQ 2/9  Decreased Interest 0 0 0  Down, Depressed, Hopeless 0 0 0  PHQ - 2 Score 0 0 0  Altered sleeping 0 0 2  Tired, decreased energy 0 0 1  Change in appetite 0 0 0  Feeling bad or failure about yourself  0 0 0  Trouble concentrating 0 0 0  Moving slowly or fidgety/restless 0 0 0  Suicidal thoughts 0 0 0  PHQ-9 Score 0 0 3  Difficult doing work/chores Not difficult at all Not difficult at all Not difficult at all    BP Readings from Last 3 Encounters:  10/29/23 (!) 186/80  09/11/23 136/78  09/03/23 (!) 140/88    Physical Exam Vitals and nursing note reviewed. Exam conducted with a chaperone present.  Constitutional:      General: She is not irritable.She is not in acute distress.    Appearance: She is well-developed. She is not diaphoretic.  HENT:     Head: Normocephalic and atraumatic.     Right Ear: Tympanic membrane and external ear normal.     Left Ear: Tympanic membrane and external ear normal.     Nose: Nose normal.     Mouth/Throat:     Mouth: Mucous membranes are moist.  Eyes:     General: Lids are everted, no foreign bodies appreciated. No scleral icterus.       Right eye: No discharge.        Left eye: No foreign body, discharge or hordeolum.     Conjunctiva/sclera: Conjunctivae normal.     Right eye: Right conjunctiva is not injected.     Left eye: Left conjunctiva is not injected.     Pupils: Pupils are equal, round, and reactive to light.  Neck:     Thyroid: No thyromegaly.     Vascular: No JVD.     Trachea: No tracheal deviation.  Cardiovascular:     Rate and Rhythm: Normal rate and regular rhythm.     Heart sounds: Normal heart sounds, S1 normal and S2 normal. No murmur heard.    No systolic murmur is present.     No diastolic murmur is present.     No friction rub. No gallop. No S3 or S4  sounds.  Pulmonary:     Effort: Pulmonary effort is normal. No respiratory distress.     Breath sounds: Normal breath sounds. No wheezing, rhonchi or rales.  Abdominal:     General: Bowel sounds are normal.  Palpations: Abdomen is soft. There is no mass.     Tenderness: There is no abdominal tenderness. There is no guarding or rebound.  Musculoskeletal:        General: No tenderness. Normal range of motion.     Cervical back: Normal range of motion and neck supple.  Lymphadenopathy:     Cervical: No cervical adenopathy.  Skin:    General: Skin is warm and dry.     Findings: No rash.  Neurological:     Mental Status: She is alert and oriented to person, place, and time.     Cranial Nerves: No cranial nerve deficit.     Deep Tendon Reflexes: Reflexes are normal and symmetric. Reflexes normal.  Psychiatric:        Mood and Affect: Mood is not anxious or depressed.     Wt Readings from Last 3 Encounters:  10/29/23 110 lb (49.9 kg)  09/11/23 113 lb (51.3 kg)  08/16/23 112 lb (50.8 kg)    BP (!) 186/80   Pulse (!) 111   Ht 5\' 3"  (1.6 m)   Wt 110 lb (49.9 kg)   SpO2 98%   BMI 19.49 kg/m   Assessment and Plan:  1. Hypertension, essential, benign Chronic.  Controlled.  Stable.  Blood pressure today is 110/76.  Continue lisinopril hydrochlorothiazide 20-12.5 mg once a day.  Will check CMP for electrolytes and GFR. - lisinopril-hydrochlorothiazide (ZESTORETIC) 20-12.5 MG tablet; TAKE (1) TABLET BY MOUTH EVERY DAY  Dispense: 90 tablet; Refill: 1 - Comprehensive metabolic panel  2. RLS (restless legs syndrome) Chronic.  Controlled.  Stable.  Continue gabapentin 100 mg nightly. - gabapentin (NEURONTIN) 100 MG capsule; TAKE 1 CAPSULE BY MOUTH AT BEDTIME.  Dispense: 90 capsule; Refill: 1  3. Multiple gastric ulcers Chronic.  Controlled.  Stable.  Continue pantoprazole 40 mg once a day. - pantoprazole (PROTONIX) 40 MG tablet; TAKE (1) TABLET BY MOUTH EVERY DAY  Dispense: 90  tablet; Refill: 1  4. Anxiety and depression Chronic.  Controlled.  Stable.  PHQ is 0 GAD score 0 will continue with current dosing of sertraline. - sertraline (ZOLOFT) 50 MG tablet; Take 1 tablet (50 mg total) by mouth daily.  Dispense: 90 tablet; Refill: 1  5. Primary insomnia Chronic.  Controlled.  Stable.  Continue trazodone 50 mg 1/2-1 nightly to initiate sleep. - traZODone (DESYREL) 50 MG tablet; Take 0.5-1 tablets (25-50 mg total) by mouth at bedtime as needed for sleep.  Dispense: 90 tablet; Refill: 1  6. Prediabetes Chronic.  Controlled.  Stable.  Patient is currently controlled with dietary discretion.  Will check A1c for current level of control.  Patient has been encouraged to return to clinic as needed if sooner concerns or being expressed and otherwise we will recheck in 6 months. - Hemoglobin A1c    Elizabeth Sauer, MD

## 2023-10-30 ENCOUNTER — Encounter: Payer: Self-pay | Admitting: Family Medicine

## 2023-10-30 LAB — COMPREHENSIVE METABOLIC PANEL
ALT: 7 [IU]/L (ref 0–32)
AST: 13 [IU]/L (ref 0–40)
Albumin: 4.2 g/dL (ref 3.8–4.8)
Alkaline Phosphatase: 70 [IU]/L (ref 44–121)
BUN/Creatinine Ratio: 18 (ref 12–28)
BUN: 13 mg/dL (ref 8–27)
Bilirubin Total: 0.2 mg/dL (ref 0.0–1.2)
CO2: 27 mmol/L (ref 20–29)
Calcium: 10.6 mg/dL — ABNORMAL HIGH (ref 8.7–10.3)
Chloride: 101 mmol/L (ref 96–106)
Creatinine, Ser: 0.74 mg/dL (ref 0.57–1.00)
Globulin, Total: 2.4 g/dL (ref 1.5–4.5)
Glucose: 95 mg/dL (ref 70–99)
Potassium: 4.1 mmol/L (ref 3.5–5.2)
Sodium: 141 mmol/L (ref 134–144)
Total Protein: 6.6 g/dL (ref 6.0–8.5)
eGFR: 82 mL/min/{1.73_m2} (ref >59)

## 2023-10-30 LAB — HEMOGLOBIN A1C
Est. average glucose Bld gHb Est-mCnc: 120 mg/dL
Hgb A1c MFr Bld: 5.8 % — ABNORMAL HIGH (ref 4.8–5.6)

## 2023-11-05 DIAGNOSIS — R194 Change in bowel habit: Secondary | ICD-10-CM | POA: Diagnosis not present

## 2023-11-30 ENCOUNTER — Ambulatory Visit: Payer: Medicare Other | Admitting: Oncology

## 2023-11-30 ENCOUNTER — Other Ambulatory Visit: Payer: Medicare Other

## 2024-01-01 ENCOUNTER — Telehealth: Payer: Self-pay | Admitting: Family Medicine

## 2024-01-01 ENCOUNTER — Other Ambulatory Visit: Payer: Self-pay

## 2024-01-01 DIAGNOSIS — D509 Iron deficiency anemia, unspecified: Secondary | ICD-10-CM

## 2024-01-01 NOTE — Telephone Encounter (Signed)
 Copied from CRM [id number]. Topic: Appointment Scheduling - Scheduling Inquiry for Clinic >> Jan 01, 2024 12:20 PM Victoria B wrote: Reason for CRM: pt called in asking if she can get a location in [facility] for her lab appt with Oncology instead of [place]. Her appt is tomorrow at the [facility] location

## 2024-01-01 NOTE — Telephone Encounter (Signed)
 Called pt let her know that she would need to go to the [place] location. Told pt she has an appt tomorrow 01/02/24 in [place] at 2:00 PM. Pt verbalized understanding.

## 2024-01-02 ENCOUNTER — Inpatient Hospital Stay: Payer: Medicare Other | Attending: Oncology

## 2024-02-26 ENCOUNTER — Ambulatory Visit: Payer: Medicare Other | Admitting: Family Medicine

## 2024-02-28 ENCOUNTER — Encounter: Payer: Self-pay | Admitting: Family Medicine

## 2024-02-28 ENCOUNTER — Ambulatory Visit (INDEPENDENT_AMBULATORY_CARE_PROVIDER_SITE_OTHER): Admitting: Family Medicine

## 2024-02-28 VITALS — BP 126/72 | HR 85 | Ht 63.0 in | Wt 113.8 lb

## 2024-02-28 DIAGNOSIS — F32A Depression, unspecified: Secondary | ICD-10-CM

## 2024-02-28 DIAGNOSIS — E785 Hyperlipidemia, unspecified: Secondary | ICD-10-CM

## 2024-02-28 DIAGNOSIS — I10 Essential (primary) hypertension: Secondary | ICD-10-CM

## 2024-02-28 DIAGNOSIS — F419 Anxiety disorder, unspecified: Secondary | ICD-10-CM

## 2024-02-28 MED ORDER — ATORVASTATIN CALCIUM 10 MG PO TABS
10.0000 mg | ORAL_TABLET | Freq: Every day | ORAL | 1 refills | Status: DC
Start: 2024-02-28 — End: 2024-03-13

## 2024-02-28 MED ORDER — LISINOPRIL-HYDROCHLOROTHIAZIDE 20-12.5 MG PO TABS
ORAL_TABLET | ORAL | 1 refills | Status: DC
Start: 2024-02-28 — End: 2024-03-13

## 2024-02-28 NOTE — Progress Notes (Signed)
 Date:  02/28/2024   Name:  Angelica Walker   DOB:  [birth year]   MRN:  [id number]   Chief Complaint: Medical Management of Chronic Issues (Patient presents today for a follow up on her HTN and dementia. She is not taking her medications as directed. She is unsure exactly why she is here today. )  Hypertension This is a chronic problem. The current episode started more than 1 year ago. The problem has been gradually improving since onset. The problem is controlled. Associated symptoms include anxiety. Pertinent negatives include no blurred vision, chest pain, headaches, malaise/fatigue, neck pain, orthopnea, palpitations, peripheral edema, PND, shortness of breath or sweats. There are no associated agents to hypertension. Risk factors for coronary artery disease include dyslipidemia. Past treatments include ACE inhibitors and diuretics. The current treatment provides moderate improvement. There are no compliance problems.  There is no history of CAD/MI or CVA. There is no history of chronic renal disease, a hypertension causing med or renovascular disease.  Hyperlipidemia This is a chronic problem. The current episode started more than 1 year ago. The problem is controlled. She has no history of chronic renal disease. Pertinent negatives include no chest pain or shortness of breath. The current treatment provides mild improvement of lipids. There are no compliance problems.  There are no known risk factors for coronary artery disease.  Anxiety Presents for follow-up visit. Symptoms include excessive worry, nausea and nervous/anxious behavior. Patient reports no chest pain, dizziness, insomnia, palpitations, panic or shortness of breath.      Lab Results  Component Value Date   NA 141 10/29/2023   K 4.1 10/29/2023   CO2 27 10/29/2023   GLUCOSE 95 10/29/2023   BUN 13 10/29/2023   CREATININE 0.74 10/29/2023   CALCIUM 10.6 (H) 10/29/2023   EGFR 82 10/29/2023   GFRNONAA 85 10/07/2020   Lab  Results  Component Value Date   CHOL 217 (H) 03/21/2023   HDL 53 03/21/2023   LDLCALC 139 (H) 03/21/2023   TRIG 140 03/21/2023   CHOLHDL 3.5 12/19/2017   Lab Results  Component Value Date   TSH 1.320 01/30/2023   Lab Results  Component Value Date   HGBA1C 5.8 (H) 10/29/2023   Lab Results  Component Value Date   WBC 7.0 07/04/2023   HGB 12.5 07/04/2023   HCT 36.9 07/04/2023   MCV 90.4 07/04/2023   PLT 250 07/04/2023   Lab Results  Component Value Date   ALT 7 10/29/2023   AST 13 10/29/2023   ALKPHOS 70 10/29/2023   BILITOT 0.2 10/29/2023   No results found for: "25OHVITD2", "25OHVITD3", "VD25OH"   Review of Systems  Constitutional:  Negative for chills, fever and malaise/fatigue.  Eyes:  Negative for blurred vision.  Respiratory:  Negative for chest tightness, shortness of breath and wheezing.   Cardiovascular:  Negative for chest pain, palpitations, orthopnea and PND.  Gastrointestinal:  Positive for nausea. Negative for abdominal pain, constipation and diarrhea.  Genitourinary:  Negative for difficulty urinating and genital sores.  Musculoskeletal:  Negative for neck pain.  Neurological:  Negative for dizziness, speech difficulty, weakness, numbness and headaches.  Psychiatric/Behavioral:  The patient is nervous/anxious. The patient does not have insomnia.     Patient Active Problem List   Diagnosis Date Noted   Iron deficiency anemia due to chronic blood loss 07/02/2018   Smoker 03/18/2018   RLS (restless legs syndrome) 01/18/2018   Anemia due to blood loss 12/20/2017   Prediabetes 12/19/2017  Hyperlipidemia, unspecified 12/19/2017   Hypertension, essential, benign 12/19/2017   B12 deficiency 12/19/2017   DDD (degenerative disc disease), lumbar 12/19/2017   Multiple gastric ulcers 12/19/2017   COPD (chronic obstructive pulmonary disease) (HCC) 12/21/2014    Allergies  Allergen Reactions   Simvastatin Other (See Comments)    Past Surgical History:   Procedure Laterality Date   CATARACT EXTRACTION W/PHACO Left 06/29/2020   Procedure: CATARACT EXTRACTION PHACO AND INTRAOCULAR LENS PLACEMENT (IOC) LEFT 7.20 00:42.7;  Surgeon: Nevada Crane, MD;  Location: [facility];  Service: Ophthalmology;  Laterality: Left;   CATARACT EXTRACTION W/PHACO Right 07/26/2020   Procedure: CATARACT EXTRACTION PHACO AND INTRAOCULAR LENS PLACEMENT (IOC) RIGHT 7.97  00:53.0;  Surgeon: Nevada Crane, MD;  Location: [facility];  Service: Ophthalmology;  Laterality: Right;   COLONOSCOPY WITH PROPOFOL N/A 12/07/2017   Procedure: COLONOSCOPY WITH PROPOFOL;  Surgeon: Christena Deem, MD;  Location: [facility] ENDOSCOPY;  Service: Endoscopy;  Laterality: N/A;   ESOPHAGOGASTRODUODENOSCOPY (EGD) WITH PROPOFOL N/A 12/07/2017   Procedure: ESOPHAGOGASTRODUODENOSCOPY (EGD) WITH PROPOFOL;  Surgeon: Christena Deem, MD;  Location: [facility] ENDOSCOPY;  Service: Endoscopy;  Laterality: N/A;   ESOPHAGOGASTRODUODENOSCOPY (EGD) WITH PROPOFOL N/A 03/21/2018   Procedure: ESOPHAGOGASTRODUODENOSCOPY (EGD) WITH PROPOFOL;  Surgeon: Christena Deem, MD;  Location: [facility] END[facility];  Service: Endoscopy;  Laterality: N/A;   ESOPHAGOGASTRODUODENOSCOPY (EGD) WITH PROPOFOL N/A 07/22/2018   Procedure: ESOPHAGOGASTRODUODENOSCOPY (EGD) WITH PROPOFOL;  Surgeon: Christena Deem, MD;  Location: [facility] ENDOSCOPY;  Service: Endoscopy;  Laterality: N/A;    Social History   Tobacco Use   Smoking status: Every Day    Current packs/day: 1.00    Average packs/day: 1 pack/day for 54.0 years (54.0 ttl pk-yrs)    Types: Cigarettes   Smokeless tobacco: Never   Tobacco comments:    patches and pills discussed   Vaping Use   Vaping status: Never Used  Substance Use Topics   Alcohol use: No   Drug use: No     Medication list has been reviewed and updated.  Current Meds  Medication Sig   acetaminophen (TYLENOL) 500 MG tablet Take 1,000 mg by mouth 2 (two) times daily as needed.    Ascorbic Acid (VITAMIN C) 100 MG tablet Take 100 mg by mouth daily.   Calcium Carbonate-Vitamin D 600-200 MG-UNIT TABS Take by mouth.   gabapentin (NEURONTIN) 100 MG capsule TAKE 1 CAPSULE BY MOUTH AT BEDTIME.   GLUCOSAMINE-CHONDROITIN PO Take 3 tablets by mouth.   lisinopril-hydrochlorothiazide (ZESTORETIC) 20-12.5 MG tablet TAKE (1) TABLET BY MOUTH EVERY DAY   sertraline (ZOLOFT) 50 MG tablet Take 1 tablet (50 mg total) by mouth daily.       02/28/2024    2:08 PM 09/11/2023    4:05 PM 07/31/2023    1:13 PM 03/21/2023   11:05 AM  GAD 7 : Generalized Anxiety Score  Nervous, Anxious, on Edge 2 0 2 1  Control/stop worrying 2 0 0 1  Worry too much - different things 2 0 0 1  Trouble relaxing 0 0 0 0  Restless 0 0 0 0  Easily annoyed or irritable 0 0 1 0  Afraid - awful might happen 0 0 0 0  Total GAD 7 Score 6 0 3 3  Anxiety Difficulty Not difficult at all Not difficult at all Not difficult at all Not difficult at all       02/28/2024    2:08 PM 09/11/2023    4:05 PM 09/05/2023    1:51  PM  Depression screen PHQ 2/9  Decreased Interest 0 0 0  Down, Depressed, Hopeless 0 0 0  PHQ - 2 Score 0 0 0  Altered sleeping 0 0 0  Tired, decreased energy 2 0 0  Change in appetite 0 0 0  Feeling bad or failure about yourself  0 0 0  Trouble concentrating 3 0 0  Moving slowly or fidgety/restless 0 0 0  Suicidal thoughts 0 0 0  PHQ-9 Score 5 0 0  Difficult doing work/chores Not difficult at all Not difficult at all Not difficult at all    BP Readings from Last 3 Encounters:  02/28/24 126/72  10/29/23 110/76  09/11/23 136/78    Physical Exam Vitals and nursing note reviewed.  Constitutional:      General: She is not in acute distress.    Appearance: She is not diaphoretic.  HENT:     Head: Normocephalic and atraumatic.     Right Ear: External ear normal.     Left Ear: External ear normal.     Nose: Nose normal.  Eyes:     General:        Right eye: No discharge.        Left eye:  No discharge.     Conjunctiva/sclera: Conjunctivae normal.     Pupils: Pupils are equal, round, and reactive to light.  Neck:     Thyroid: No thyromegaly.     Vascular: No JVD.  Cardiovascular:     Rate and Rhythm: Normal rate and regular rhythm.     Heart sounds: Normal heart sounds. No murmur heard.    No friction rub. No gallop.  Pulmonary:     Effort: Pulmonary effort is normal.     Breath sounds: Normal breath sounds. No wheezing, rhonchi or rales.  Abdominal:     General: Bowel sounds are normal.     Palpations: Abdomen is soft. There is no mass.     Tenderness: There is no abdominal tenderness. There is no guarding.  Musculoskeletal:        General: Normal range of motion.     Cervical back: Normal range of motion and neck supple.  Lymphadenopathy:     Cervical: No cervical adenopathy.  Skin:    General: Skin is warm and dry.  Neurological:     Mental Status: She is alert.     Deep Tendon Reflexes: Reflexes are normal and symmetric.     Wt Readings from Last 3 Encounters:  02/28/24 113 lb 12.8 oz (51.6 kg)  10/29/23 110 lb (49.9 kg)  09/11/23 113 lb (51.3 kg)    BP 126/72   Pulse 85   Ht 5\' 3"  (1.6 m)   Wt 113 lb 12.8 oz (51.6 kg)   SpO2 96%   BMI 20.16 kg/m   1. Hypertension, essential, benign Chronic.  Controlled.  Stable.  Asymptomatic.  Tolerating medication well.  Blood pressure is 126/72.  Will continue lisinopril hydrochlorothiazide 20-12.5 mg once a day.  Will recheck patient in 2 weeks with medications at which time we may consider the hydrochlorothiazide arm of this medication and just maintaining on lisinopril. - lisinopril-hydrochlorothiazide (ZESTORETIC) 20-12.5 MG tablet; TAKE (1) TABLET BY MOUTH EVERY DAY  Dispense: 90 tablet; Refill: 1  2. Hyperlipidemia, unspecified hyperlipidemia type (Primary) Chronic.  Controlled.  Stable.  Asymptomatic.  I am not certain if she is taking this medication but patient will bring with me and it will be  determine if she is taking  on a regular basis. - atorvastatin (LIPITOR) 10 MG tablet; Take 1 tablet (10 mg total) by mouth daily.  Dispense: 90 tablet; Refill: 1  3. Anxiety and depression Chronic.  Controlled.  Stable.  PHQ is 5 GAD score is 6 and the uncertainty is whether patient is taking the sertraline or not if she is we will likely continue this at current dosing however if patient is not we will hold and we will recheck when patient returns in 2 weeks whether we will continue or not.  Elizabeth Sauer, MD

## 2024-02-28 NOTE — Patient Instructions (Signed)

## 2024-03-13 ENCOUNTER — Ambulatory Visit (INDEPENDENT_AMBULATORY_CARE_PROVIDER_SITE_OTHER): Admitting: Family Medicine

## 2024-03-13 ENCOUNTER — Encounter: Payer: Self-pay | Admitting: Family Medicine

## 2024-03-13 VITALS — BP 123/78 | HR 96 | Resp 16 | Ht 63.0 in | Wt 114.2 lb

## 2024-03-13 DIAGNOSIS — R194 Change in bowel habit: Secondary | ICD-10-CM

## 2024-03-13 DIAGNOSIS — E785 Hyperlipidemia, unspecified: Secondary | ICD-10-CM

## 2024-03-13 DIAGNOSIS — F419 Anxiety disorder, unspecified: Secondary | ICD-10-CM | POA: Diagnosis not present

## 2024-03-13 DIAGNOSIS — F32A Depression, unspecified: Secondary | ICD-10-CM

## 2024-03-13 DIAGNOSIS — I10 Essential (primary) hypertension: Secondary | ICD-10-CM | POA: Diagnosis not present

## 2024-03-13 MED ORDER — LISINOPRIL 10 MG PO TABS
10.0000 mg | ORAL_TABLET | Freq: Every day | ORAL | 1 refills | Status: DC
Start: 2024-03-13 — End: 2024-09-05

## 2024-03-13 MED ORDER — SERTRALINE HCL 50 MG PO TABS
50.0000 mg | ORAL_TABLET | Freq: Every day | ORAL | 1 refills | Status: DC
Start: 2024-03-13 — End: 2024-06-12

## 2024-03-13 MED ORDER — ATORVASTATIN CALCIUM 10 MG PO TABS: 10.0000 mg | ORAL_TABLET | Freq: Every day | ORAL | 1 refills | Status: DC

## 2024-03-13 NOTE — Progress Notes (Signed)
 Date:  03/13/2024   Name:  Angelica Walker   DOB:  07-Nov-1941   MRN:  [id number]   Chief Complaint: Anxiety (Follow up YES taking Zoloft.) and Stool Color Change (Black stool )  Hypertension This is a chronic problem. The current episode started more than 1 year ago. The problem has been waxing and waning since onset. The problem is controlled. Associated symptoms include anxiety. Pertinent negatives include no blurred vision, chest pain, headaches, malaise/fatigue, neck pain, orthopnea, palpitations, peripheral edema, PND, shortness of breath or sweats. There are no associated agents to hypertension. Risk factors for coronary artery disease include dyslipidemia. The current treatment provides moderate improvement. There are no compliance problems.  There is no history of angina, CAD/MI, CVA or PVD. There is no history of chronic renal disease, a hypertension causing med or renovascular disease.  Hyperlipidemia This is a chronic problem. The current episode started more than 1 year ago. The problem is controlled. Recent lipid tests were reviewed and are normal. She has no history of chronic renal disease, diabetes, hypothyroidism, liver disease, obesity or nephrotic syndrome. Pertinent negatives include no chest pain, focal sensory loss, focal weakness, leg pain, myalgias or shortness of breath. Current antihyperlipidemic treatment includes statins. The current treatment provides moderate improvement of lipids. There are no compliance problems.  Risk factors for coronary artery disease include dyslipidemia and hypertension.  Anxiety Presents for follow-up visit. Symptoms include excessive worry and nervous/anxious behavior. Patient reports no chest pain, nausea, palpitations or shortness of breath.      Lab Results  Component Value Date   NA 141 10/29/2023   K 4.1 10/29/2023   CO2 27 10/29/2023   GLUCOSE 95 10/29/2023   BUN 13 10/29/2023   CREATININE 0.74 10/29/2023   CALCIUM 10.6 (H)  10/29/2023   EGFR 82 10/29/2023   GFRNONAA 85 10/07/2020   Lab Results  Component Value Date   CHOL 217 (H) 03/21/2023   HDL 53 03/21/2023   LDLCALC 139 (H) 03/21/2023   TRIG 140 03/21/2023   CHOLHDL 3.5 12/19/2017   Lab Results  Component Value Date   TSH 1.320 01/30/2023   Lab Results  Component Value Date   HGBA1C 5.8 (H) 10/29/2023   Lab Results  Component Value Date   WBC 7.0 07/04/2023   HGB 12.5 07/04/2023   HCT 36.9 07/04/2023   MCV 90.4 07/04/2023   PLT 250 07/04/2023   Lab Results  Component Value Date   ALT 7 10/29/2023   AST 13 10/29/2023   ALKPHOS 70 10/29/2023   BILITOT 0.2 10/29/2023   No results found for: "25OHVITD2", "25OHVITD3", "VD25OH"   Review of Systems  Constitutional:  Negative for malaise/fatigue.  Eyes:  Negative for blurred vision.  Respiratory:  Negative for shortness of breath and wheezing.   Cardiovascular:  Negative for chest pain, palpitations, orthopnea, leg swelling and PND.  Gastrointestinal:  Negative for anal bleeding, blood in stool, constipation, diarrhea and nausea.  Musculoskeletal:  Negative for myalgias and neck pain.  Neurological:  Negative for focal weakness and headaches.  Psychiatric/Behavioral:  The patient is nervous/anxious.     Patient Active Problem List   Diagnosis Date Noted   Iron deficiency anemia due to chronic blood loss 07/02/2018   Smoker 03/18/2018   RLS (restless legs syndrome) 01/18/2018   Anemia due to blood loss 12/20/2017   Prediabetes 12/19/2017   Hyperlipidemia, unspecified 12/19/2017   Hypertension, essential, benign 12/19/2017   B12 deficiency 12/19/2017   DDD (degenerative disc  disease), lumbar 12/19/2017   Multiple gastric ulcers 12/19/2017   COPD (chronic obstructive pulmonary disease) (HCC) 12/21/2014    Allergies  Allergen Reactions   Simvastatin Other (See Comments)    Past Surgical History:  Procedure Laterality Date   CATARACT EXTRACTION W/PHACO Left 06/29/2020    Procedure: CATARACT EXTRACTION PHACO AND INTRAOCULAR LENS PLACEMENT (IOC) LEFT 7.20 00:42.7;  Surgeon: Nevada Crane, MD;  Location: [facility];  Service: Ophthalmology;  Laterality: Left;   CATARACT EXTRACTION W/PHACO Right 07/26/2020   Procedure: CATARACT EXTRACTION PHACO AND INTRAOCULAR LENS PLACEMENT (IOC) RIGHT 7.97  00:53.0;  Surgeon: Nevada Crane, MD;  Location: [facility];  Service: Ophthalmology;  Laterality: Right;   COLONOSCOPY WITH PROPOFOL N/A 12/07/2017   Procedure: COLONOSCOPY WITH PROPOFOL;  Surgeon: Christena Deem, MD;  Location: [facility] END[facility]COPY;  Service: Endoscopy;  Laterality: N/A;   ESOPHAGOGASTRODUODENOSCOPY (EGD) WITH PROPOFOL N/A 12/07/2017   Procedure: ESOPHAGOGASTRODUODENOSCOPY (EGD) WITH PROPOFOL;  Surgeon: Christena Deem, MD;  Location: [facility] ENDOSCOPY;  Service: Endoscopy;  Laterality: N/A;   ESOPHAGOGASTRODUODENOSCOPY (EGD) WITH PROPOFOL N/A 03/21/2018   Procedure: ESOPHAGOGASTRODUODENOSCOPY (EGD) WITH PROPOFOL;  Surgeon: Christena Deem, MD;  Location: [facility] ENDOSCOPY;  Service: Endoscopy;  Laterality: N/A;   ESOPHAGOGASTRODUODENOSCOPY (EGD) WITH PROPOFOL N/A 07/22/2018   Procedure: ESOPHAGOGASTRODUODENOSCOPY (EGD) WITH PROPOFOL;  Surgeon: Christena Deem, MD;  Location: [facility] ENDOSCOPY;  Service: Endoscopy;  Laterality: N/A;    Social History   Tobacco Use   Smoking status: Every Day    Current packs/day: 1.00    Average packs/day: 1 pack/day for 54.0 years (54.0 ttl pk-yrs)    Types: Cigarettes   Smokeless tobacco: Never   Tobacco comments:    patches and pills discussed   Vaping Use   Vaping status: Never Used  Substance Use Topics   Alcohol use: No   Drug use: No     Medication list has been reviewed and updated.  Current Meds  Medication Sig   acetaminophen (TYLENOL) 500 MG tablet Take 1,000 mg by mouth 2 (two) times daily as needed.   atorvastatin (LIPITOR) 10 MG tablet Take 1 tablet (10 mg total) by mouth  daily.   bismuth subsalicylate (PEPTO BISMOL) 262 MG/15ML suspension Take 30 mLs by mouth every 6 (six) hours as needed.   Ferrous Sulfate (IRON) 28 MG TABS Take by mouth.   GLUCOSAMINE-CHONDROITIN PO Take 3 tablets by mouth.   lisinopril-hydrochlorothiazide (ZESTORETIC) 20-12.5 MG tablet TAKE (1) TABLET BY MOUTH EVERY DAY   pantoprazole (PROTONIX) 40 MG tablet Take 40 mg by mouth daily.   sertraline (ZOLOFT) 50 MG tablet Take 1 tablet (50 mg total) by mouth daily.       02/28/2024    2:08 PM 09/11/2023    4:05 PM 07/31/2023    1:13 PM 03/21/2023   11:05 AM  GAD 7 : Generalized Anxiety Score  Nervous, Anxious, on Edge 2 0 2 1  Control/stop worrying 2 0 0 1  Worry too much - different things 2 0 0 1  Trouble relaxing 0 0 0 0  Restless 0 0 0 0  Easily annoyed or irritable 0 0 1 0  Afraid - awful might happen 0 0 0 0  Total GAD 7 Score 6 0 3 3  Anxiety Difficulty Not difficult at all Not difficult at all Not difficult at all Not difficult at all       03/13/2024    2:49 PM 02/28/2024    2:08 PM 09/11/2023  4:05 PM  Depression screen PHQ 2/9  Decreased Interest 0 0 0  Down, Depressed, Hopeless 0 0 0  PHQ - 2 Score 0 0 0  Altered sleeping  0 0  Tired, decreased energy  2 0  Change in appetite  0 0  Feeling bad or failure about yourself   0 0  Trouble concentrating  3 0  Moving slowly or fidgety/restless  0 0  Suicidal thoughts  0 0  PHQ-9 Score  5 0  Difficult doing work/chores  Not difficult at all Not difficult at all    BP Readings from Last 3 Encounters:  03/13/24 123/78  02/28/24 126/72  10/29/23 110/76    Physical Exam Vitals and nursing note reviewed.  HENT:     Right Ear: Tympanic membrane and ear canal normal.     Left Ear: Tympanic membrane and ear canal normal.     Mouth/Throat:     Mouth: Mucous membranes are moist.  Eyes:     Pupils: Pupils are equal, round, and reactive to light.  Cardiovascular:     Rate and Rhythm: Normal rate and regular rhythm.      Heart sounds: No murmur heard.    No friction rub. No gallop.  Pulmonary:     Breath sounds: No wheezing, rhonchi or rales.  Abdominal:     Tenderness: There is no abdominal tenderness. There is no guarding.  Neurological:     Mental Status: She is alert.     Wt Readings from Last 3 Encounters:  03/13/24 114 lb 3.2 oz (51.8 kg)  02/28/24 113 lb 12.8 oz (51.6 kg)  10/29/23 110 lb (49.9 kg)    BP 123/78   Pulse 96   Resp 16   Ht 5\' 3"  (1.6 m)   Wt 114 lb 3.2 oz (51.8 kg)   SpO2 96%   BMI 20.23 kg/m   Assessment and Plan:  1. Hypertension, essential, benign (Primary) Chronic.  Controlled.  Stable.  Blood pressure today is 123/78.  Asymptomatic.  Tolerating medications well.  Going to simplify her medications and I am going to stop the diuretic portion of her antihypertensive and continue lisinopril at 10 mg once a day.  Will recheck blood pressure and 3 weeks.  In the meantime we will check renal function panel for electrolytes and GFR. - Renal Function Panel - lisinopril (ZESTRIL) 10 MG tablet; Take 1 tablet (10 mg total) by mouth daily.  Dispense: 30 tablet; Refill: 1  2. Hyperlipidemia, unspecified hyperlipidemia type Chronic.  Controlled.  Stable.  Patient has been taking her atorvastatin for that reason even though patient is not fasting we will check a lipid panel today to see what current level of LDL control he has. - Lipid Panel With LDL/HDL Ratio - atorvastatin (LIPITOR) 10 MG tablet; Take 1 tablet (10 mg total) by mouth daily.  Dispense: 90 tablet; Refill: 1  3. Anxiety and depression Chronic.  Controlled.  Stable.  PHQ was 0 GAD score is 6.  Patient is now taking her sertraline 50 mg once a day and we will continue at current dosing we will recheck in 3 weeks. - sertraline (ZOLOFT) 50 MG tablet; Take 1 tablet (50 mg total) by mouth daily.  Dispense: 90 tablet; Refill: 1  4. Change in bowel habit New onset.  Patient noted dark bowel movements which she thought she  needed to take something for she is followed by hematology and is on iron supplementation I suspect this is contributing but  patient complicated things but taking Pepto-Bismol on her own which also further cause darkening of the bowel movements she is going to stop the Pepto-Bismol we will continue the iron we will check a CBC if there is a drop in her hemoglobin or continuation of her iron deficiency anemia.  Pending results of CBC and ferritin will dictate whether we continue the iron discontinued iron but we are certainly going to discontinue the Pepto-Bismol.  Will recheck in the patient in 3 weeks with the daughter since the patient does not have very good memory - CBC with Differential/Platelet    Elizabeth Sauer, MD

## 2024-04-03 ENCOUNTER — Ambulatory Visit: Admitting: Family Medicine

## 2024-04-29 ENCOUNTER — Other Ambulatory Visit: Payer: Self-pay | Admitting: Family Medicine

## 2024-06-02 DIAGNOSIS — Z961 Presence of intraocular lens: Secondary | ICD-10-CM | POA: Diagnosis not present

## 2024-06-02 DIAGNOSIS — H5000 Unspecified esotropia: Secondary | ICD-10-CM | POA: Diagnosis not present

## 2024-06-02 DIAGNOSIS — D3132 Benign neoplasm of left choroid: Secondary | ICD-10-CM | POA: Diagnosis not present

## 2024-06-02 DIAGNOSIS — H53022 Refractive amblyopia, left eye: Secondary | ICD-10-CM | POA: Diagnosis not present

## 2024-06-06 ENCOUNTER — Telehealth: Payer: Self-pay

## 2024-06-06 NOTE — Telephone Encounter (Signed)
 Patient will call back when she has a moment to set up appointment. She will need a Transfer of Care with one of the other providers here.

## 2024-06-06 NOTE — Telephone Encounter (Signed)
 Please call patient to schedule appt with new provider of their choice. Thank you!

## 2024-06-12 ENCOUNTER — Ambulatory Visit (INDEPENDENT_AMBULATORY_CARE_PROVIDER_SITE_OTHER): Admitting: Family Medicine

## 2024-06-12 ENCOUNTER — Ambulatory Visit: Admitting: Family Medicine

## 2024-06-12 ENCOUNTER — Encounter: Payer: Self-pay | Admitting: Family Medicine

## 2024-06-12 VITALS — BP 112/86 | HR 95 | Ht 63.0 in | Wt 114.2 lb

## 2024-06-12 DIAGNOSIS — K529 Noninfective gastroenteritis and colitis, unspecified: Secondary | ICD-10-CM

## 2024-06-12 DIAGNOSIS — M542 Cervicalgia: Secondary | ICD-10-CM

## 2024-06-12 MED ORDER — METHYLPREDNISOLONE 4 MG PO TBPK
ORAL_TABLET | ORAL | 0 refills | Status: DC
Start: 2024-06-12 — End: 2024-09-15

## 2024-06-12 NOTE — Progress Notes (Signed)
   Established Patient Office Visit  Subjective   Patient ID: Angelica Walker, female    DOB: 1941-06-03  Age: 83 y.o. MRN: [id number]  Chief Complaint  Patient presents with   Transitions Of Care    Patient presents today to establish care. She would like to discuss neck stiffness. She is having difficulty turning her her head to the left and right L>R. She had a injection before that helped, but that was years ago and she is not sure what type of injection it was.      Assessment & Plan:   Problem List Items Addressed This Visit   None Visit Diagnoses       Neck pain    -  Primary   Relevant Medications   methylPREDNISolone (MEDROL DOSEPAK) 4 MG TBPK tablet     Chronic diarrhea of unknown origin         Will do a trial of Medrol Dosepak, continue local heat and Tylenol  for pain. Discussed about starting probiotics to help with her gut biome.  Reassess next visit.  No follow-ups on file.   83 year old female presents to the clinic along with her daughter due to concerns of neck pain.  She is not sure how she ended up having neck pain but her daughter thinks she works in the yard .  Patient denies any fall or lightheaded feeling, paresthesias or muscle weakness in upper extremities.  Patient is also complaining of diarrhea which she had for a long time.  Patient denies any abdominal pain or blood in the stool.      Review of Systems  All other systems reviewed and are negative.     Objective:     BP 112/86   Pulse 95   Ht 5' 3 (1.6 m)   Wt 114 lb 3.2 oz (51.8 kg)   SpO2 98%   BMI 20.23 kg/m    Physical Exam Vitals and nursing note reviewed.  Constitutional:      Appearance: Normal appearance.  HENT:     Head: Normocephalic.     Right Ear: External ear normal.     Left Ear: External ear normal.   Eyes:     Conjunctiva/sclera: Conjunctivae normal.    Cardiovascular:     Rate and Rhythm: Normal rate.  Pulmonary:     Effort: Pulmonary effort is normal.  No respiratory distress.  Abdominal:     Palpations: Abdomen is soft.   Musculoskeletal:        General: Tenderness present. Normal range of motion.     Comments: Patient has some tenderness over the left side of her neck, passive range of motion restricted due to pain in her neck.   Skin:    General: Skin is warm.   Neurological:     Mental Status: She is alert and oriented to person, place, and time.   Psychiatric:        Mood and Affect: Mood normal.      No results found for any visits on 06/12/24.    The ASCVD Risk score (Arnett DK, et al., 2019) failed to calculate for the following reasons:   The 2019 ASCVD risk score is only valid for ages 68 to 87      Ladoris MARLA Ny, MD

## 2024-07-02 ENCOUNTER — Ambulatory Visit: Payer: Medicare Other | Admitting: Oncology

## 2024-07-02 ENCOUNTER — Other Ambulatory Visit: Payer: Medicare Other

## 2024-08-20 ENCOUNTER — Encounter: Payer: Self-pay | Admitting: Oncology

## 2024-08-20 ENCOUNTER — Inpatient Hospital Stay: Admitting: Oncology

## 2024-08-20 ENCOUNTER — Inpatient Hospital Stay: Attending: Oncology

## 2024-08-20 DIAGNOSIS — D509 Iron deficiency anemia, unspecified: Secondary | ICD-10-CM | POA: Diagnosis present

## 2024-08-20 DIAGNOSIS — F17210 Nicotine dependence, cigarettes, uncomplicated: Secondary | ICD-10-CM | POA: Diagnosis not present

## 2024-08-20 DIAGNOSIS — Z803 Family history of malignant neoplasm of breast: Secondary | ICD-10-CM | POA: Insufficient documentation

## 2024-08-20 LAB — IRON AND TIBC
Iron: 47 ug/dL (ref 28–170)
Saturation Ratios: 11 % (ref 10.4–31.8)
TIBC: 428 ug/dL (ref 250–450)
UIBC: 381 ug/dL

## 2024-08-20 LAB — FERRITIN: Ferritin: 15 ng/mL (ref 11–307)

## 2024-08-20 LAB — CBC (CANCER CENTER ONLY)
HCT: 34.1 % — ABNORMAL LOW (ref 36.0–46.0)
Hemoglobin: 10.6 g/dL — ABNORMAL LOW (ref 12.0–15.0)
MCH: 27.2 pg (ref 26.0–34.0)
MCHC: 31.1 g/dL (ref 30.0–36.0)
MCV: 87.4 fL (ref 80.0–100.0)
Platelet Count: 294 K/uL (ref 150–400)
RBC: 3.9 MIL/uL (ref 3.87–5.11)
RDW: 15.9 % — ABNORMAL HIGH (ref 11.5–15.5)
WBC Count: 6.6 K/uL (ref 4.0–10.5)
nRBC: 0 % (ref 0.0–0.2)

## 2024-08-20 NOTE — Progress Notes (Unsigned)
 Hematology/Oncology Consult note [facility]  [phone] [phone]  Patient Care Team: Joshua Cathryne BROCKS, MD (Inactive) as PCP - General (Family Medicine) Jane Delmar Pike, NP as Nurse Practitioner (Gastroenterology) Aundria Ladell POUR, MD as Consulting Physician (Gastroenterology) Melanee Annah BROCKS, MD as Consulting Physician (Oncology)   Name of the patient: Angelica Walker  [id number]  1941-02-08   Date of visit: 08/20/24  Diagnosis- ***  Chief complaint/ Reason for visit- ***  Heme/Onc history: ***  Interval history- ***  ECOG PS- *** Pain scale- *** Opioid associated constipation- ***  Review of systems- ROS    Allergies  Allergen Reactions   Simvastatin Other (See Comments)     Past Medical History:  Diagnosis Date   Anemia    COPD (chronic obstructive pulmonary disease) (HCC)    GERD (gastroesophageal reflux disease)    Hyperlipidemia    Hypertension    Multilevel degenerative disc disease    Serum lipids high      Past Surgical History:  Procedure Laterality Date   CATARACT EXTRACTION W/PHACO Left 06/29/2020   Procedure: CATARACT EXTRACTION PHACO AND INTRAOCULAR LENS PLACEMENT (IOC) LEFT 7.20 00:42.7;  Surgeon: Myrna Adine Anes, MD;  Location: [facility];  Service: Ophthalmology;  Laterality: Left;   CATARACT EXTRACTION W/PHACO Right 07/26/2020   Procedure: CATARACT EXTRACTION PHACO AND INTRAOCULAR LENS PLACEMENT (IOC) RIGHT 7.97  00:53.0;  Surgeon: Myrna Adine Anes, MD;  Location: [facility];  Service: Ophthalmology;  Laterality: Right;   COLONOSCOPY WITH PROPOFOL  N/A 12/07/2017   Procedure: COLONOSCOPY WITH PROPOFOL ;  Surgeon: Gaylyn Gladis PENNER, MD;  Location: [facility] ENDOSCOPY;  Service: Endoscopy;  Laterality: N/A;   ESOPHAGOGASTRODUODENOSCOPY (EGD) WITH PROPOFOL  N/A 12/07/2017   Procedure: ESOPHAGOGASTRODUODENOSCOPY (EGD) WITH PROPOFOL ;  Surgeon: Gaylyn Gladis PENNER, MD;  Location:  [facility] ENDOSCOPY;  Service: Endoscopy;  Laterality: N/A;   ESOPHAGOGASTRODUODENOSCOPY (EGD) WITH PROPOFOL  N/A 03/21/2018   Procedure: ESOPHAGOGASTRODUODENOSCOPY (EGD) WITH PROPOFOL ;  Surgeon: Gaylyn Gladis PENNER, MD;  Location: [facility] ENDOSCOPY;  Service: Endoscopy;  Laterality: N/A;   ESOPHAGOGASTRODUODENOSCOPY (EGD) WITH PROPOFOL  N/A 07/22/2018   Procedure: ESOPHAGOGASTRODUODENOSCOPY (EGD) WITH PROPOFOL ;  Surgeon: Gaylyn Gladis PENNER, MD;  Location: [facility] ENDOSCOPY;  Service: Endoscopy;  Laterality: N/A;    Social History   Socioeconomic History   Marital status: Divorced    Spouse name: Not on file   Number of children: 1   Years of education: some college   Highest education level: 12th grade  Occupational History   Occupation: Retired  Tobacco Use   Smoking status: Every Day    Current packs/day: 1.00    Average packs/day: 1 pack/day for 54.0 years (54.0 ttl pk-yrs)    Types: Cigarettes   Smokeless tobacco: Never   Tobacco comments:    patches and pills discussed   Vaping Use   Vaping status: Never Used  Substance and Sexual Activity   Alcohol use: No   Drug use: No   Sexual activity: Not Currently  Other Topics Concern   Not on file  Social History Narrative   Not on file   Social Drivers of Health   Financial Resource Strain: Low Risk  (09/05/2023)   Overall Financial Resource Strain (CARDIA)    Difficulty of Paying Living Expenses: Not hard at all  Food Insecurity: No Food Insecurity (09/05/2023)   Hunger Vital Sign    Worried About Running Out of Food in the Last Year: Never true    Ran Out of Food in the Last Year:  Never true  Transportation Needs: No Transportation Needs (09/05/2023)   PRAPARE - Administrator, Civil Service (Medical): No    Lack of Transportation (Non-Medical): No  Physical Activity: Insufficiently Active (09/05/2023)   Exercise Vital Sign    Days of Exercise per Week: 3 days    Minutes of Exercise per Session: 40 min  Stress: No Stress  Concern Present (09/05/2023)   [employer] of Occupational Health - Occupational Stress Questionnaire    Feeling of Stress : Not at all  Social Connections: Socially Isolated (09/05/2023)   Social Connection and Isolation Panel    Frequency of Communication with Friends and Family: More than three times a week    Frequency of Social Gatherings with Friends and Family: More than three times a week    Attends Religious Services: Never    Database administrator or Organizations: No    Attends Banker Meetings: Never    Marital Status: Never married  Intimate Partner Violence: Not At Risk (09/05/2023)   Humiliation, Afraid, Rape, and Kick questionnaire    Fear of Current or Ex-Partner: No    Emotionally Abused: No    Physically Abused: No    Sexually Abused: No    Family History  Problem Relation Age of Onset   Breast cancer Maternal Aunt      Current Outpatient Medications:    acetaminophen  (TYLENOL ) 500 MG tablet, Take 1,000 mg by mouth 2 (two) times daily as needed., Disp: , Rfl:    atorvastatin  (LIPITOR) 10 MG tablet, Take 1 tablet (10 mg total) by mouth daily., Disp: 90 tablet, Rfl: 1   bismuth subsalicylate (PEPTO BISMOL) 262 MG/15ML suspension, Take 30 mLs by mouth every 6 (six) hours as needed., Disp: , Rfl:    Calcium  Carbonate-Vitamin D 600-200 MG-UNIT TABS, Take by mouth., Disp: , Rfl:    Cyanocobalamin (B-12) 1000 MCG TABS, Take 1 tablet by mouth daily., Disp: 30 tablet, Rfl:    donepezil (ARICEPT) 5 MG tablet, Take 5 mg by mouth at bedtime., Disp: , Rfl:    Ferrous Sulfate  (IRON ) 28 MG TABS, Take by mouth., Disp: , Rfl:    GLUCOSAMINE-CHONDROITIN PO, Take 3 tablets by mouth., Disp: , Rfl:    lisinopril  (ZESTRIL ) 10 MG tablet, Take 1 tablet (10 mg total) by mouth daily., Disp: 30 tablet, Rfl: 1   methylPREDNISolone  (MEDROL  DOSEPAK) 4 MG TBPK tablet, Use as directed., Disp: 21 each, Rfl: 0   pantoprazole  (PROTONIX ) 40 MG tablet, TAKE (1) TABLET BY MOUTH  EVERY DAY, Disp: 90 tablet, Rfl: 1  Physical exam: There were no vitals filed for this visit. Physical Exam   I have personally reviewed labs listed below:    Latest Ref Rng & Units 10/29/2023    3:11 PM  CMP  Glucose 70 - 99 mg/dL 95   BUN 8 - 27 mg/dL 13   Creatinine 9.42 - 1.00 mg/dL 9.25   Sodium 865 - 855 mmol/L 141   Potassium 3.5 - 5.2 mmol/L 4.1   Chloride 96 - 106 mmol/L 101   CO2 20 - 29 mmol/L 27   Calcium  8.7 - 10.3 mg/dL 89.3   Total Protein 6.0 - 8.5 g/dL 6.6   Total Bilirubin 0.0 - 1.2 mg/dL 0.2   Alkaline Phos 44 - 121 IU/L 70   AST 0 - 40 IU/L 13   ALT 0 - 32 IU/L 7       Latest Ref Rng & Units 08/20/2024  2:34 PM  CBC  WBC 4.0 - 10.5 K/uL 6.6   Hemoglobin 12.0 - 15.0 g/dL 89.3   Hematocrit 63.9 - 46.0 % 34.1   Platelets 150 - 400 K/uL 294    I have personally reviewed Radiology images listed below: No images are attached to the encounter.  No results found.   Assessment and plan- Patient is a 83 y.o. female ***   Visit Diagnosis No diagnosis found.   Dr. Annah Skene, MD, MPH [facility] [phone] 08/20/2024 3:03 PM

## 2024-08-21 ENCOUNTER — Ambulatory Visit: Payer: Self-pay | Admitting: Oncology

## 2024-08-26 ENCOUNTER — Inpatient Hospital Stay: Admitting: Oncology

## 2024-08-26 ENCOUNTER — Inpatient Hospital Stay

## 2024-08-26 ENCOUNTER — Encounter: Payer: Self-pay | Admitting: Oncology

## 2024-08-26 VITALS — BP 133/64 | HR 68 | Resp 18

## 2024-08-26 VITALS — BP 142/81 | HR 71 | Temp 98.6°F | Resp 20 | Wt 122.0 lb

## 2024-08-26 DIAGNOSIS — D509 Iron deficiency anemia, unspecified: Secondary | ICD-10-CM | POA: Diagnosis not present

## 2024-08-26 DIAGNOSIS — D500 Iron deficiency anemia secondary to blood loss (chronic): Secondary | ICD-10-CM

## 2024-08-26 MED ORDER — IRON SUCROSE 20 MG/ML IV SOLN
200.0000 mg | INTRAVENOUS | Status: DC
  Administered 2024-08-26: 200 mg via INTRAVENOUS
  Filled 2024-08-26: qty 10

## 2024-08-26 NOTE — Progress Notes (Signed)
 Hematology/Oncology Consult note [facility]  [phone] [phone]  Patient Care Team: Joshua Cathryne BROCKS, MD (Inactive) as PCP - General (Family Medicine) Jane Delmar Pike, NP as Nurse Practitioner (Gastroenterology) Aundria Ladell POUR, MD as Consulting Physician (Gastroenterology) Melanee Annah BROCKS, MD as Consulting Physician (Oncology)   Name of the patient: Angelica Walker  [id number]  08-07-1941   Date of visit: 08/26/24  Diagnosis-iron  deficiency anemia  Chief complaint/ Reason for visit-routine follow-up of iron  deficiency anemia  Heme/Onc history: Patient is a 83 year old female with history of iron  deficiency anemia secondary to gastric ulcer and GI bleeding back in April 2019.EGD on 12/07/2017 revealed  Z-line variable.  There was a non-obstructing non-bleeding gastric ulcer with no stigmata of bleeding. The duodenum was normal. Biopsies showed chronic nonspecific gastritis.  There was no H. pylori, dysplasia or malignancy. EGD on 03/21/2018 revealed Z-line variable.  There was erosive gastritis with healing ulceration in the stomach.  There was no H. pylori, dysplasia or malignancy.  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 EGD on 07/22/2018 revealed Z-line variable.  There was non-bleeding gastric ulcer with no stigmata of bleeding.  Acquired deformity in the gastric body (posterior wall).  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      Colonoscopy on 12/07/2017 revealed three 1 to 2 mm polyps in the rectum, removed. Diverticulosis in the sigmoid colon. Tortuous colon. 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  Pathology revealed 3 hyperplastic polyps.  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      She has required IV iron  periodically.    She also gets low-dose screening CT chest on a yearly basis given her history of smoking    Interval history-patient reports ongoing fatigue.  She denies any blood loss in her stool or urine.  Denies any dark melanotic stools  ECOG PS- 1 Pain scale- 0   Review of systems- Review of Systems  Constitutional:  Negative for chills, fever, malaise/fatigue and weight loss.  HENT:  Negative for congestion, ear discharge and nosebleeds.   Eyes:  Negative for blurred vision.  Respiratory:  Negative for cough, hemoptysis, sputum production, shortness of breath and wheezing.   Cardiovascular:  Negative for chest pain, palpitations, orthopnea and claudication.  Gastrointestinal:  Negative for abdominal pain, blood in stool, constipation, diarrhea, heartburn, melena, nausea and vomiting.  Genitourinary:  Negative for dysuria, flank pain, frequency, hematuria and urgency.  Musculoskeletal:  Negative for back pain, joint pain and myalgias.  Skin:  Negative for rash.  Neurological:  Negative for dizziness, tingling, focal weakness, seizures, weakness and headaches.  Endo/Heme/Allergies:  Does not bruise/bleed easily.  Psychiatric/Behavioral:  Negative for depression and suicidal ideas. The patient does not have insomnia.       Allergies  Allergen Reactions   Simvastatin Other (See Comments)     Past Medical History:  Diagnosis Date   Anemia    COPD (chronic obstructive pulmonary disease) (HCC)    GERD (gastroesophageal reflux disease)    Hyperlipidemia    Hypertension    Multilevel degenerative disc disease    Serum lipids high      Past Surgical History:  Procedure Laterality Date   CATARACT EXTRACTION W/PHACO Left 06/29/2020   Procedure: CATARACT EXTRACTION PHACO AND INTRAOCULAR LENS PLACEMENT (IOC) LEFT 7.20 00:42.7;  Surgeon: Myrna Adine Anes, MD;  Location:  [facility];  Service: Ophthalmology;  Laterality: Left;   CATARACT EXTRACTION W/PHACO Right 07/26/2020    Procedure: CATARACT EXTRACTION PHACO AND INTRAOCULAR LENS PLACEMENT (IOC) RIGHT 7.97  00:53.0;  Surgeon: Myrna Adine Anes, MD;  Location: [facility];  Service: Ophthalmology;  Laterality: Right;   COLONOSCOPY WITH PROPOFOL  N/A 12/07/2017   Procedure: COLONOSCOPY WITH PROPOFOL ;  Surgeon: Gaylyn Gladis PENNER, MD;  Location: [facility] ENDOSCOPY;  Service: Endoscopy;  Laterality: N/A;   ESOPHAGOGASTRODUODENOSCOPY (EGD) WITH PROPOFOL  N/A 12/07/2017   Procedure: ESOPHAGOGASTRODUODENOSCOPY (EGD) WITH PROPOFOL ;  Surgeon: Gaylyn Gladis PENNER, MD;  Location: [facility];  Service: Endoscopy;  Laterality: N/A;   ESOPHAGOGASTRODUODENOSCOPY (EGD) WITH PROPOFOL  N/A 03/21/2018   Procedure: ESOPHAGOGASTRODUODENOSCOPY (EGD) WITH PROPOFOL ;  Surgeon: Gaylyn Gladis PENNER, MD;  Location: [facility] ENDOSCOPY;  Service: Endoscopy;  Laterality: N/A;   ESOPHAGOGASTRODUODENOSCOPY (EGD) WITH PROPOFOL  N/A 07/22/2018   Procedure: ESOPHAGOGASTRODUODENOSCOPY (EGD) WITH PROPOFOL ;  Surgeon: Gaylyn Gladis PENNER, MD;  Location: [facility] ENDOSCOPY;  Service: Endoscopy;  Laterality: N/A;    Social History   Socioeconomic History   Marital status: Divorced    Spouse name: Not on file   Number of children: 1   Years of education: some college   Highest education level: 12th grade  Occupational History   Occupation: Retired  Tobacco Use   Smoking status: Every Day    Current packs/day: 1.00    Average packs/day: 1 pack/day for 54.0 years (54.0 ttl pk-yrs)    Types: Cigarettes   Smokeless tobacco: Never   Tobacco comments:    patches and pills discussed   Vaping Use   Vaping status: Never Used  Substance and Sexual Activity   Alcohol use: No   Drug use: No   Sexual activity: Not Currently  Other Topics Concern   Not on file  Social History Narrative   Not on file   Social Drivers of Health   Financial Resource Strain: Low Risk  (09/05/2023)   Overall Financial Resource Strain (CARDIA)    Difficulty of Paying Living  Expenses: Not hard at all  Food Insecurity: No Food Insecurity (09/05/2023)   Hunger Vital Sign    Worried About Running Out of Food in the Last Year: Never true    Ran Out of Food in the Last Year: Never true  Transportation Needs: No Transportation Needs (09/05/2023)   PRAPARE - Administrator, Civil Service (Medical): No    Lack of Transportation (Non-Medical): No  Physical Activity: Insufficiently Active (09/05/2023)   Exercise Vital Sign    Days of Exercise per Week: 3 days    Minutes of Exercise per Session: 40 min  Stress: No Stress Concern Present (09/05/2023)   [employer] of Occupational Health - Occupational Stress Questionnaire    Feeling of Stress : Not at all  Social Connections: Socially Isolated (09/05/2023)   Social Connection and Isolation Panel    Frequency of Communication with Friends and Family: More than three times a week    Frequency of Social Gatherings with Friends and Family: More than three times a week    Attends Religious Services: Never    Database administrator or Organizations: No    Attends Banker Meetings: Never    Marital Status: Never married  Intimate Partner Violence: Not At Risk (09/05/2023)   Humiliation, Afraid, Rape, and Kick questionnaire    Fear of Current or Ex-Partner: No    Emotionally Abused: No    Physically Abused: No    Sexually  Abused: No    Family History  Problem Relation Age of Onset   Breast cancer Maternal Aunt      Current Outpatient Medications:    acetaminophen  (TYLENOL ) 500 MG tablet, Take 1,000 mg by mouth 2 (two) times daily as needed., Disp: , Rfl:    atorvastatin  (LIPITOR) 10 MG tablet, Take 1 tablet (10 mg total) by mouth daily., Disp: 90 tablet, Rfl: 1   bismuth subsalicylate (PEPTO BISMOL) 262 MG/15ML suspension, Take 30 mLs by mouth every 6 (six) hours as needed., Disp: , Rfl:    Calcium  Carbonate-Vitamin D 600-200 MG-UNIT TABS, Take by mouth., Disp: , Rfl:    Cyanocobalamin  (B-12) 1000 MCG TABS, Take 1 tablet by mouth daily., Disp: 30 tablet, Rfl:    donepezil (ARICEPT) 5 MG tablet, Take 5 mg by mouth at bedtime., Disp: , Rfl:    Ferrous Sulfate  (IRON ) 28 MG TABS, Take by mouth., Disp: , Rfl:    GLUCOSAMINE-CHONDROITIN PO, Take 3 tablets by mouth., Disp: , Rfl:    lisinopril  (ZESTRIL ) 10 MG tablet, Take 1 tablet (10 mg total) by mouth daily., Disp: 30 tablet, Rfl: 1   methylPREDNISolone  (MEDROL  DOSEPAK) 4 MG TBPK tablet, Use as directed., Disp: 21 each, Rfl: 0   pantoprazole  (PROTONIX ) 40 MG tablet, TAKE (1) TABLET BY MOUTH EVERY DAY, Disp: 90 tablet, Rfl: 1   traZODone  (DESYREL ) 50 MG tablet, Take 50 mg by mouth at bedtime., Disp: , Rfl:   Physical exam:  Vitals:   08/26/24 1001  BP: (!) 142/81  Pulse: 71  Resp: 20  Temp: 98.6 F (37 C)  SpO2: 100%  Weight: 122 lb (55.3 kg)   Physical Exam Cardiovascular:     Rate and Rhythm: Normal rate and regular rhythm.     Heart sounds: Normal heart sounds.  Pulmonary:     Effort: Pulmonary effort is normal.     Breath sounds: Normal breath sounds.  Skin:    General: Skin is warm and dry.  Neurological:     Mental Status: She is alert and oriented to person, place, and time.      I have personally reviewed labs listed below:    Latest Ref Rng & Units 10/29/2023    3:11 PM  CMP  Glucose 70 - 99 mg/dL 95   BUN 8 - 27 mg/dL 13   Creatinine 9.42 - 1.00 mg/dL 9.25   Sodium 865 - 855 mmol/L 141   Potassium 3.5 - 5.2 mmol/L 4.1   Chloride 96 - 106 mmol/L 101   CO2 20 - 29 mmol/L 27   Calcium  8.7 - 10.3 mg/dL 89.3   Total Protein 6.0 - 8.5 g/dL 6.6   Total Bilirubin 0.0 - 1.2 mg/dL 0.2   Alkaline Phos 44 - 121 IU/L 70   AST 0 - 40 IU/L 13   ALT 0 - 32 IU/L 7       Latest Ref Rng & Units 08/20/2024    2:34 PM  CBC  WBC 4.0 - 10.5 K/uL 6.6   Hemoglobin 12.0 - 15.0 g/dL 89.3   Hematocrit 63.9 - 46.0 % 34.1   Platelets 150 - 400 K/uL 294       Assessment and plan- Patient is a 83 y.o. female here  for routine follow-up of iron  deficiency anemia  Patient had a normal hemoglobin of 12.5 last year with a ferritin level of 253.  Presently patient is mildly anemic with a hemoglobin of 10.6 and labs indicate of of iron   deficiency given that her ferritin is 15.  We will proceed with IV iron  at this time.  She will receive Venofer  200 mg x 5 doses.  Discussed risks and benefits of IV iron  including all but not limited to possible risk of infusion anaphylactic reaction.  Patient understands and agrees to proceed as planned.  Repeat CBC ferritin and iron  studies in 3 and 6 months and I will see her back in 6 months.  I also discussed GI workup at this time but patient does not wish to undergo the same given her age.   Visit Diagnosis 1. Iron  deficiency anemia, unspecified iron  deficiency anemia type      Dr. Annah Skene, MD, MPH [facility] [phone] 08/26/2024 10:32 AM

## 2024-08-26 NOTE — Patient Instructions (Signed)

## 2024-08-29 ENCOUNTER — Inpatient Hospital Stay

## 2024-08-29 VITALS — BP 142/87 | HR 83 | Resp 18

## 2024-08-29 DIAGNOSIS — D500 Iron deficiency anemia secondary to blood loss (chronic): Secondary | ICD-10-CM

## 2024-08-29 DIAGNOSIS — D509 Iron deficiency anemia, unspecified: Secondary | ICD-10-CM | POA: Diagnosis not present

## 2024-08-29 MED ORDER — SODIUM CHLORIDE 0.9% FLUSH
10.0000 mL | Freq: Once | INTRAVENOUS | Status: AC | PRN
  Administered 2024-08-29: 10 mL
  Filled 2024-08-29: qty 10

## 2024-08-29 MED ORDER — IRON SUCROSE 20 MG/ML IV SOLN
200.0000 mg | INTRAVENOUS | Status: DC
  Administered 2024-08-29: 200 mg via INTRAVENOUS
  Filled 2024-08-29: qty 10

## 2024-08-29 NOTE — Progress Notes (Signed)
 Patient tolerated Venofer infusion well, no questions/concerns voiced. Monitored 30 min post transfusion. Patient stable at discharge. VSS. AVS given.

## 2024-08-29 NOTE — Patient Instructions (Signed)

## 2024-09-02 ENCOUNTER — Inpatient Hospital Stay

## 2024-09-02 VITALS — BP 159/55 | HR 74 | Temp 98.9°F | Resp 16

## 2024-09-02 DIAGNOSIS — D509 Iron deficiency anemia, unspecified: Secondary | ICD-10-CM | POA: Diagnosis not present

## 2024-09-02 DIAGNOSIS — D500 Iron deficiency anemia secondary to blood loss (chronic): Secondary | ICD-10-CM

## 2024-09-02 MED ORDER — IRON SUCROSE 20 MG/ML IV SOLN
200.0000 mg | INTRAVENOUS | Status: DC
  Administered 2024-09-02: 200 mg via INTRAVENOUS
  Filled 2024-09-02: qty 10

## 2024-09-02 NOTE — Patient Instructions (Signed)

## 2024-09-05 ENCOUNTER — Inpatient Hospital Stay

## 2024-09-05 ENCOUNTER — Other Ambulatory Visit: Payer: Self-pay

## 2024-09-05 VITALS — BP 153/80 | HR 67 | Temp 98.2°F | Resp 16

## 2024-09-05 DIAGNOSIS — D500 Iron deficiency anemia secondary to blood loss (chronic): Secondary | ICD-10-CM

## 2024-09-05 DIAGNOSIS — D509 Iron deficiency anemia, unspecified: Secondary | ICD-10-CM | POA: Diagnosis not present

## 2024-09-05 DIAGNOSIS — I10 Essential (primary) hypertension: Secondary | ICD-10-CM

## 2024-09-05 MED ORDER — SODIUM CHLORIDE 0.9% FLUSH
10.0000 mL | Freq: Once | INTRAVENOUS | Status: AC | PRN
  Administered 2024-09-05: 10 mL
  Filled 2024-09-05: qty 10

## 2024-09-05 MED ORDER — IRON SUCROSE 20 MG/ML IV SOLN
200.0000 mg | INTRAVENOUS | Status: DC
  Administered 2024-09-05: 200 mg via INTRAVENOUS
  Filled 2024-09-05: qty 10

## 2024-09-08 MED ORDER — PANTOPRAZOLE SODIUM 40 MG PO TBEC: 40.0000 mg | DELAYED_RELEASE_TABLET | Freq: Every day | ORAL | 0 refills | Status: DC

## 2024-09-08 MED ORDER — TRAZODONE HCL 50 MG PO TABS: 50.0000 mg | ORAL_TABLET | Freq: Every day | ORAL | 0 refills | Status: AC

## 2024-09-08 MED ORDER — LISINOPRIL 10 MG PO TABS: 10.0000 mg | ORAL_TABLET | Freq: Every day | ORAL | 1 refills | Status: DC

## 2024-09-09 ENCOUNTER — Inpatient Hospital Stay

## 2024-09-09 VITALS — BP 153/87 | HR 88 | Temp 98.4°F | Resp 18

## 2024-09-09 DIAGNOSIS — D509 Iron deficiency anemia, unspecified: Secondary | ICD-10-CM | POA: Diagnosis not present

## 2024-09-09 DIAGNOSIS — D500 Iron deficiency anemia secondary to blood loss (chronic): Secondary | ICD-10-CM

## 2024-09-09 MED ORDER — IRON SUCROSE 20 MG/ML IV SOLN
200.0000 mg | INTRAVENOUS | Status: DC
  Administered 2024-09-09: 200 mg via INTRAVENOUS
  Filled 2024-09-09: qty 10

## 2024-09-09 MED ORDER — SODIUM CHLORIDE 0.9% FLUSH
10.0000 mL | Freq: Once | INTRAVENOUS | Status: AC | PRN
  Administered 2024-09-09: 10 mL
  Filled 2024-09-09: qty 10

## 2024-09-10 ENCOUNTER — Ambulatory Visit: Payer: Self-pay | Admitting: Emergency Medicine

## 2024-09-10 VITALS — Ht 60.0 in | Wt 122.0 lb

## 2024-09-10 DIAGNOSIS — Z0000 Encounter for general adult medical examination without abnormal findings: Secondary | ICD-10-CM | POA: Diagnosis not present

## 2024-09-10 DIAGNOSIS — Z780 Asymptomatic menopausal state: Secondary | ICD-10-CM

## 2024-09-10 DIAGNOSIS — Z1231 Encounter for screening mammogram for malignant neoplasm of breast: Secondary | ICD-10-CM

## 2024-09-10 NOTE — Progress Notes (Signed)
 Subjective:   Angelica Walker is a 83 y.o. who presents for a Medicare Wellness preventive visit.  As a reminder, Annual Wellness Visits don't include a physical exam, and some assessments may be limited, especially if this visit is performed virtually. We may recommend an in-person follow-up visit with your provider if needed.  Visit Complete: Virtual I connected with  Angelica Walker on 09/10/24 by a audio enabled telemedicine application and verified that I am speaking with the correct person using two identifiers.  Patient Location: Home  Provider Location: Home Office  I discussed the limitations of evaluation and management by telemedicine. The patient expressed understanding and agreed to proceed.  Vital Signs: Because this visit was a virtual/telehealth visit, some criteria may be missing or patient reported. Any vitals not documented were not able to be obtained and vitals that have been documented are patient reported.  VideoDeclined- This patient declined Librarian, academic. Therefore the visit was completed with audio only.  Persons Participating in Visit: Patient assisted by Angelica Walker, daughter.  AWV Questionnaire: No: Patient Medicare AWV questionnaire was not completed prior to this visit.  Cardiac Risk Factors include: advanced age (>42men, >19 women);dyslipidemia;hypertension;smoking/ tobacco exposure;Other (see comment), Risk factor comments: prediabetic     Objective:    Today's Vitals   09/10/24 1008  Weight: 122 lb (55.3 kg)  Height: 5' (1.524 m)   Body mass index is 23.83 kg/m.     09/10/2024   10:25 AM 08/26/2024   10:02 AM 08/20/2024    2:48 PM 09/05/2023    1:53 PM 09/03/2023    2:45 PM 08/16/2023    9:51 AM 07/04/2023    2:54 PM  Advanced Directives  Does Patient Have a Medical Advance Directive? No No No No Yes Yes Yes  Type of Careers adviser;Living will Healthcare Power of [place];Living  will  Would patient like information on creating a medical advance directive? Yes (MAU/Ambulatory/Procedural Areas - Information given) No - Patient declined  No - Patient declined       Current Medications (verified) Outpatient Encounter Medications as of 09/10/2024  Medication Sig   acetaminophen  (TYLENOL ) 500 MG tablet Take 1,000 mg by mouth 2 (two) times daily as needed.   atorvastatin  (LIPITOR) 10 MG tablet Take 1 tablet (10 mg total) by mouth daily.   bismuth subsalicylate (PEPTO BISMOL) 262 MG/15ML suspension Take 30 mLs by mouth every 6 (six) hours as needed.   donepezil (ARICEPT) 5 MG tablet Take 5 mg by mouth at bedtime.   lisinopril  (ZESTRIL ) 10 MG tablet Take 1 tablet (10 mg total) by mouth daily.   pantoprazole  (PROTONIX ) 40 MG tablet Take 1 tablet (40 mg total) by mouth daily.   traZODone  (DESYREL ) 50 MG tablet Take 1 tablet (50 mg total) by mouth at bedtime.   Calcium  Carbonate-Vitamin D 600-200 MG-UNIT TABS Take by mouth. (Patient not taking: Reported on 09/10/2024)   Cyanocobalamin (B-12) 1000 MCG TABS Take 1 tablet by mouth daily. (Patient not taking: Reported on 09/10/2024)   Ferrous Sulfate  (IRON ) 28 MG TABS Take by mouth. (Patient not taking: Reported on 09/10/2024)   GLUCOSAMINE-CHONDROITIN PO Take 3 tablets by mouth. (Patient not taking: Reported on 09/10/2024)   methylPREDNISolone  (MEDROL  DOSEPAK) 4 MG TBPK tablet Use as directed. (Patient not taking: Reported on 09/10/2024)   No facility-administered encounter medications on file as of 09/10/2024.    Allergies (verified) Simvastatin   History: Past Medical History:  Diagnosis Date   Anemia    COPD (chronic obstructive pulmonary disease) (HCC)    GERD (gastroesophageal reflux disease)    Hyperlipidemia    Hypertension    Multilevel degenerative disc disease    Serum lipids high    Past Surgical History:  Procedure Laterality Date   CATARACT EXTRACTION W/PHACO Left 06/29/2020   Procedure: CATARACT EXTRACTION  PHACO AND INTRAOCULAR LENS PLACEMENT (IOC) LEFT 7.20 00:42.7;  Surgeon: Myrna Adine Anes, MD;  Location: [facility];  Service: Ophthalmology;  Laterality: Left;   CATARACT EXTRACTION W/PHACO Right 07/26/2020   Procedure: CATARACT EXTRACTION PHACO AND INTRAOCULAR LENS PLACEMENT (IOC) RIGHT 7.97  00:53.0;  Surgeon: Myrna Adine Anes, MD;  Location: [facility];  Service: Ophthalmology;  Laterality: Right;   COLONOSCOPY WITH PROPOFOL  N/A 12/07/2017   Procedure: COLONOSCOPY WITH PROPOFOL ;  Surgeon: Gaylyn Gladis PENNER, MD;  Location: [facility];  Service: Endoscopy;  Laterality: N/A;   ESOPHAGOGASTRODUODENOSCOPY (EGD) WITH PROPOFOL  N/A 12/07/2017   Procedure: ESOPHAGOGASTRODUODENOSCOPY (EGD) WITH PROPOFOL ;  Surgeon: Gaylyn Gladis PENNER, MD;  Location: [facility];  Service: Endoscopy;  Laterality: N/A;   ESOPHAGOGASTRODUODENOSCOPY (EGD) WITH PROPOFOL  N/A 03/21/2018   Procedure: ESOPHAGOGASTRODUODENOSCOPY (EGD) WITH PROPOFOL ;  Surgeon: Gaylyn Gladis PENNER, MD;  Location: [facility];  Service: Endoscopy;  Laterality: N/A;   ESOPHAGOGASTRODUODENOSCOPY (EGD) WITH PROPOFOL  N/A 07/22/2018   Procedure: ESOPHAGOGASTRODUODENOSCOPY (EGD) WITH PROPOFOL ;  Surgeon: Gaylyn Gladis PENNER, MD;  Location: [facility] END[facility];  Service: Endoscopy;  Laterality: N/A;   Family History  Problem Relation Age of Onset   Other Mother        unknown medical history   Other Father        unknown medical history   Breast cancer Maternal Aunt    Social History   Socioeconomic History   Marital status: Divorced    Spouse name: Not on file   Number of children: 1   Years of education: some college   Highest education level: 12th grade  Occupational History   Occupation: Retired  Tobacco Use   Smoking status: Every Day    Current packs/day: 1.00    Average packs/day: 1 pack/day for 60.7 years (60.7 ttl pk-yrs)    Types: Cigarettes    Start date: 1965   Smokeless tobacco: Never   Tobacco comments:     patches and pills discussed   Vaping Use   Vaping status: Never Used  Substance and Sexual Activity   Alcohol use: No   Drug use: No   Sexual activity: Not Currently  Other Topics Concern   Not on file  Social History Narrative   Not on file   Social Drivers of Health   Financial Resource Strain: Low Risk  (09/10/2024)   Overall Financial Resource Strain (CARDIA)    Difficulty of Paying Living Expenses: Not hard at all  Food Insecurity: No Food Insecurity (09/10/2024)   Hunger Vital Sign    Worried About Running Out of Food in the Last Year: Never true    Ran Out of Food in the Last Year: Never true  Transportation Needs: No Transportation Needs (09/10/2024)   PRAPARE - Administrator, Civil Service (Medical): No    Lack of Transportation (Non-Medical): No  Physical Activity: Sufficiently Active (09/10/2024)   Exercise Vital Sign    Days of Exercise per Week: 7 days    Minutes of Exercise per Session: 40 min  Stress: No Stress Concern Present (09/10/2024)   Angelica Walker of Occupational Health - Occupational Stress  Questionnaire    Feeling of Stress: Not at all  Social Connections: Socially Isolated (09/10/2024)   Social Connection and Isolation Panel    Frequency of Communication with Friends and Family: More than three times a week    Frequency of Social Gatherings with Friends and Family: More than three times a week    Attends Religious Services: Never    Database administrator or Organizations: No    Attends Engineer, structural: Never    Marital Status: Divorced    Tobacco Counseling Ready to quit: Not Answered Counseling given: Not Answered Tobacco comments: patches and pills discussed     Clinical Intake:  Pre-visit preparation completed: Yes  Pain : No/denies pain     BMI - recorded: 23.83 Nutritional Status: BMI of 19-24  Normal Nutritional Risks: None Diabetes: No  Lab Results  Component Value Date   HGBA1C 5.8 (H)  10/29/2023   HGBA1C 5.9 (H) 03/21/2023   HGBA1C 5.9 (H) 09/28/2022     How often do you need to have someone help you when you read instructions, pamphlets, or other written materials from your doctor or pharmacy?: 1 - Never  Interpreter Needed?: No  Information entered by :: Vina Ned, CMA   Activities of Daily Living     09/10/2024   10:11 AM  In your present state of health, do you have any difficulty performing the following activities:  Hearing? 0  Vision? 0  Difficulty concentrating or making decisions? 1  Walking or climbing stairs? 0  Dressing or bathing? 0  Doing errands, shopping? 0  Preparing Food and eating ? N  Using the Toilet? N  In the past six months, have you accidently leaked urine? N  Do you have problems with loss of bowel control? N  Managing your Medications? Y  Comment daughter fills pill box  Managing your Finances? Y  Comment daughter helps manage finances  Housekeeping or managing your Housekeeping? N    Patient Care Team: Kotturi, Vinay K, MD as PCP - General (Family Medicine) Melanee Annah BROCKS, MD as Consulting Physician (Oncology) Ashley Soulier, DPM as Referring Physician (Podiatry)  I have updated your Care Teams any recent Medical Services you may have received from other providers in the past year.     Assessment:   This is a routine wellness examination for Angelica Walker.  Hearing/Vision screen Hearing Screening - Comments:: Denies hearing loss  Vision Screening - Comments:: Needs routine eye exam, included list of eye doctors in AVS   Goals Addressed             This Visit's Progress    Patient Stated       Start cutting down on smoking       Depression Screen     09/10/2024   10:23 AM 06/12/2024    3:56 PM 03/13/2024    2:49 PM 02/28/2024    2:08 PM 09/11/2023    4:05 PM 09/05/2023    1:51 PM 07/31/2023    1:11 PM  PHQ 2/9 Scores  PHQ - 2 Score 0 3 0 0 0 0 0  PHQ- 9 Score 0 8  5 0 0 3    Fall Risk     09/10/2024    10:27 AM 06/12/2024    3:56 PM 03/13/2024    2:49 PM 02/28/2024    2:07 PM 09/11/2023    4:04 PM  Fall Risk   Falls in the past year? 0 0 0 0 0  Number falls in past yr: 0 0 0 0 0  Injury with Fall? 0 0 0 0 0  Risk for fall due to : No Fall Risks No Fall Risks  No Fall Risks Impaired balance/gait  Follow up Falls evaluation completed Falls evaluation completed  Falls evaluation completed Falls evaluation completed;Falls prevention discussed;Education provided    MEDICARE RISK AT HOME:  Medicare Risk at Home Any stairs in or around the home?: Yes If so, are there any without handrails?: No Home free of loose throw rugs in walkways, pet beds, electrical cords, etc?: Yes Adequate lighting in your home to reduce risk of falls?: Yes Life alert?: No Use of a cane, walker or w/c?: No Grab bars in the bathroom?: No Shower chair or bench in shower?: Yes Elevated toilet seat or a handicapped toilet?: No  TIMED UP AND GO:  Was the test performed?  No  Cognitive Function: 6CIT completed        09/10/2024   10:28 AM 09/05/2023    1:55 PM 08/30/2022    3:40 PM 03/13/2018   11:53 AM  6CIT Screen  What Year? 0 points 0 points 4 points 0 points  What month? 0 points 0 points 0 points 0 points  What time? 0 points 3 points 0 points 3 points  Count back from 20 4 points 0 points 4 points 0 points  Months in reverse 2 points 4 points 0 points 0 points  Repeat phrase 2 points 10 points 0 points 4 points  Total Score 8 points 17 points 8 points 7 points    Immunizations Immunization History  Administered Date(s) Administered   Fluad Quad(high Dose 65+) 10/07/2020, 09/27/2021, 09/28/2022   PFIZER(Purple Top)SARS-COV-2 Vaccination 02/20/2020, 03/12/2020, 10/04/2020, 04/07/2021   Pneumococcal Conjugate-13 02/07/2017   Pneumococcal Polysaccharide-23 12/21/2014   Tdap 12/21/2014    Screening Tests Health Maintenance  Topic Date Due   Zoster Vaccines- Shingrix (1 of 2) Never done    OPHTHALMOLOGY EXAM  06/14/2021   DEXA SCAN  02/22/2022   Mammogram  04/27/2023   HEMOGLOBIN A1C  04/27/2024   COVID-19 Vaccine (5 - 2025-26 season) 08/18/2024   Influenza Vaccine  12/24/2024 (Originally 07/18/2024)   Diabetic kidney evaluation - Urine ACR  03/20/2028 (Originally 04/19/1959)   Diabetic kidney evaluation - eGFR measurement  10/28/2024   DTaP/Tdap/Td (2 - Td or Tdap) 12/21/2024   Medicare Annual Wellness (AWV)  09/10/2025   Pneumococcal Vaccine: 50+ Years  Completed   HPV VACCINES  Aged Out   Meningococcal B Vaccine  Aged Out   Lung Cancer Screening  Discontinued   FOOT EXAM  Discontinued   Colonoscopy  Discontinued    Health Maintenance Items Addressed: Mammogram ordered, DEXA ordered, See Nurse Notes at the end of this note  Additional Screening:  Vision Screening: Recommended annual ophthalmology exams for early detection of glaucoma and other disorders of the eye. Is the patient up to date with their annual eye exam?  No  Who is the provider or what is the name of the office in which the patient attends annual eye exams? Included a list of eye doctors in AVS  Dental Screening: Recommended annual dental exams for proper oral hygiene  Community Resource Referral / Chronic Care Management: CRR required this visit?  No   CCM required this visit?  No   Plan:    I have personally reviewed and noted the following in the patient's chart:   Medical and social history Use of alcohol, tobacco or  illicit drugs  Current medications and supplements including opioid prescriptions. Patient is not currently taking opioid prescriptions. Functional ability and status Nutritional status Physical activity Advanced directives List of other physicians Hospitalizations, surgeries, and ER visits in previous 12 months Vitals Screenings to include cognitive, depression, and falls Referrals and appointments  In addition, I have reviewed and discussed with patient certain  preventive protocols, quality metrics, and best practice recommendations. A written personalized care plan for preventive services as well as general preventive health recommendations were provided to patient.   Vina Ned, CMA   09/10/2024   After Visit Summary: (Mail) Due to this being a telephonic visit, the after visit summary with patients personalized plan was offered to patient via mail   Notes:  6 CIT Score - 8 Patient's daughter, Angelica Walker assisted with today's visit Scheduled transfer of care OV for 09/15/24 viviane Molt pt) Placed orders for MMG and DEXA scan Needs routine eye exam. Included list of eye doctors in AVS Declined flu and covid vaccines

## 2024-09-10 NOTE — Patient Instructions (Addendum)
 [name],  Thank you for taking the time for your Medicare Wellness Visit. I appreciate your continued commitment to your health goals. Please review the care plan we discussed, and feel free to reach out if I can assist you further.  Medicare recommends these wellness visits once per year to help you and your care team stay ahead of potential health issues. These visits are designed to focus on prevention, allowing your provider to concentrate on managing your acute and chronic conditions during your regular appointments.  Please note that Annual Wellness Visits do not include a physical exam. Some assessments may be limited, especially if the visit was conducted virtually. If needed, we may recommend a separate in-person follow-up with your provider.  Ongoing Care Seeing your primary care provider every 3 to 6 months helps us  monitor your health and provide consistent, personalized care.   Referrals If a referral was made during today's visit and you haven't received any updates within two weeks, please contact the referred provider directly to check on the status.  Recommended Screenings:  Call to schedule a routine eye exam. I have included a list of eye doctors in the area.  Please call to schedule your mammogram and bone density scan:  [facility] Address: [address], [place], [place] Phone: [phone]  [facility] [address], [address] [place], [place] [address] Phone: [phone]     Health Maintenance  Topic Date Due   Zoster (Shingles) Vaccine (1 of 2) Never done   Eye exam for diabetics  06/14/2021   DEXA scan (bone density measurement)  02/22/2022   Breast Cancer Screening  04/27/2023   Hemoglobin A1C  04/27/2024   COVID-19 Vaccine (5 - 2025-26 season) 08/18/2024   Flu Shot  12/24/2024*   Yearly kidney health urinalysis for diabetes  03/20/2028*   Yearly kidney function blood test  for diabetes  10/28/2024   DTaP/Tdap/Td vaccine (2 - Td or Tdap) 12/21/2024   Medicare Annual Wellness Visit  09/10/2025   Pneumococcal Vaccine for age over 51  Completed   HPV Vaccine  Aged Out   Meningitis B Vaccine  Aged Out   Screening for Lung Cancer  Discontinued   Complete foot exam   Discontinued   Colon Cancer Screening  Discontinued  *Topic was postponed. The date shown is not the original due date.       09/10/2024   10:25 AM  Advanced Directives  Does Patient Have a Medical Advance Directive? No  Would patient like information on creating a medical advance directive? Yes (MAU/Ambulatory/Procedural Areas - Information given)   Advance Care Planning is important because it: Ensures you receive medical care that aligns with your values, goals, and preferences. Provides guidance to your family and loved ones, reducing the emotional burden of decision-making during critical moments.  Vision: Annual vision screenings are recommended for early detection of glaucoma, cataracts, and diabetic retinopathy. These exams can also reveal signs of chronic conditions such as diabetes and high blood pressure.  Dental: Annual dental screenings help detect early signs of oral cancer, gum disease, and other conditions linked to overall health, including heart disease and diabetes.  Please see the attached documents for additional preventive care recommendations.   There are several Eye Doctors in your area. Here are a few that usually accept all insurance types:  [facility] [address] [place], [place] [address] Phone: [phone]  [facility] [address] [place], [place] [address] Phone: [phone]  [facility] [address] [place], [place] [address] Phone: [phone]  [facility] [address] [place], [place] [address] Phone: [phone]  [facility] [address] [place], [place] [address] Phone: [phone]  [facility] [address] [place], [place] [address] Phone: [phone]  Please let us  know if you require a referral for an eye exam appointment. Thank you!    Fall Prevention in the Home, Adult Falls can cause injuries and affect people of all ages. There are many simple things that you can do to make your home safe and to help prevent falls. If you need it, ask for help making these changes. What actions can I take to prevent falls? General information Use good lighting in all rooms. Make sure to: Replace any light bulbs that burn out. Turn on lights if it is dark and use night-lights. Keep items that you use often in easy-to-reach places. Lower the shelves around your home if needed. Move furniture so that there are clear paths around it. Do not keep throw rugs or other things on the floor that can make you trip. If any of your floors are uneven, fix them. Add color or contrast paint or tape to clearly mark and help you see: Grab bars or handrails. First and last steps of staircases. Where the edge of each step is. If you use a ladder or stepladder: Make sure that it is fully opened. Do not climb a closed ladder. Make sure the sides of the ladder are locked in place. Have someone hold the ladder while you use it. Know where your pets are as you move through your home. What can I do in the bathroom?     Keep the floor dry. Clean up any water that is on the floor right away. Remove soap buildup in the bathtub or shower. Buildup makes bathtubs and showers slippery. Use non-skid mats or decals on the floor of the bathtub or shower. Attach bath mats securely with double-sided, non-slip rug tape. If you need to sit down while you are in the shower, use a non-slip stool. Install grab bars by the toilet and in the bathtub and shower. Do not use towel bars as grab bars. What can I do in the bedroom? Make sure that you have a light by your bed that is easy to reach. Do not use any sheets or blankets on  your bed that hang to the floor. Have a firm bench or chair with side arms that you can use for support when you get dressed. What can I do in the kitchen? Clean up any spills right away. If you need to reach something above you, use a sturdy step stool that has a grab bar. Keep electrical cables out of the way. Do not use floor polish or wax that makes floors slippery. What can I do with my stairs? Do not leave anything on the stairs. Make sure that you have a light switch at the top and the bottom of the stairs. Have them installed if you do not have them. Make sure that there are handrails on both sides of the stairs. Fix handrails that are broken or loose. Make sure that handrails are as long as the staircases. Install non-slip stair treads on all stairs in your home if they do not have carpet. Avoid having throw rugs at the top or bottom of stairs, or secure the rugs with  carpet tape to prevent them from moving. Choose a carpet design that does not hide the edge of steps on the stairs. Make sure that carpet is firmly attached to the stairs. Fix any carpet that is loose or worn. What can I do on the outside of my home? Use bright outdoor lighting. Repair the edges of walkways and driveways and fix any cracks. Clear paths of anything that can make you trip, such as tools or rocks. Add color or contrast paint or tape to clearly mark and help you see high doorway thresholds. Trim any bushes or trees on the main path into your home. Check that handrails are securely fastened and in good repair. Both sides of all steps should have handrails. Install guardrails along the edges of any raised decks or porches. Have leaves, snow, and ice cleared regularly. Use sand, salt, or ice melt on walkways during winter months if you live where there is ice and snow. In the garage, clean up any spills right away, including grease or oil spills. What other actions can I take? Review your medicines with your  health care provider. Some medicines can make you confused or feel dizzy. This can increase your chance of falling. Wear closed-toe shoes that fit well and support your feet. Wear shoes that have rubber soles and low heels. Use a cane, walker, scooter, or crutches that help you move around if needed. Talk with your provider about other ways that you can decrease your risk of falls. This may include seeing a physical therapist to learn to do exercises to improve movement and strength. Where to find more information Centers for Disease Control and Prevention, STEADI: TonerPromos.no [employer] on Aging: BaseRingTones.pl National Institute on Aging: BaseRingTones.pl Contact a health care provider if: You are afraid of falling at home. You feel weak, drowsy, or dizzy at home. You fall at home. Get help right away if you: Lose consciousness or have trouble moving after a fall. Have a fall that causes a head injury. These symptoms may be an emergency. Get help right away. Call 911. Do not wait to see if the symptoms will go away. Do not drive yourself to the hospital. This information is not intended to replace advice given to you by your health care provider. Make sure you discuss any questions you have with your health care provider. Document Revised: 08/07/2022 Document Reviewed: 08/07/2022 Elsevier Patient Education  2024 [employer].

## 2024-09-15 ENCOUNTER — Ambulatory Visit (INDEPENDENT_AMBULATORY_CARE_PROVIDER_SITE_OTHER): Admitting: Family Medicine

## 2024-09-15 ENCOUNTER — Encounter: Payer: Self-pay | Admitting: Family Medicine

## 2024-09-15 VITALS — BP 132/78 | HR 85 | Ht 60.0 in | Wt 116.0 lb

## 2024-09-15 DIAGNOSIS — Z23 Encounter for immunization: Secondary | ICD-10-CM | POA: Diagnosis not present

## 2024-09-15 DIAGNOSIS — J410 Simple chronic bronchitis: Secondary | ICD-10-CM | POA: Diagnosis not present

## 2024-09-15 DIAGNOSIS — M542 Cervicalgia: Secondary | ICD-10-CM | POA: Diagnosis not present

## 2024-09-15 DIAGNOSIS — R7303 Prediabetes: Secondary | ICD-10-CM

## 2024-09-15 DIAGNOSIS — I10 Essential (primary) hypertension: Secondary | ICD-10-CM

## 2024-09-15 LAB — POCT GLYCOSYLATED HEMOGLOBIN (HGB A1C): Hemoglobin A1C: 5 % (ref 4.0–5.6)

## 2024-09-15 MED ORDER — ACETAMINOPHEN 500 MG PO TABS: 1000.0000 mg | ORAL_TABLET | Freq: Two times a day (BID) | ORAL | 1 refills | Status: AC | PRN

## 2024-09-15 NOTE — Progress Notes (Signed)
 Established Patient Office Visit  Subjective   Patient ID: Angelica Walker, female    DOB: 1941/04/11  Age: 83 y.o. MRN: [id number]  Chief Complaint  Patient presents with   Establish Care    Patient is here to establish care with new PCP     Assessment & Plan:   Problem List Items Addressed This Visit       Cardiovascular and Mediastinum   Hypertension, essential, benign     Respiratory   COPD (chronic obstructive pulmonary disease) (HCC)     Other   Prediabetes   Relevant Orders   POCT glycosylated hemoglobin (Hb A1C) (Completed)   Other Visit Diagnoses       Neck pain    -  Primary   Relevant Medications   acetaminophen  (TYLENOL ) 500 MG tablet     Encounter for immunization       Relevant Orders   Flu vaccine HIGH DOSE PF(Fluzone Trivalent) (Completed)     Assessment and Plan Assessment & Plan Neck pain Chronic neck pain. - Completed Medrol  dosepak. Continue Tylenol .  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 Currently receiving iron  infusions. Hemoglobin was 10.6 g/dL three weeks ago. Follow up Oncology.  Tobacco use disorder Chronic tobacco use disorder, currently smoking half a pack per day, reduced from one pack per day. Expressed interest in smoking cessation. - Provided information on 1-800-QUIT-NOW for free nicotine patches or gum. - Offered to send a prescription for nicotine patches to the pharmacy if needed.  Cognitive impairment Cognitive impairment with memory issues. Currently taking Aricept. No significant concerns about getting lost or performing daily activities independently.   HTN:  stable with current regimen. Continue lisinopril  10 mg every day.   Copd:  asymptomatic. Encouraged quitting smoking.    Return in about 6 months (around 03/15/2025) for chronic follow up with PCP.   HPI Discussed the use of AI scribe software for clinical note transcription with the patient, who gave  verbal consent to proceed.  History of Present Illness Angelica Walker is an 83 year old female with iron  deficiency anemia who presents with neck pain. She is accompanied by her daughter, [name].  She experiences persistent neck pain described as 'creaky' located in the neck and ear area. She previously completed a course of steroids for this pain, but it persists. She manages it with over-the-counter medications such as Tylenol .  She has a history of iron  deficiency anemia and has been receiving iron  infusions at the cancer center since July 02, 2024. Her last recorded hemoglobin level was 10.6 three weeks ago. She also takes iron  tablets as part of her treatment regimen.  Her current medications include Tylenol , cholesterol medication, Pepto Bismol, calcium  tablets, vitamin B12, Aricept, glucosamine chondroitin, lisinopril , and antiprazole for acid reflux due to her ulcer. She also uses over-the-counter supplements such as fish oil, calcium , and vitamin D.  She has a history of smoking and currently smokes half a pack per day, reduced from a previous one pack per day. She wants to quit smoking.  Her memory is a concern, and she is on Aricept for this issue. She does not get lost and is able to perform daily activities such as dressing, eating, and bathing independently. No falls reported.  She reports poor vision and mentions needing new glasses. No issues with hearing and no blood in urine or stool.     Review of Systems  All other systems reviewed and  are negative.     Objective:     BP 132/78   Pulse 85   Ht 5' (1.524 m)   Wt 116 lb (52.6 kg)   SpO2 97%   BMI 22.65 kg/m    Physical Exam Vitals and nursing note reviewed.  Constitutional:      Appearance: Normal appearance.  HENT:     Head: Normocephalic.     Right Ear: External ear normal.     Left Ear: External ear normal.  Eyes:     Conjunctiva/sclera: Conjunctivae normal.  Cardiovascular:     Rate and Rhythm:  Normal rate.  Pulmonary:     Effort: Pulmonary effort is normal. No respiratory distress.  Abdominal:     Palpations: Abdomen is soft.  Musculoskeletal:        General: Normal range of motion.  Skin:    General: Skin is warm.  Neurological:     Mental Status: She is alert and oriented to person, place, and time.  Psychiatric:        Mood and Affect: Mood normal.      Results for orders placed or performed in visit on 09/15/24  POCT glycosylated hemoglobin (Hb A1C)  Result Value Ref Range   Hemoglobin A1C 5.0 4.0 - 5.6 %      The ASCVD Risk score (Arnett DK, et al., 2019) failed to calculate for the following reasons:   The 2019 ASCVD risk score is only valid for ages 70 to 84      Ladoris MARLA Ny, MD

## 2024-09-15 NOTE — Patient Instructions (Signed)
 Call [facility] to schedule your mammogram at [phone].

## 2024-09-22 ENCOUNTER — Other Ambulatory Visit: Payer: Self-pay | Admitting: Family Medicine

## 2024-09-22 DIAGNOSIS — M542 Cervicalgia: Secondary | ICD-10-CM

## 2024-09-24 NOTE — Telephone Encounter (Signed)
 Duplicate request, too soon for refill.  Requested Prescriptions  Pending Prescriptions Disp Refills   Acetaminophen  Extra Strength 500 MG TABS [Pharmacy Med Name: ACETAMINOPHEN  EXTRA STRENGTH 500MG  TABLET] 30 tablet 1    Sig: TAKE TWO (2) TABLETS BY MOUTH TWICE DAILY AS NEEDED     Over the counter: OTC - acetaminophen  Passed - 09/24/2024 12:10 PM      Passed - Cr in normal range and within 360 days    Creatinine, Ser  Date Value Ref Range Status  10/29/2023 0.74 0.57 - 1.00 mg/dL Final         Passed - ALT in normal range and within 360 days    ALT  Date Value Ref Range Status  10/29/2023 7 0 - 32 IU/L Final         Passed - AST in normal range and within 360 days    AST  Date Value Ref Range Status  10/29/2023 13 0 - 40 IU/L Final         Passed - Valid encounter within last 12 months    Recent Outpatient Visits           1 week ago Neck pain   [place] [facility] Kotturi, Vinay K, MD   3 months ago Neck pain   [place] [facility], Vinay K, MD   6 months ago Hypertension, essential, benign   [place] [facility] Cathryne JAYSON, MD   6 months ago Hyperlipidemia, unspecified hyperlipidemia type   [facility] Cathryne JAYSON, MD

## 2024-09-26 ENCOUNTER — Other Ambulatory Visit: Payer: Self-pay | Admitting: Family Medicine

## 2024-09-26 DIAGNOSIS — M542 Cervicalgia: Secondary | ICD-10-CM

## 2024-09-30 NOTE — Telephone Encounter (Signed)
 Too soon for refill, [facility] 09/15/24.  Requested Prescriptions  Pending Prescriptions Disp Refills   Acetaminophen  Extra Strength 500 MG TABS [Pharmacy Med Name: ACETAMINOPHEN  EXTRA STRENGTH 500MG  TABLET] 30 tablet 1    Sig: TAKE TWO (2) TABLETS BY MOUTH TWICE DAILY AS NEEDED     Over the counter: OTC - acetaminophen  Passed - 09/30/2024 10:40 AM      Passed - Cr in normal range and within 360 days    Creatinine, Ser  Date Value Ref Range Status  10/29/2023 0.74 0.57 - 1.00 mg/dL Final         Passed - ALT in normal range and within 360 days    ALT  Date Value Ref Range Status  10/29/2023 7 0 - 32 IU/L Final         Passed - AST in normal range and within 360 days    AST  Date Value Ref Range Status  10/29/2023 13 0 - 40 IU/L Final         Passed - Valid encounter within last 12 months    Recent Outpatient Visits           2 weeks ago Neck pain   [place] [facility] Kotturi, Vinay K, MD   3 months ago Neck pain   [place] [facility], Vinay K, MD   6 months ago Hypertension, essential, benign   [place] [facility] Cathryne JAYSON, MD   7 months ago Hyperlipidemia, unspecified hyperlipidemia type   [facility] Cathryne JAYSON, MD

## 2024-10-08 ENCOUNTER — Other Ambulatory Visit (INDEPENDENT_AMBULATORY_CARE_PROVIDER_SITE_OTHER): Admitting: Family Medicine

## 2024-10-08 DIAGNOSIS — H9313 Tinnitus, bilateral: Secondary | ICD-10-CM

## 2024-10-08 DIAGNOSIS — I10 Essential (primary) hypertension: Secondary | ICD-10-CM

## 2024-10-09 ENCOUNTER — Ambulatory Visit: Admitting: Family Medicine

## 2024-10-09 ENCOUNTER — Other Ambulatory Visit: Payer: Self-pay

## 2024-10-09 ENCOUNTER — Encounter: Payer: Self-pay | Admitting: Family Medicine

## 2024-10-09 VITALS — BP 122/80 | HR 87 | Temp 98.1°F | Ht 60.0 in | Wt 113.0 lb

## 2024-10-09 DIAGNOSIS — D509 Iron deficiency anemia, unspecified: Secondary | ICD-10-CM

## 2024-10-09 DIAGNOSIS — K921 Melena: Secondary | ICD-10-CM

## 2024-10-09 NOTE — Progress Notes (Signed)
   Established Patient Office Visit  Subjective   Patient ID: Angelica Walker, female    DOB: March 03, 1941  Age: 83 y.o. MRN: [id number]  Chief Complaint  Patient presents with   Diarrhea    Black stool, x 3 times a day, x 1 2 weeks     Assessment & Plan:   Problem List Items Addressed This Visit   None Visit Diagnoses       Black stool    -  Primary   Relevant Orders   CBC with Differential       No follow-ups on file.   HPI    ROS    Objective:     BP 122/80   Pulse 87   Temp 98.1 F (36.7 C) (Oral)   Ht 5' (1.524 m)   Wt 113 lb (51.3 kg)   SpO2 97%   BMI 22.07 kg/m  {Vitals History (Optional):23777}  Physical Exam   No results found for any visits on 10/09/24.  {Labs (Optional):23779}  The ASCVD Risk score (Arnett DK, et al., 2019) failed to calculate for the following reasons:   The 2019 ASCVD risk score is only valid for ages 42 to 22      Angelica MARLA Ny, MD

## 2024-10-10 ENCOUNTER — Ambulatory Visit: Payer: Self-pay | Admitting: Family Medicine

## 2024-10-10 ENCOUNTER — Ambulatory Visit: Admitting: Family Medicine

## 2024-10-10 LAB — CBC WITH DIFFERENTIAL/PLATELET
Basophils Absolute: 0.1 x10E3/uL (ref 0.0–0.2)
Basos: 1 %
EOS (ABSOLUTE): 0.1 x10E3/uL (ref 0.0–0.4)
Eos: 1 %
Hematocrit: 40.5 % (ref 34.0–46.6)
Hemoglobin: 12.9 g/dL (ref 11.1–15.9)
Immature Grans (Abs): 0 x10E3/uL (ref 0.0–0.1)
Immature Granulocytes: 0 %
Lymphocytes Absolute: 2.1 x10E3/uL (ref 0.7–3.1)
Lymphs: 33 %
MCH: 29.5 pg (ref 26.6–33.0)
MCHC: 31.9 g/dL (ref 31.5–35.7)
MCV: 93 fL (ref 79–97)
Monocytes Absolute: 0.6 x10E3/uL (ref 0.1–0.9)
Monocytes: 10 %
Neutrophils Absolute: 3.4 x10E3/uL (ref 1.4–7.0)
Neutrophils: 55 %
Platelets: 259 x10E3/uL (ref 150–450)
RBC: 4.38 x10E6/uL (ref 3.77–5.28)
RDW: 17.1 % — ABNORMAL HIGH (ref 11.7–15.4)
WBC: 6.3 x10E3/uL (ref 3.4–10.8)

## 2024-10-10 LAB — COMPREHENSIVE METABOLIC PANEL WITH GFR
ALT: 13 IU/L (ref 0–32)
AST: 19 IU/L (ref 0–40)
Albumin: 4.6 g/dL (ref 3.7–4.7)
Alkaline Phosphatase: 75 IU/L (ref 48–129)
BUN/Creatinine Ratio: 17 (ref 12–28)
BUN: 16 mg/dL (ref 8–27)
Bilirubin Total: 0.2 mg/dL (ref 0.0–1.2)
CO2: 26 mmol/L (ref 20–29)
Calcium: 9.6 mg/dL (ref 8.7–10.3)
Chloride: 102 mmol/L (ref 96–106)
Creatinine, Ser: 0.92 mg/dL (ref 0.57–1.00)
Globulin, Total: 2.1 g/dL (ref 1.5–4.5)
Glucose: 98 mg/dL (ref 70–99)
Potassium: 4.2 mmol/L (ref 3.5–5.2)
Sodium: 141 mmol/L (ref 134–144)
Total Protein: 6.7 g/dL (ref 6.0–8.5)
eGFR: 63 mL/min/1.73 (ref >59)

## 2024-10-16 ENCOUNTER — Other Ambulatory Visit: Payer: Self-pay | Admitting: Gastroenterology

## 2024-10-16 DIAGNOSIS — S0990XA Unspecified injury of head, initial encounter: Secondary | ICD-10-CM

## 2024-10-16 DIAGNOSIS — K921 Melena: Secondary | ICD-10-CM

## 2024-10-20 ENCOUNTER — Encounter: Payer: Self-pay | Admitting: Pharmacist

## 2024-10-20 NOTE — Progress Notes (Signed)
   10/20/2024  Patient ID: Angelica Walker, female   DOB: [birth year], 83 y.o.   MRN: [id number]  This patient is appearing on a report for being at risk of failing the adherence measure for hypertension (ACEi/ARB) medications this calendar year.   Medication: lisinopril  10 mg Last fill date: 10/09/2024 for 90 day supply  Insurance report was not up to date. No action needed at this time.   Sharyle Sia, PharmD, [facility] [phone]

## 2024-10-24 ENCOUNTER — Encounter: Admission: RE | Payer: Self-pay | Source: Home / Self Care

## 2024-10-24 ENCOUNTER — Ambulatory Visit: Admission: RE | Admit: 2024-10-24

## 2024-10-24 SURGERY — EGD (ESOPHAGOGASTRODUODENOSCOPY)
Anesthesia: General

## 2024-10-28 ENCOUNTER — Ambulatory Visit
Admission: RE | Admit: 2024-10-28 | Discharge: 2024-10-28 | Disposition: A | Discharge status: Home / Self Care | Source: Ambulatory Visit | Attending: Gastroenterology | Admitting: Gastroenterology

## 2024-10-28 ENCOUNTER — Ambulatory Visit (INDEPENDENT_AMBULATORY_CARE_PROVIDER_SITE_OTHER): Admitting: Family Medicine

## 2024-10-28 ENCOUNTER — Encounter: Payer: Self-pay | Admitting: Family Medicine

## 2024-10-28 VITALS — BP 140/86 | HR 86 | Ht 60.0 in | Wt 116.0 lb

## 2024-10-28 DIAGNOSIS — R42 Dizziness and giddiness: Secondary | ICD-10-CM | POA: Diagnosis not present

## 2024-10-28 DIAGNOSIS — S0990XA Unspecified injury of head, initial encounter: Secondary | ICD-10-CM | POA: Insufficient documentation

## 2024-10-28 DIAGNOSIS — K921 Melena: Secondary | ICD-10-CM | POA: Insufficient documentation

## 2024-10-28 DIAGNOSIS — R413 Other amnesia: Secondary | ICD-10-CM

## 2024-10-28 DIAGNOSIS — M542 Cervicalgia: Secondary | ICD-10-CM

## 2024-10-28 NOTE — Progress Notes (Signed)
 Established Patient Office Visit  Patient ID: Angelica Walker, female    DOB: October 02, 1941  Age: 83 y.o. MRN: [id number] PCP: Angelica Sine K, MD  Chief Complaint  Patient presents with   Dizziness    Comes and goes,  3 - 4 days, neck pain    Referral    Right foot bunion    Subjective:     HPI  Discussed the use of AI scribe software for clinical note transcription with the patient, who gave verbal consent to proceed.  History of Present Illness Angelica Walker is an 83 year old female who presents with dizziness and neck pain. She is accompanied by her daughter, Angelica Walker.  She experiences persistent dizziness described as a sensation of the room spinning, which is more pronounced when standing. She feels lightheaded and 'loopy' both when standing and sitting. No recent falls, changes in heart rhythm, or palpitations. She denies chest pain or shortness of breath. No nausea, vomiting, or significant headaches.  She describes her neck as 'floppy' with a sensation of 'cracking' and pain. This has been a long-standing issue, though she is unsure if it is worsening. No recent trauma or falls that could have caused these symptoms.  She has a history of anemia and was seen by a gastroenterologist on October 29th for this issue. She was also supposed to have a head CT due to a bruise on her right temporal area, which she does not recall obtaining. She has been taking pantoprazole  for a history of ulcers and Aricept for memory issues.  Her daughter, Angelica Walker, confirms that she has been experiencing memory issues, and there is a concern about her ability to recall recent events, such as the current month or year. She lives independently and denies any recent falls despite feeling lightheaded.     Review of Systems  All other systems reviewed and are negative.     Objective:     BP (!) 140/86   Pulse 86   Ht 5' (1.524 m)   Wt 116 lb (52.6 kg)   SpO2 98%   BMI 22.65 kg/m  BP Readings  from Last 3 Encounters:  10/28/24 (!) 140/86  10/09/24 122/80  09/15/24 132/78      Physical Exam Vitals and nursing note reviewed.  Constitutional:      Appearance: Normal appearance.  HENT:     Head: Normocephalic.     Right Ear: External ear normal.     Left Ear: External ear normal.  Eyes:     Conjunctiva/sclera: Conjunctivae normal.  Cardiovascular:     Rate and Rhythm: Normal rate.  Pulmonary:     Effort: Pulmonary effort is normal. No respiratory distress.  Abdominal:     Palpations: Abdomen is soft.  Musculoskeletal:        General: Normal range of motion.  Skin:    General: Skin is warm.  Neurological:     Mental Status: She is alert and oriented to person, place, and time.  Psychiatric:        Mood and Affect: Mood normal.     Physical Exam     No results found for any visits on 10/28/24.  Last CBC Lab Results  Component Value Date   WBC 6.3 10/09/2024   HGB 12.9 10/09/2024   HCT 40.5 10/09/2024   MCV 93 10/09/2024   MCH 29.5 10/09/2024   RDW 17.1 (H) 10/09/2024   PLT 259 10/09/2024   Last metabolic panel Lab Results  Component  Value Date   GLUCOSE 98 10/09/2024   NA 141 10/09/2024   Walker 4.2 10/09/2024   CL 102 10/09/2024   CO2 26 10/09/2024   BUN 16 10/09/2024   CREATININE 0.92 10/09/2024   EGFR 63 10/09/2024   CALCIUM  9.6 10/09/2024   PHOS 3.7 09/28/2022   PROT 6.7 10/09/2024   ALBUMIN 4.6 10/09/2024   LABGLOB 2.1 10/09/2024   AGRATIO 1.5 12/19/2017   BILITOT 0.2 10/09/2024   ALKPHOS 75 10/09/2024   AST 19 10/09/2024   ALT 13 10/09/2024   ANIONGAP 7 07/14/2019   Last thyroid  functions Lab Results  Component Value Date   TSH 1.320 01/30/2023   T4TOTAL 7.7 01/30/2023      The ASCVD Risk score (Arnett DK, et al., 2019) failed to calculate for the following reasons:   The 2019 ASCVD risk score is only valid for ages 51 to 28    Assessment & Plan:   Problem List Items Addressed This Visit   None   Assessment and  Plan Assessment & Plan Dizziness and giddiness Chronic dizziness and giddiness, more noticeable when standing. Differential includes cardiovascular causes and possible head injury due to temporal bruise. - Referred to cardiology for evaluation. - Advised ER visit for head CT to rule out intracranial bleeding.   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 On Aricept. - Referred to neurology for further evaluation. - Consider brain MRI as part of neurology evaluation.  Neck pain Chronic neck pain with cracking sounds, possibly due to arthritis. - Ordered neck x-ray to evaluate for arthritis or other structural issues.  Hypertension Blood pressure slightly elevated, managed with Lisinopril .    No follow-ups on file.    Angelica Walker Inesha Sow, MD [facility] at [facility]  I personally spent a total of 45 minutes in the care of the patient today including preparing to see the patient, getting/reviewing separately obtained history, performing a medically appropriate exam/evaluation, counseling and educating, placing orders, referring and communicating with other health care professionals, and documenting clinical information in the EHR.

## 2024-10-28 NOTE — Patient Instructions (Addendum)
 Call GI back and Reschedule Upper Endoscopy ASAP.  -  [phone]  Go straight to [address], [place], [place] [address]. [facility]. 10/28/2024 4:30 PM

## 2024-11-17 ENCOUNTER — Other Ambulatory Visit: Payer: Self-pay

## 2024-11-17 DIAGNOSIS — E785 Hyperlipidemia, unspecified: Secondary | ICD-10-CM

## 2024-11-17 MED ORDER — ATORVASTATIN CALCIUM 10 MG PO TABS: 10.0000 mg | ORAL_TABLET | Freq: Every day | ORAL | 1 refills | Status: AC

## 2024-11-24 ENCOUNTER — Other Ambulatory Visit: Payer: Self-pay

## 2024-11-24 ENCOUNTER — Ambulatory Visit: Admitting: Cardiology

## 2024-11-24 DIAGNOSIS — D500 Iron deficiency anemia secondary to blood loss (chronic): Secondary | ICD-10-CM

## 2024-11-25 ENCOUNTER — Inpatient Hospital Stay: Attending: Oncology

## 2024-11-25 DIAGNOSIS — F17210 Nicotine dependence, cigarettes, uncomplicated: Secondary | ICD-10-CM | POA: Insufficient documentation

## 2024-11-25 DIAGNOSIS — D500 Iron deficiency anemia secondary to blood loss (chronic): Secondary | ICD-10-CM

## 2024-11-25 DIAGNOSIS — D509 Iron deficiency anemia, unspecified: Secondary | ICD-10-CM | POA: Diagnosis present

## 2024-11-25 DIAGNOSIS — Z803 Family history of malignant neoplasm of breast: Secondary | ICD-10-CM | POA: Diagnosis not present

## 2024-11-25 LAB — CBC (CANCER CENTER ONLY)
HCT: 36.2 % (ref 36.0–46.0)
Hemoglobin: 11.9 g/dL — ABNORMAL LOW (ref 12.0–15.0)
MCH: 30.4 pg (ref 26.0–34.0)
MCHC: 32.9 g/dL (ref 30.0–36.0)
MCV: 92.3 fL (ref 80.0–100.0)
Platelet Count: 257 K/uL (ref 150–400)
RBC: 3.92 MIL/uL (ref 3.87–5.11)
RDW: 16.1 % — ABNORMAL HIGH (ref 11.5–15.5)
WBC Count: 6.9 K/uL (ref 4.0–10.5)
nRBC: 0 % (ref 0.0–0.2)

## 2024-11-25 LAB — IRON AND TIBC
Iron: 52 ug/dL (ref 28–170)
Saturation Ratios: 16 % (ref 10.4–31.8)
TIBC: 329 ug/dL (ref 250–450)
UIBC: 277 ug/dL

## 2024-11-25 LAB — FERRITIN: Ferritin: 123 ng/mL (ref 11–307)

## 2024-11-25 LAB — VITAMIN B12: Vitamin B-12: 249 pg/mL (ref 180–914)

## 2024-11-26 ENCOUNTER — Ambulatory Visit: Payer: Self-pay | Admitting: Oncology

## 2024-11-26 NOTE — Progress Notes (Deleted)
°  Cardiology Office Note   Date:  11/26/2024  ID:  Angelica Walker, DOB 24-Apr-1941, MRN [id number] PCP: Kotturi, Vinay K, MD  [facility] Providers Cardiologist:  None { Click to update primary MD,subspecialty MD or APP then REFRESH:1}    History of Present Illness Angelica Walker is a 83 y.o. female PMH tobacco use, MCI, HLD, HTN, reported COPD who presents for further evaluation management dizziness.  Seen by PCP for this issue 10/28/2024.  Reportedly worse with standing.  Last LDL 139 03/2023.  Relevant CVD History -CT chest 11/2022 severe aortic atherosclerosis and severe three-vessel coronary artery calcifications -Carotid Doppler 04/2020 without significant stenoses; there was reversal of left vertebral artery waveform which could be consistent with subclavian stenosis   ROS: Pt denies any chest discomfort, jaw pain, arm pain, palpitations, syncope, presyncope, orthopnea, PND, or LE edema.  Studies Reviewed I have independently reviewed the patient's ECG, recent CT scan, previous medical record, previous cardiovascular testing.  Physical Exam VS:  There were no vitals taken for this visit.       Wt Readings from Last 3 Encounters:  10/28/24 116 lb (52.6 kg)  10/09/24 113 lb (51.3 kg)  09/15/24 116 lb (52.6 kg)    GEN: No acute distress. NECK: No JVD; No carotid bruits. CARDIAC: ***RRR, no murmurs, rubs, gallops. RESPIRATORY:  Clear to auscultation. EXTREMITIES:  Warm and well-perfused. No edema.  ASSESSMENT AND PLAN Dizziness Near syncope HLD Aortic atherosclerosis CAC        {Are you ordering a CV Procedure (e.g. stress test, cath, DCCV, TEE, etc)?   Press F2        :[id number]}  Dispo: ***  Signed, Caron Poser, MD

## 2024-11-27 ENCOUNTER — Ambulatory Visit

## 2024-11-28 ENCOUNTER — Ambulatory Visit
Admission: EM | Admit: 2024-11-28 | Discharge: 2024-11-28 | Disposition: A | Discharge status: Home / Self Care | Attending: Emergency Medicine | Admitting: Emergency Medicine

## 2024-11-28 DIAGNOSIS — M546 Pain in thoracic spine: Secondary | ICD-10-CM

## 2024-11-28 DIAGNOSIS — R6889 Other general symptoms and signs: Secondary | ICD-10-CM

## 2024-11-28 LAB — POC COVID19/FLU A&B COMBO
Covid Antigen, POC: NEGATIVE
Influenza A Antigen, POC: NEGATIVE
Influenza B Antigen, POC: NEGATIVE

## 2024-11-28 MED ORDER — BACLOFEN 5 MG PO TABS: 5.0000 mg | ORAL_TABLET | Freq: Three times a day (TID) | ORAL | 0 refills | Status: AC | PRN

## 2024-11-28 NOTE — ED Provider Notes (Signed)
 [facility]    CSN: [id number] Arrival date & time: 11/28/24  1302      History   Chief Complaint Chief Complaint  Patient presents with   Dizziness    HPI Angelica Walker is a 83 y.o. female.   HPI  83 year old female with past medical history significant for multilevel degenerative disc disease, hypertension, hyperlipidemia, GERD, COPD, and anemia presents for evaluation of bodyaches and mid back pain that started yesterday.  She denies any urinary symptoms.  Past Medical History:  Diagnosis Date   Anemia    COPD (chronic obstructive pulmonary disease) (HCC)    GERD (gastroesophageal reflux disease)    Hyperlipidemia    Hypertension    Multilevel degenerative disc disease    Serum lipids high     Patient Active Problem List   Diagnosis Date Noted   Iron  deficiency anemia due to chronic blood loss 07/02/2018   Smoker 03/18/2018   RLS (restless legs syndrome) 01/18/2018   Anemia due to blood loss 12/20/2017   Prediabetes 12/19/2017   Hyperlipidemia, unspecified 12/19/2017   Hypertension, essential, benign 12/19/2017   B12 deficiency 12/19/2017   DDD (degenerative disc disease), lumbar 12/19/2017   Multiple gastric ulcers 12/19/2017   COPD (chronic obstructive pulmonary disease) (HCC) 12/21/2014    Past Surgical History:  Procedure Laterality Date   CATARACT EXTRACTION W/PHACO Left 06/29/2020   Procedure: CATARACT EXTRACTION PHACO AND INTRAOCULAR LENS PLACEMENT (IOC) LEFT 7.20 00:42.7;  Surgeon: Myrna Adine Anes, MD;  Location: [facility];  Service: Ophthalmology;  Laterality: Left;   CATARACT EXTRACTION W/PHACO Right 07/26/2020   Procedure: CATARACT EXTRACTION PHACO AND INTRAOCULAR LENS PLACEMENT (IOC) RIGHT 7.97  00:53.0;  Surgeon: Myrna Adine Anes, MD;  Location: [facility];  Service: Ophthalmology;  Laterality: Right;   COLONOSCOPY WITH PROPOFOL  N/A 12/07/2017   Procedure: COLONOSCOPY WITH PROPOFOL ;  Surgeon: Gaylyn Gladis PENNER, MD;  Location: [facility] END[facility]COPY;  Service: Endoscopy;  Laterality: N/A;   ESOPHAGOGASTRODUODENOSCOPY (EGD) WITH PROPOFOL  N/A 12/07/2017   Procedure: ESOPHAGOGASTRODUODENOSCOPY (EGD) WITH PROPOFOL ;  Surgeon: Gaylyn Gladis PENNER, MD;  Location: [facility] END[facility];  Service: Endoscopy;  Laterality: N/A;   ESOPHAGOGASTRODUODENOSCOPY (EGD) WITH PROPOFOL  N/A 03/21/2018   Procedure: ESOPHAGOGASTRODUODENOSCOPY (EGD) WITH PROPOFOL ;  Surgeon: Gaylyn Gladis PENNER, MD;  Location: [facility] ENDOSCOPY;  Service: Endoscopy;  Laterality: N/A;   ESOPHAGOGASTRODUODENOSCOPY (EGD) WITH PROPOFOL  N/A 07/22/2018   Procedure: ESOPHAGOGASTRODUODENOSCOPY (EGD) WITH PROPOFOL ;  Surgeon: Gaylyn Gladis PENNER, MD;  Location: [facility] ENDOSCOPY;  Service: Endoscopy;  Laterality: N/A;    OB History   No obstetric history on file.      Home Medications    Prior to Admission medications  Medication Sig Start Date End Date Taking? Authorizing Provider  Baclofen  5 MG TABS Take 1 tablet (5 mg total) by mouth 3 (three) times daily as needed. 11/28/24  Yes Bernardino Ditch, NP  acetaminophen  (TYLENOL ) 500 MG tablet Take 2 tablets (1,000 mg total) by mouth 2 (two) times daily as needed. 09/15/24   Kotturi, Vinay K, MD  atorvastatin  (LIPITOR) 10 MG tablet Take 1 tablet (10 mg total) by mouth daily. 11/17/24   Kotturi, Vinay K, MD  bismuth subsalicylate (PEPTO BISMOL) 262 MG/15ML suspension Take 30 mLs by mouth every 6 (six) hours as needed.    [provider]  Calcium  Carbonate-Vitamin D 600-200 MG-UNIT TABS Take by mouth.    [provider]  Cyanocobalamin (B-12) 1000 MCG TABS Take 1 tablet by mouth daily. 03/19/18   Loria Fallow, MD  donepezil (  ARICEPT) 5 MG tablet Take 5 mg by mouth at bedtime. 10/11/23   [provider]  Ferrous Sulfate  (IRON ) 28 MG TABS Take by mouth.    [provider]  GLUCOSAMINE-CHONDROITIN PO Take 3 tablets by mouth.    [provider]  lisinopril  (ZESTRIL ) 10 MG tablet Take 1 tablet  (10 mg total) by mouth daily. 10/09/24   Kotturi, Vinay K, MD  Omega-3 Fatty Acids ([employer] FISH OIL) 1200 MG CPDR Take 720 mg by mouth daily.    [provider]  pantoprazole  (PROTONIX ) 40 MG tablet Take 1 tablet (40 mg total) by mouth daily. 09/08/24   Kotturi, Vinay K, MD  traZODone  (DESYREL ) 50 MG tablet Take 1 tablet (50 mg total) by mouth at bedtime. 09/08/24   Kotturi, Vinay K, MD    Family History Family History  Problem Relation Age of Onset   Other Mother        unknown medical history   Other Father        unknown medical history   Breast cancer Maternal Aunt     Social History Social History[1]   Allergies   Simvastatin   Review of Systems Review of Systems  Constitutional:  Negative for fever.  Eyes:  Negative for visual disturbance.  Gastrointestinal:  Negative for nausea and vomiting.  Genitourinary:  Negative for dysuria, frequency, hematuria and urgency.  Musculoskeletal:  Positive for back pain.  Neurological:  Positive for dizziness. Negative for weakness, numbness and headaches.     Physical Exam Triage Vital Signs ED Triage Vitals  Encounter Vitals Group     BP 11/28/24 1320 125/72     Girls Systolic BP Percentile --      Girls Diastolic BP Percentile --      Boys Systolic BP Percentile --      Boys Diastolic BP Percentile --      Pulse Rate 11/28/24 1320 62     Resp 11/28/24 1320 16     Temp 11/28/24 1320 97.7 F (36.5 C)     Temp Source 11/28/24 1320 Oral     SpO2 11/28/24 1320 98 %     Weight 11/28/24 1318 115 lb (52.2 kg)     Height --      Head Circumference --      Peak Flow --      Pain Score 11/28/24 1318 6     Pain Loc --      Pain Education --      Exclude from Growth Chart --    No data found.  Updated Vital Signs BP 125/72 (BP Location: Left Arm)   Pulse 62   Temp 97.7 F (36.5 C) (Oral)   Resp 16   Wt 115 lb (52.2 kg)   SpO2 98%   BMI 22.46 kg/m   Visual Acuity Right Eye Distance:   Left Eye Distance:    Bilateral Distance:    Right Eye Near:   Left Eye Near:    Bilateral Near:     Physical Exam Vitals and nursing note reviewed.  Constitutional:      Appearance: Normal appearance. She is not ill-appearing.  HENT:     Head: Normocephalic and atraumatic.     Right Ear: Tympanic membrane, ear canal and external ear normal. There is no impacted cerumen.     Left Ear: Tympanic membrane, ear canal and external ear normal. There is no impacted cerumen.     Nose: Nose normal. No congestion or rhinorrhea.  Mouth/Throat:     Mouth: Mucous membranes are moist.     Pharynx: Oropharynx is clear. No oropharyngeal exudate or posterior oropharyngeal erythema.  Eyes:     Extraocular Movements: Extraocular movements intact.     Conjunctiva/sclera: Conjunctivae normal.     Pupils: Pupils are equal, round, and reactive to light.  Cardiovascular:     Rate and Rhythm: Normal rate and regular rhythm.     Pulses: Normal pulses.     Heart sounds: Normal heart sounds. No murmur heard.    No friction rub. No gallop.  Pulmonary:     Effort: Pulmonary effort is normal.     Breath sounds: Normal breath sounds. No wheezing, rhonchi or rales.  Skin:    General: Skin is warm and dry.     Capillary Refill: Capillary refill takes less than 2 seconds.     Findings: No erythema or rash.  Neurological:     General: No focal deficit present.     Mental Status: She is alert and oriented to person, place, and time.      UC Treatments / Results  Labs (all labs ordered are listed, but only abnormal results are displayed) Labs Reviewed  POC COVID19/FLU A&B COMBO - Normal    EKG   Radiology No results found.  Procedures Procedures (including critical care time)  Medications Ordered in UC Medications - No data to display  Initial Impression / Assessment and Plan / UC Course  I have reviewed the triage vital signs and the nursing notes.  Pertinent labs & imaging results that were available  during my care of the patient were reviewed by me and considered in my medical decision making (see chart for details).   Patient is a nontoxic-appearing 83 year old female presenting for evaluation of mid back pain and bodyaches as outlined in HPI above.  The triage note also indicates that the patient has been complaining of dizziness.  Upon further investigation and questioning the patient she reports that this is a symptom that has been going on for a long time and is not acute.  She reports that it just happens from time to time and she largely ignores it.  It is not a new symptom.  She denies headache, change in vision, nausea or vomiting, numbness, tingling, weakness in any of her extremities.  She also denies any URI symptoms or urinary symptoms.  She does have a history of multilevel degenerative disc disease.  Given that the dizziness is not a new symptom I do not feel that further workup is required at this time.  I will obtain a COVID and flu antigen test due to the fact the patient is experiencing bodyaches despite the fact that her respiratory exam is benign.  Respiratory antigen panel is negative for COVID or influenza.  I discussed with the patient that her respiratory panel was negative.  Given her history of multilevel degenerative disc disease I did assess her back by pressing on the spinous processes of her thoracic spine which did not reveal any tenderness.  She does have some bilateral paraspinous muscle tension and spasm that is tender to palpation.  I will treat the patient for musculoskeletal back pain given her multilevel degenerative disc disease.  I will treat her with 5 mg of baclofen  every 8 hours as needed for muscle spasm.  She may also use over-the-counter Tylenol .  If her symptoms do not improve using Tylenol  and baclofen  I will have her follow-up with either [facility] or Dr.  Clois.   Final Clinical Impressions(s) / UC Diagnoses   Final diagnoses:  Influenza-like  symptoms  Midline thoracic back pain, unspecified chronicity     Discharge Instructions      Continue to use over-the-counter Tylenol  according to the pack instructions to help you with the pain in your back.  Add the 5 mg of baclofen  every 8 hours to help with muscle spasm.  Applying moist heat to your back can help improve blood flow and aid in pain relief.  You may do this for 20 minutes at a time, 2-3 times a day, to help with pain and inflammation.  If you do not have any improvement of your pain, or your pain worsens, I would recommend following up with either [facility] here in [place] or in [place] or following up with Dr. Clois a neurosurgeon with [facility].  I have attached his contact information to your discharge instructions.       ED Prescriptions     Medication Sig Dispense Auth. Provider   Baclofen  5 MG TABS Take 1 tablet (5 mg total) by mouth 3 (three) times daily as needed. 30 tablet Bernardino Ditch, NP      PDMP not reviewed this encounter.     [1]  Social History Tobacco Use   Smoking status: Every Day    Current packs/day: 1.00    Average packs/day: 1 pack/day for 60.9 years (60.9 ttl pk-yrs)    Types: Cigarettes    Start date: 1965   Smokeless tobacco: Never   Tobacco comments:    patches and pills discussed   Vaping Use   Vaping status: Never Used  Substance Use Topics   Alcohol use: No   Drug use: No     Bernardino Ditch, NP 11/28/24 1404

## 2024-11-28 NOTE — ED Triage Notes (Addendum)
 Pt present dizziness with body aches, symptoms yesterday. Pt complain of Mid back pain. Pt took OTC medication with no relief.

## 2024-11-28 NOTE — Discharge Instructions (Addendum)
 Continue to use over-the-counter Tylenol  according to the pack instructions to help you with the pain in your back.  Add the 5 mg of baclofen  every 8 hours to help with muscle spasm.  Applying moist heat to your back can help improve blood flow and aid in pain relief.  You may do this for 20 minutes at a time, 2-3 times a day, to help with pain and inflammation.  If you do not have any improvement of your pain, or your pain worsens, I would recommend following up with either [facility] here in [place] or in [place] or following up with Dr. Clois a neurosurgeon with [facility] health.  I have attached his contact information to your discharge instructions.

## 2024-12-10 ENCOUNTER — Encounter: Payer: Self-pay | Admitting: Internal Medicine

## 2024-12-10 NOTE — Telephone Encounter (Signed)
-----   Message from Annah Skene, MD sent at 11/26/2024  9:30 AM EST ----- Please let her know that her iron  studies are okay.  B12 levels are low less than 300.  If she taking any oral B12?  If not she should take 1000 mcg of p.o. B12 daily.  If she is already taking p.o.  B12 and please ask her if she would like to switch to B12 injections ----- Message ----- From: Interface, Lab In [place] Sent: 11/25/2024   2:30 PM EST To: Annah JAYSON Skene, MD

## 2024-12-10 NOTE — Telephone Encounter (Signed)
 Spoke to patient regarding lab results. Patient stated she didn't want to misinterpret anything that I said & wanted me to call her daughter. I tried to reach out to her daughter & had to leave a message.   Patient will need to start taking B12 medication rather it is by mouth or injections.

## 2024-12-16 ENCOUNTER — Telehealth: Payer: Self-pay

## 2024-12-16 NOTE — Telephone Encounter (Signed)
 Voicemail received from daughter [name] today 12/16/24 at 11:48am asking about her mother's most recent test results.  Best call back number is [phone].    Note from 11/25/24 Please let her know that her iron  studies are okay. B12 levels are low less than 300. If she taking any oral B12? If not she should take 1000 mcg of p.o. B12 daily. If she is already taking p.o. B12 and please ask her if she would like to switch to B12 injections.  Outbound call; spoke to [place] and informed of above.  Daughter verbalized understanding.

## 2024-12-29 NOTE — Progress Notes (Unsigned)
" °  Cardiology Office Note   Date:  12/29/2024  ID:  Angelica Walker, DOB June 16, 1941, MRN [id number] PCP: Kotturi, Vinay K, MD  [facility] Providers Cardiologist:  None { Click to update primary MD,subspecialty MD or APP then REFRESH:1}    History of Present Illness Angelica Walker is a 84 y.o. female PMH HTN, HLD, MCI who presents for further evaluation and management of dizziness.  Seen by PCP for this issue 10/2024.  Reportedly has dizziness with standing.  Unclear if syncope present.  Last LDL 139 03/2023.  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  Studies Reviewed I have independently reviewed the patient's ECG, previous cardiac testing, previous CT scan, previous blood work, previous medical records.  Physical Exam VS:  There were no vitals taken for this visit.       Wt Readings from Last 3 Encounters:  11/28/24 115 lb (52.2 kg)  10/28/24 116 lb (52.6 kg)  10/09/24 113 lb (51.3 kg)    GEN: No acute distress. NECK: No JVD; No carotid bruits. CARDIAC: ***RRR, no murmurs, rubs, gallops. RESPIRATORY:  Clear to auscultation. EXTREMITIES:  Warm and well-perfused. No edema.  ASSESSMENT AND PLAN Near syncope Orthostasis CAC Aortic atherosclerosis HLD        {Are you ordering a CV Procedure (e.g. stress test, cath, DCCV, TEE, etc)?   Press F2        :[id number]}  Dispo: ***  Signed, Caron Poser, MD  "

## 2024-12-30 ENCOUNTER — Ambulatory Visit

## 2025-01-07 ENCOUNTER — Ambulatory Visit
Admission: EM | Admit: 2025-01-07 | Discharge: 2025-01-07 | Disposition: A | Discharge status: Home / Self Care | Attending: Physician Assistant | Admitting: Physician Assistant

## 2025-01-07 ENCOUNTER — Ambulatory Visit

## 2025-01-07 DIAGNOSIS — R10A1 Flank pain, right side: Secondary | ICD-10-CM

## 2025-01-07 DIAGNOSIS — J441 Chronic obstructive pulmonary disease with (acute) exacerbation: Secondary | ICD-10-CM

## 2025-01-07 DIAGNOSIS — R051 Acute cough: Secondary | ICD-10-CM

## 2025-01-07 DIAGNOSIS — R1024 Suprapubic pain: Secondary | ICD-10-CM

## 2025-01-07 LAB — POC COVID19/FLU A&B COMBO
Covid Antigen, POC: NEGATIVE
Influenza A Antigen, POC: NEGATIVE
Influenza B Antigen, POC: NEGATIVE

## 2025-01-07 LAB — POCT URINE DIPSTICK
Bilirubin, UA: NEGATIVE
Glucose, UA: NEGATIVE mg/dL
Ketones, POC UA: NEGATIVE mg/dL
Leukocytes, UA: NEGATIVE
Nitrite, UA: NEGATIVE
Protein Ur, POC: NEGATIVE mg/dL
Spec Grav, UA: 1.01
Urobilinogen, UA: 0.2 U/dL
pH, UA: 6

## 2025-01-07 MED ORDER — ALBUTEROL SULFATE HFA 108 (90 BASE) MCG/ACT IN AERS: 1.0000 | INHALATION_SPRAY | Freq: Four times a day (QID) | RESPIRATORY_TRACT | 0 refills | Status: AC | PRN

## 2025-01-07 MED ORDER — AMOXICILLIN-POT CLAVULANATE 875-125 MG PO TABS: 1.0000 | ORAL_TABLET | Freq: Two times a day (BID) | ORAL | 0 refills | Status: AC

## 2025-01-07 NOTE — ED Triage Notes (Signed)
 Patient to Urgent Care with complaints of body aches/ feeling run down. No fevers.   Symptoms started yesterday.   Tylenol  this morning.

## 2025-01-07 NOTE — ED Provider Notes (Signed)
 " [facility]    CSN: [id number] Arrival date & time: 01/07/25  1527      History   Chief Complaint Chief Complaint  Patient presents with   Generalized Body Aches    HPI Angelica Walker is a 84 y.o. female with history of COPD, GERD, hypertension, hyperlipidemia, restless leg syndrome, tobacco abuse, and prediabetes.  She presents today for feeling fatigued with body aches, right sided flank pain, right sided rib pain and bladder pressure since yesterday.  Patient was at this office 3 days ago but says she cannot read what she came for.  Previous complaint was cough but patient left without being seen by provider.  She says she is not really coughing so much now.  Denies fever, chest pain, shortness of breath, runny nose, sore throat, nausea/vomiting, diarrhea, constipation, painful urination, urgency, frequency.  Not taking OTC meds.  No other concerns.  HPI  Past Medical History:  Diagnosis Date   Anemia    COPD (chronic obstructive pulmonary disease) (HCC)    GERD (gastroesophageal reflux disease)    Hyperlipidemia    Hypertension    Multilevel degenerative disc disease    Serum lipids high     Patient Active Problem List   Diagnosis Date Noted   Iron  deficiency anemia due to chronic blood loss 07/02/2018   Smoker 03/18/2018   RLS (restless legs syndrome) 01/18/2018   Anemia due to blood loss 12/20/2017   Prediabetes 12/19/2017   Hyperlipidemia, unspecified 12/19/2017   Hypertension, essential, benign 12/19/2017   B12 deficiency 12/19/2017   DDD (degenerative disc disease), lumbar 12/19/2017   Multiple gastric ulcers 12/19/2017   COPD (chronic obstructive pulmonary disease) (HCC) 12/21/2014    Past Surgical History:  Procedure Laterality Date   CATARACT EXTRACTION W/PHACO Left 06/29/2020   Procedure: CATARACT EXTRACTION PHACO AND INTRAOCULAR LENS PLACEMENT (IOC) LEFT 7.20 00:42.7;  Surgeon: Myrna Adine Anes, MD;  Location: [facility];  Service:  Ophthalmology;  Laterality: Left;   CATARACT EXTRACTION W/PHACO Right 07/26/2020   Procedure: CATARACT EXTRACTION PHACO AND INTRAOCULAR LENS PLACEMENT (IOC) RIGHT 7.97  00:53.0;  Surgeon: Myrna Adine Anes, MD;  Location: [facility];  Service: Ophthalmology;  Laterality: Right;   COLONOSCOPY WITH PROPOFOL  N/A 12/07/2017   Procedure: COLONOSCOPY WITH PROPOFOL ;  Surgeon: Gaylyn Gladis PENNER, MD;  Location: [facility];  Service: Endoscopy;  Laterality: N/A;   ESOPHAGOGASTRODUODENOSCOPY (EGD) WITH PROPOFOL  N/A 12/07/2017   Procedure: ESOPHAGOGASTRODUODENOSCOPY (EGD) WITH PROPOFOL ;  Surgeon: Gaylyn Gladis PENNER, MD;  Location: [facility];  Service: Endoscopy;  Laterality: N/A;   ESOPHAGOGASTRODUODENOSCOPY (EGD) WITH PROPOFOL  N/A 03/21/2018   Procedure: ESOPHAGOGASTRODUODENOSCOPY (EGD) WITH PROPOFOL ;  Surgeon: Gaylyn Gladis PENNER, MD;  Location: [facility] END[facility];  Service: Endoscopy;  Laterality: N/A;   ESOPHAGOGASTRODUODENOSCOPY (EGD) WITH PROPOFOL  N/A 07/22/2018   Procedure: ESOPHAGOGASTRODUODENOSCOPY (EGD) WITH PROPOFOL ;  Surgeon: Gaylyn Gladis PENNER, MD;  Location: [facility] ENDOSCOPY;  Service: Endoscopy;  Laterality: N/A;    OB History   No obstetric history on file.      Home Medications    Prior to Admission medications  Medication Sig Start Date End Date Taking? Authorizing Provider  acetaminophen  (TYLENOL ) 500 MG tablet Take 2 tablets (1,000 mg total) by mouth 2 (two) times daily as needed. 09/15/24   Kotturi, Vinay K, MD  amoxicillin -clavulanate (AUGMENTIN ) 875-125 MG tablet Take 1 tablet by mouth every 12 (twelve) hours for 7 days. 01/07/25 01/14/25 Yes Arvis Jolan NOVAK, PA-C  atorvastatin  (LIPITOR) 10 MG tablet Take 1 tablet (10 mg  total) by mouth daily. 11/17/24   Kotturi, Vinay K, MD  Baclofen  5 MG TABS Take 1 tablet (5 mg total) by mouth 3 (three) times daily as needed. 11/28/24   Bernardino Ditch, NP  bismuth subsalicylate (PEPTO BISMOL) 262 MG/15ML suspension Take 30 mLs by mouth every  6 (six) hours as needed.    [provider]  Calcium  Carbonate-Vitamin D 600-200 MG-UNIT TABS Take by mouth.    [provider]  Cyanocobalamin  (B-12) 1000 MCG TABS Take 1 tablet by mouth daily. 03/19/18   Plonk, Elsie, MD  donepezil (ARICEPT) 5 MG tablet Take 5 mg by mouth at bedtime. 10/11/23   [provider]  Ferrous Sulfate  (IRON ) 28 MG TABS Take by mouth.    [provider]  GLUCOSAMINE-CHONDROITIN PO Take 3 tablets by mouth.    [provider]  lisinopril  (ZESTRIL ) 10 MG tablet Take 1 tablet (10 mg total) by mouth daily. 10/09/24   Kotturi, Vinay K, MD  Omega-3 Fatty Acids ([employer] FISH OIL) 1200 MG CPDR Take 720 mg by mouth daily.    [provider]  pantoprazole  (PROTONIX ) 40 MG tablet Take 1 tablet (40 mg total) by mouth daily. 09/08/24   Kotturi, Vinay K, MD  traZODone  (DESYREL ) 50 MG tablet Take 1 tablet (50 mg total) by mouth at bedtime. 09/08/24   Kotturi, Vinay K, MD    Family History Family History  Problem Relation Age of Onset   Other Mother        unknown medical history   Other Father        unknown medical history   Breast cancer Maternal Aunt     Social History Social History[1]   Allergies   Simvastatin   Review of Systems Review of Systems  Constitutional:  Positive for fatigue. Negative for chills, diaphoresis and fever.  HENT:  Negative for congestion, ear pain, rhinorrhea, sinus pain and sore throat.   Respiratory:  Positive for cough. Negative for shortness of breath.   Cardiovascular:  Negative for chest pain.  Gastrointestinal:  Positive for abdominal pain. Negative for nausea and vomiting.  Genitourinary:  Negative for difficulty urinating, dysuria and frequency.  Musculoskeletal:  Positive for myalgias.  Skin:  Negative for rash.  Neurological:  Positive for headaches. Negative for weakness.  Hematological:  Negative for adenopathy.     Physical Exam Triage Vital Signs ED Triage Vitals   Encounter Vitals Group     BP      Girls Systolic BP Percentile      Girls Diastolic BP Percentile      Boys Systolic BP Percentile      Boys Diastolic BP Percentile      Pulse      Resp      Temp      Temp src      SpO2      Weight      Height      Head Circumference      Peak Flow      Pain Score      Pain Loc      Pain Education      Exclude from Growth Chart    No data found.  Updated Vital Signs BP (!) 148/90   Pulse 80   Temp 98.2 F (36.8 C)   Resp 18   Wt 113 lb 3.2 oz (51.3 kg)   SpO2 99%   BMI 22.11 kg/m    Physical Exam Vitals and nursing note reviewed.  Constitutional:      General: She is not in acute distress.    Appearance: Normal appearance. She is not ill-appearing or toxic-appearing.  HENT:     Head: Normocephalic and atraumatic.     Nose: Congestion present.     Mouth/Throat:     Mouth: Mucous membranes are moist.     Pharynx: Oropharynx is clear.  Eyes:     General: No scleral icterus.       Right eye: No discharge.        Left eye: No discharge.     Conjunctiva/sclera: Conjunctivae normal.  Cardiovascular:     Rate and Rhythm: Normal rate and regular rhythm.     Heart sounds: Normal heart sounds.  Pulmonary:     Effort: Pulmonary effort is normal. No respiratory distress.     Breath sounds: Wheezing (throughout right lung fields) present.  Abdominal:     Palpations: Abdomen is soft.     Tenderness: There is abdominal tenderness (suprapubic and RUQ). There is no right CVA tenderness, left CVA tenderness or guarding.  Musculoskeletal:     Cervical back: Neck supple.  Skin:    General: Skin is dry.  Neurological:     General: No focal deficit present.     Mental Status: She is alert. Mental status is at baseline.     Motor: No weakness.     Gait: Gait normal.  Psychiatric:        Mood and Affect: Mood normal.        Behavior: Behavior normal.      UC Treatments / Results  Labs (all labs ordered are listed, but only  abnormal results are displayed) Labs Reviewed  POCT URINE DIPSTICK - Abnormal; Notable for the following components:      Result Value   Blood, UA trace-intact (*)    All other components within normal limits  POC COVID19/FLU A&B COMBO - Normal    EKG   Radiology DG Chest 2 View Result Date: 01/07/2025 CLINICAL DATA:  Cough a few days with right rib/flank pain and fatigue. EXAM: CHEST - 2 VIEW COMPARISON:  Chest CT 11/30/2022 FINDINGS: Lungs are adequately inflated without focal airspace consolidation or effusion. Cardiomediastinal silhouette is normal. Mild degenerative changes of the spine. IMPRESSION: No active cardiopulmonary disease. Electronically Signed   By: Toribio Agreste M.D.   On: 01/07/2025 16:59    Procedures Procedures (including critical care time)  Medications Ordered in UC Medications - No data to display  Initial Impression / Assessment and Plan / UC Course  I have reviewed the triage vital signs and the nursing notes.  Pertinent labs & imaging results that were available during my care of the patient were reviewed by me and considered in my medical decision making (see chart for details).   84 year old female with history of tobacco abuse, COPD, hypertension and hyperlipidemia presents for feeling ill since yesterday.  Reports body aches, fatigue, right lower back/flank pain, bladder pressure and cough.  Patient may have been coughing for the past 3 to 4 days.  She presented to this clinic on 1/18 with complaints of cough and flulike symptoms but left before being seen by provider due to wait times.  Blood pressure elevated 148/90.  She is afebrile.  Overall well-appearing.  No acute distress.  Slight nasal congestion.  Throat clear.  Scattered wheezes throughout right lung.  Abdomen soft with mild suprapubic and right upper quadrant tenderness.  No guarding or rebound.  No CVA tenderness.  Flu/COVID testing negative.  Urinalysis obtained.  Not consistent with  urinary tract infection.  Chest x-ray ordered.  Negative.  Patient presentation consistent with COPD exacerbation.  Sent Augmentin  and Promethazine DM to pharmacy.  Encouraged increasing rest and fluids.  Advised if she starts to have chest pain, shortness of breath, worsening abdominal pain, vomiting, constipation, black or bloody stools she should go immediately to the emergency department for further evaluation likely to include labs and possible CT imaging.  Patient is agreeable.   Final Clinical Impressions(s) / UC Diagnoses   Final diagnoses:  Right flank pain  Acute cough  Suprapubic pain  COPD exacerbation (HCC)     Discharge Instructions      - COVID and flu testing. -Urinalysis not consistent with a urinary tract infection. - Chest x-ray does not show pneumonia. - On exam you have diffuse wheezing throughout your right lung.  This is consistent with a COPD exacerbation.  I sent in antibiotics to the pharmacy for you.  Continue to use your inhalers.  Increase rest and fluids. - If your abdominal pain worsens, you develop fever, vomiting, black or bloody stool, chest pain, increased breathing difficulty please go to emergency department.     ED Prescriptions     Medication Sig Dispense Auth. Provider   amoxicillin -clavulanate (AUGMENTIN ) 875-125 MG tablet Take 1 tablet by mouth every 12 (twelve) hours for 7 days. 14 tablet Wayde Gopaul B, PA-C      PDMP not reviewed this encounter.     [1]  Social History Tobacco Use   Smoking status: Every Day    Current packs/day: 1.00    Average packs/day: 1 pack/day for 61.1 years (61.1 ttl pk-yrs)    Types: Cigarettes    Start date: 1965   Smokeless tobacco: Never   Tobacco comments:    patches and pills discussed   Vaping Use   Vaping status: Never Used  Substance Use Topics   Alcohol use: No   Drug use: No     Arvis Jolan NOVAK, PA-C 01/07/25 1729  "

## 2025-01-07 NOTE — Discharge Instructions (Addendum)
-   COVID and flu testing. -Urinalysis not consistent with a urinary tract infection. - Chest x-ray does not show pneumonia. - On exam you have diffuse wheezing throughout your right lung.  This is consistent with a COPD exacerbation.  I sent in antibiotics to the pharmacy for you.  Continue to use your inhalers.  Increase rest and fluids. - If your abdominal pain worsens, you develop fever, vomiting, black or bloody stool, chest pain, increased breathing difficulty please go to emergency department.

## 2025-01-22 ENCOUNTER — Other Ambulatory Visit: Payer: Self-pay | Admitting: Family Medicine

## 2025-01-23 NOTE — Telephone Encounter (Signed)
 Requested Prescriptions  Pending Prescriptions Disp Refills   pantoprazole  (PROTONIX ) 40 MG tablet [Pharmacy Med Name: PANTOPRAZOLE  SODIUM 40MG  TABLET DR] 30 tablet 0    Sig: TAKE ONE (1) TABLET (40 MG TOTAL) BY MOUTH DAILY.     Gastroenterology: Proton Pump Inhibitors Passed - 01/23/2025  3:51 PM      Passed - Valid encounter within last 12 months    Recent Outpatient Visits           2 months ago Dizziness   [facility] at [facility], Vinay K, MD   3 months ago [facility] stool   [facility] at [facility], Vinay K, MD   4 months ago Neck pain   [facility] at [facility], Vinay K, MD   7 months ago Neck pain   [facility] at [facility], Vinay K, MD   10 months ago Hypertension, essential, benign   [facility] at [facility] Cathryne JAYSON, MD

## 2025-02-16 ENCOUNTER — Other Ambulatory Visit

## 2025-02-16 ENCOUNTER — Ambulatory Visit: Admitting: Oncology

## 2025-02-23 ENCOUNTER — Other Ambulatory Visit

## 2025-02-23 ENCOUNTER — Ambulatory Visit: Admitting: Oncology

## 2025-03-16 ENCOUNTER — Ambulatory Visit: Admitting: Family Medicine

## 2025-09-23 ENCOUNTER — Ambulatory Visit
